# Patient Record
Sex: Female | Born: 1940 | Race: Black or African American | Hispanic: No
Health system: Southern US, Community
[De-identification: ages and names within clinical notes are randomized; demographics above are authoritative.]

## PROBLEM LIST (undated history)

## (undated) DIAGNOSIS — I10 Essential (primary) hypertension: Secondary | ICD-10-CM

## (undated) DIAGNOSIS — R262 Difficulty in walking, not elsewhere classified: Secondary | ICD-10-CM

## (undated) DIAGNOSIS — M1990 Unspecified osteoarthritis, unspecified site: Secondary | ICD-10-CM

## (undated) DIAGNOSIS — M4800 Spinal stenosis, site unspecified: Secondary | ICD-10-CM

---

## 2003-06-23 ENCOUNTER — Ambulatory Visit (HOSPITAL_COMMUNITY): Admission: RE | Admit: 2003-06-23 | Discharge: 2003-06-23 | Payer: Self-pay | Admitting: Geriatric Medicine

## 2005-01-02 ENCOUNTER — Ambulatory Visit (HOSPITAL_COMMUNITY): Admission: RE | Admit: 2005-01-02 | Discharge: 2005-01-02 | Payer: Self-pay | Admitting: Neurosurgery

## 2005-06-02 ENCOUNTER — Emergency Department (HOSPITAL_COMMUNITY): Admission: EM | Admit: 2005-06-02 | Discharge: 2005-06-02 | Payer: Self-pay | Admitting: Family Medicine

## 2005-06-04 ENCOUNTER — Emergency Department (HOSPITAL_COMMUNITY): Admission: EM | Admit: 2005-06-04 | Discharge: 2005-06-04 | Payer: Self-pay | Admitting: Emergency Medicine

## 2010-06-24 ENCOUNTER — Encounter: Admission: RE | Admit: 2010-06-24 | Discharge: 2010-06-24 | Payer: Self-pay | Admitting: Geriatric Medicine

## 2014-04-27 ENCOUNTER — Other Ambulatory Visit: Payer: Self-pay

## 2014-04-27 DIAGNOSIS — Z1231 Encounter for screening mammogram for malignant neoplasm of breast: Secondary | ICD-10-CM

## 2014-05-09 ENCOUNTER — Ambulatory Visit
Admission: RE | Admit: 2014-05-09 | Discharge: 2014-05-09 | Disposition: A | Discharge status: Home / Self Care | Payer: Medicare PPO | Source: Ambulatory Visit

## 2014-05-09 DIAGNOSIS — Z1231 Encounter for screening mammogram for malignant neoplasm of breast: Secondary | ICD-10-CM

## 2014-05-11 ENCOUNTER — Other Ambulatory Visit: Payer: Self-pay | Admitting: Geriatric Medicine

## 2014-05-11 DIAGNOSIS — R928 Other abnormal and inconclusive findings on diagnostic imaging of breast: Secondary | ICD-10-CM

## 2014-05-18 ENCOUNTER — Ambulatory Visit
Admission: RE | Admit: 2014-05-18 | Discharge: 2014-05-18 | Disposition: A | Discharge status: Home / Self Care | Payer: Medicare PPO | Source: Ambulatory Visit | Attending: Geriatric Medicine | Admitting: Geriatric Medicine

## 2014-05-18 DIAGNOSIS — R928 Other abnormal and inconclusive findings on diagnostic imaging of breast: Secondary | ICD-10-CM

## 2014-10-12 ENCOUNTER — Other Ambulatory Visit: Payer: Self-pay | Admitting: Geriatric Medicine

## 2014-10-12 DIAGNOSIS — R921 Mammographic calcification found on diagnostic imaging of breast: Secondary | ICD-10-CM

## 2014-12-29 ENCOUNTER — Ambulatory Visit
Admission: RE | Admit: 2014-12-29 | Discharge: 2014-12-29 | Disposition: A | Discharge status: Home / Self Care | Payer: Medicare PPO | Source: Ambulatory Visit | Attending: Geriatric Medicine | Admitting: Geriatric Medicine

## 2014-12-29 DIAGNOSIS — R921 Mammographic calcification found on diagnostic imaging of breast: Secondary | ICD-10-CM

## 2016-01-15 ENCOUNTER — Other Ambulatory Visit: Payer: Self-pay

## 2016-01-15 DIAGNOSIS — Z1231 Encounter for screening mammogram for malignant neoplasm of breast: Secondary | ICD-10-CM

## 2016-02-18 ENCOUNTER — Ambulatory Visit
Admission: RE | Admit: 2016-02-18 | Discharge: 2016-02-18 | Disposition: A | Discharge status: Home / Self Care | Payer: Medicare PPO | Source: Ambulatory Visit

## 2016-02-18 DIAGNOSIS — Z1231 Encounter for screening mammogram for malignant neoplasm of breast: Secondary | ICD-10-CM

## 2017-02-20 ENCOUNTER — Other Ambulatory Visit: Payer: Self-pay | Admitting: Geriatric Medicine

## 2017-02-20 DIAGNOSIS — Z1231 Encounter for screening mammogram for malignant neoplasm of breast: Secondary | ICD-10-CM

## 2017-03-20 ENCOUNTER — Ambulatory Visit: Payer: Medicare Other

## 2017-04-03 ENCOUNTER — Ambulatory Visit
Admission: RE | Admit: 2017-04-03 | Discharge: 2017-04-03 | Disposition: A | Discharge status: Home / Self Care | Payer: Medicare Other | Source: Ambulatory Visit | Attending: Geriatric Medicine | Admitting: Geriatric Medicine

## 2017-04-03 DIAGNOSIS — Z1231 Encounter for screening mammogram for malignant neoplasm of breast: Secondary | ICD-10-CM

## 2017-09-23 DIAGNOSIS — M792 Neuralgia and neuritis, unspecified: Secondary | ICD-10-CM | POA: Diagnosis present

## 2017-09-23 DIAGNOSIS — Z981 Arthrodesis status: Secondary | ICD-10-CM

## 2018-02-23 ENCOUNTER — Ambulatory Visit: Payer: Medicare Other | Attending: Internal Medicine

## 2018-02-23 ENCOUNTER — Other Ambulatory Visit: Payer: Self-pay

## 2018-02-23 DIAGNOSIS — G8929 Other chronic pain: Secondary | ICD-10-CM | POA: Diagnosis present

## 2018-02-23 DIAGNOSIS — M25662 Stiffness of left knee, not elsewhere classified: Secondary | ICD-10-CM

## 2018-02-23 DIAGNOSIS — M25511 Pain in right shoulder: Secondary | ICD-10-CM | POA: Insufficient documentation

## 2018-02-23 DIAGNOSIS — M545 Low back pain: Secondary | ICD-10-CM | POA: Insufficient documentation

## 2018-02-23 DIAGNOSIS — M6281 Muscle weakness (generalized): Secondary | ICD-10-CM

## 2018-02-23 DIAGNOSIS — M25661 Stiffness of right knee, not elsewhere classified: Secondary | ICD-10-CM

## 2018-02-23 DIAGNOSIS — E669 Obesity, unspecified: Secondary | ICD-10-CM

## 2018-02-23 DIAGNOSIS — M25512 Pain in left shoulder: Secondary | ICD-10-CM | POA: Diagnosis present

## 2018-02-23 NOTE — Therapy (Signed)
[facility] [address] [place], [place], [address] Phone: [phone]   Fax:  [phone]  Physical Therapy Evaluation  Patient Details  Name: Kathleen [name] MRN: [id number] Date of Birth: [birth year] Referring Provider: Eloisa Northern, MD   Encounter Date: 02/23/2018  PT End of Session - 02/23/18 1743    Visit Number  1    Number of Visits  25    Date for PT Re-Evaluation  05/07/18    Authorization Type  [employer] MCR State PLan    PT Start Time  0220    PT Stop Time  0305    PT Time Calculation (min)  45 min    Activity Tolerance  Treatment limited secondary to medical complications (Comment);No increased pain    Behavior During Therapy  [facility] for tasks assessed/performed       No past medical history on file.    There were no vitals filed for this visit.   Subjective Assessment - 02/23/18 1426    Subjective  Reports decompression and spinal fusion. (11/10/2017).   [facility] for surgery.  SNF for REHAB.   Exhausted insurance for skilled SNF surgery., She has not had therapy for a month. Approved for OPPT.    She was not able to walk at facility.  she was able to stand but not walk . Now can get up in bars Indep.  Doing sliding board stransfer now.       Prior to surgery she was not able to walk after 07/2017.   She was using walker priot to that for 10-15 years.     Patient is accompained by:  Family member Daughter    Pertinent History  2014 spinal surgery due to spinal stenosis.  She was living by self withw alker prior to Nov 2018. Shoulder OA , knee OA, Hand tingles    Limitations  Standing;Walking;House hold activities transfers    How long can you sit comfortably?  all day    How long can you stand comfortably?  1 min with SBA    How long can you walk comfortably?  Not able     Patient Stated Goals  to be able to walk again.     Currently in Pain?  Yes no back pain . Pain in legs and feet.     Pain Location  -- LE     Pain  Orientation  Right;Left    Pain Descriptors / Indicators  Tingling    Pain Type  Chronic pain    Pain Radiating Towards  both lateral LE    Pain Onset  More than a month ago    Pain Frequency  Constant    Aggravating Factors   comes and goes.          [facility] PT Assessment - 02/23/18 0001      Assessment   Medical Diagnosis  Post spinal decompression    Referring Provider  Eloisa Northern, MD    Onset Date/Surgical Date  11/12/17    Next MD Visit  05/2018    Prior Therapy  SNF      Precautions   Precautions  Fall      Restrictions   Weight Bearing Restrictions  No      Balance Screen   Has the patient fallen in the past 6 months  No    Has the patient had a decrease in activity level because of a fear of falling?   Yes  Home Environment   Living Environment  Skilled nursing facility      Prior Function   Level of Independence  Needs assistance with ADLs;Requires assistive device for independence;Needs assistance with homemaking;Needs assistance with gait;Needs assistance with transfers      Cognition   Overall Cognitive Status  Within Functional Limits for tasks assessed      ROM / Strength   AROM / PROM / Strength  AROM;Strength;PROM      AROM   Overall AROM Comments   LE movements are less than available ROM with hip motions , knee and ankle able in available ROm ,   all hip and knee ( knee flexion < 90 degrees bilaterally) motions limited by stiffess.  Poor core strength as she is not able to hold herself in erect sitting partly due to abdominal girth.       Strength   Overall Strength Comments  weakness in both LE ( hip < 3+/5 and knees and ankle 4/5)  and core . Shoulders WNL but limtied by stiffness to to OA      Transfers   Comments  Max assist of one for sliding board treansfer to and from [facility] and to scoot back into chair      Ambulation/Gait   Ambulation/Gait  No    Gait Comments  UNABLE      Wheelchair Mobility   Wheelchair Mobility  No      Balance    Balance Assessed  -- has trouble sitting erect without back support                Objective measurements completed on examination: See above findings.              PT Education - 02/23/18 1743    Education provided  Yes    Education Details  POC    Person(s) Educated  Patient;Child(ren)    Methods  Explanation    Comprehension  Verbalized understanding       PT Short Term Goals - 02/23/18 1753      PT SHORT TERM GOAL #1   Title  She will be independent in initial HEP    Time  4    Period  Weeks    Status  New      PT SHORT TERM GOAL #2   Title  She will be able to transfer with board with min to no assistance    Time  4    Period  Weeks    Status  New      PT SHORT TERM GOAL #3   Title  She will be able to pull herself to stand in parallel bars    Time  4    Period  Weeks    Status  New      PT SHORT TERM GOAL #4   Title  She will be able to take 10 step in bars with min to mod assist.     Time  4    Period  Weeks    Status  New      PT SHORT TERM GOAL #5   Title  She will be able to stand for 5 min  in bars    Time  4    Period  Weeks    Status  New      Additional Short Term Goals   Additional Short Term Goals  Yes      PT SHORT TERM GOAL #6   Title  She will have no increase in leg pain    Time  4    Period  Weeks    Status  New        PT Long Term Goals - 02/23/18 1755      PT LONG TERM GOAL #1   Title  She will be able to stand to walker with minor assist of one    Time  10    Period  Weeks    Status  New      PT LONG TERM GOAL #2   Title  She will be able to walk 50 feet with RW for home ambulation with +1 contact guard assist.     Time  10    Period  Weeks    Status  New      PT LONG TERM GOAL #3   Title  She will be able to stand to walker with +1 min assist from sheel chair.     Time  10    Period  Weeks    Status  New      PT LONG TERM GOAL #4   Title  She will be able to get herself in and out of bed with  +1 min assist     Time  10    Period  Weeks    Status  New      PT LONG TERM GOAL #5   Title  she will report no increase in leg pain    Time  10    Period  Weeks    Status  New             Plan - 02/23/18 1744    Clinical Impression Statement  Ms Washing presents post Lumbar decompression and fusion due to spinal stenosis. She has progressed to sliding board stransfer and participates in mobility but still needs assist for all aspects of daily living. She is extremely obese and lives in  skilled facility. She does get assistance from her children.    Due to multipl comorbidities the prognosis fro walking functionally is fair to poor but she has made gains since surgery she should still make some progress in mobility     History and Personal Factors relevant to plan of care:  Spinal fusion 10 years ago, was walking with walker for past 10 years or more. significant OA in both knees and shoulders, chronic bilateral leg pain to feet.  She recieved assistance for home tasks prior to surgery and was not able to walk after 07/2017.     Clinical Presentation  Evolving    Clinical Presentation due to:  imobility following spinal surgery    Clinical Decision Making  Moderate    Rehab Potential  Fair    Clinical Impairments Affecting Rehab Potential  obesity , previous back surgery, significant LE weakness and stiffness of joints.  significnat core weakness    PT Frequency  3x / week    PT Duration  4 weeks then 2x/week for 6 weeks and assess progress for exension    PT Treatment/Interventions  Moist Heat;Therapeutic activities;Patient/family education;Functional mobility training;Therapeutic exercise;Gait training;Manual techniques;Wheelchair mobility training;Passive range of motion;DME Instruction;ADLs/Self Care Home Management    PT Next Visit Plan  Attempt stand at parallel bars, Assess mat mobility. REview prevoious HEP if they have this. Start HEP    Consulted and Agree with Plan of Care   Patient;Family member/caregiver    Family Member Consulted  daughter  Patient will benefit from skilled therapeutic intervention in order to improve the following deficits and impairments:  Pain, Postural dysfunction, Decreased activity tolerance, Decreased endurance, Decreased range of motion, Decreased strength, Decreased balance  Visit Diagnosis: Muscle weakness (generalized)  Obesity, unspecified classification, unspecified obesity type, unspecified whether serious comorbidity present  Stiffness of left knee, not elsewhere classified  Stiffness of right knee, not elsewhere classified  Chronic bilateral low back pain, with sciatica presence unspecified  Chronic right shoulder pain  Left shoulder pain, unspecified chronicity     Problem List There are no active problems to display for this patient.   Caprice Red  PT 02/23/2018, 6:15 PM  [facility] [address] [place], [place], [address] Phone: [phone]   Fax:  [phone]  Name: Kathleen [name] MRN: [id number] Date of Birth: 24-Jul-1941

## 2018-03-01 ENCOUNTER — Ambulatory Visit: Payer: Medicare Other

## 2018-03-02 ENCOUNTER — Ambulatory Visit: Payer: Medicare Other | Attending: Internal Medicine

## 2018-03-02 DIAGNOSIS — M545 Low back pain: Secondary | ICD-10-CM | POA: Insufficient documentation

## 2018-03-02 DIAGNOSIS — M25511 Pain in right shoulder: Secondary | ICD-10-CM | POA: Diagnosis present

## 2018-03-02 DIAGNOSIS — G8929 Other chronic pain: Secondary | ICD-10-CM | POA: Diagnosis present

## 2018-03-02 DIAGNOSIS — M25661 Stiffness of right knee, not elsewhere classified: Secondary | ICD-10-CM | POA: Insufficient documentation

## 2018-03-02 DIAGNOSIS — M6281 Muscle weakness (generalized): Secondary | ICD-10-CM | POA: Diagnosis present

## 2018-03-02 DIAGNOSIS — M25512 Pain in left shoulder: Secondary | ICD-10-CM | POA: Diagnosis present

## 2018-03-02 DIAGNOSIS — E669 Obesity, unspecified: Secondary | ICD-10-CM | POA: Diagnosis present

## 2018-03-02 DIAGNOSIS — M25662 Stiffness of left knee, not elsewhere classified: Secondary | ICD-10-CM | POA: Diagnosis present

## 2018-03-02 NOTE — Therapy (Signed)
[facility] [address] [place], [place], [address] Phone: [phone]   Fax:  [phone]  Physical Therapy Treatment  Patient Details  Name: Kathleen CottonLana [name] MRN: [id number] Date of Birth: 05/28/1941 Referring Provider: Eloisa NorthernSaad Amin, MD   Encounter Date: 03/02/2018  PT End of Session - 03/02/18 1102    Visit Number  2    Number of Visits  25    Date for PT Re-Evaluation  05/07/18    Authorization Type  UHC MCR State PLan    PT Start Time  1100    PT Stop Time  1140    PT Time Calculation (min)  40 min    Activity Tolerance  Treatment limited secondary to medical complications (Comment);No increased pain    Behavior During Therapy  [facility] assessed/performed       History reviewed. No pertinent past medical history.  History reviewed. No pertinent surgical history.  There were no vitals filed for this visit.  Subjective Assessment - 03/02/18 1109    Subjective  No changes since eval.  Feet and legs bilaterally    Patient is accompained by:  Family member son    Currently in Pain?  Yes    Pain Score  4     Pain Location  -- legs and feet    Pain Orientation  Right;Left    Pain Descriptors / Indicators  Tingling    Pain Type  Chronic pain    Pain Onset  More than a month ago    Pain Frequency  Constant                       [facility] Adult PT Treatment/Exercise - 03/02/18 0001      Ambulation/Gait   Pre-Gait Activities  Worked in parallel bars stood with +2 min assist with pulling on bars and stood 50 sec.  Then rested 2 min and stood a second time with lifting /moving hands forward and back x 10 each  in bars,,   Then stood and walked with +1 mod assist /guard 3 feet with cues to keep back erect. She was not able to do this ie both activity together.   then stood with erect posture with lifting feet alt off floor.   (Pended)       We got out of bars and with +2 mod to max asssit she stood and walked  2-3 short steps with walker and +2 assist as she was not able to stand erect and advance feet. She needed +2 assist to descend to seat.          PT Short Term Goals - 02/23/18 1753      PT SHORT TERM GOAL #1   Title  She will be independent in initial HEP    Time  4    Period  Weeks    Status  New      PT SHORT TERM GOAL #2   Title  She will be able to transfer with board with min to no assistance    Time  4    Period  Weeks    Status  New      PT SHORT TERM GOAL #3   Title  She will be able to pull herself to stand in parallel bars    Time  4    Period  Weeks    Status  New      PT SHORT TERM GOAL #4  Title  She will be able to take 10 step in bars with min to mod assist.     Time  4    Period  Weeks    Status  New      PT SHORT TERM GOAL #5   Title  She will be able to stand for 5 min  in bars    Time  4    Period  Weeks    Status  New      Additional Short Term Goals   Additional Short Term Goals  Yes      PT SHORT TERM GOAL #6   Title  She will have no increase in leg pain    Time  4    Period  Weeks    Status  New        PT Long Term Goals - 02/23/18 1755      PT LONG TERM GOAL #1   Title  She will be able to stand to walker with minor assist of one    Time  10    Period  Weeks    Status  New      PT LONG TERM GOAL #2   Title  She will be able to walk 50 feet with RW for home ambulation with +1 contact guard assist.     Time  10    Period  Weeks    Status  New      PT LONG TERM GOAL #3   Title  She will be able to stand to walker with +1 min assist from sheel chair.     Time  10    Period  Weeks    Status  New      PT LONG TERM GOAL #4   Title  She will be able to get herself in and out of bed with +1 min assist     Time  10    Period  Weeks    Status  New      PT LONG TERM GOAL #5   Title  she will report no increase in leg pain    Time  10    Period  Weeks    Status  New            Plan - 03/02/18 1103    Clinical  Impression Statement  Though she is very far from independent ambulation , she did better than expected with walking able to max 5 feet in bars. Her posture and back weakness limit walking and her inability to pull feet under thighs will significantly decrease chance of standing from chair without max assist .     PT Treatment/Interventions  Moist Heat;Therapeutic activities;Patient/family education;Functional mobility training;Therapeutic exercise;Gait training;Manual techniques;Wheelchair mobility training;Passive range of motion;DME Instruction;ADLs/Self Care Home Management    PT Next Visit Plan  parallel bars walkoing and standing and exercise or mat exercies     Consulted and Agree with Plan of Care  Patient;Family member/caregiver    Family Member Consulted  son       Patient will benefit from skilled therapeutic intervention in order to improve the following deficits and impairments:  Pain, Postural dysfunction, Decreased activity tolerance, Decreased endurance, Decreased range of motion, Decreased strength, Decreased balance  Visit Diagnosis: Muscle weakness (generalized)  Obesity, unspecified classification, unspecified obesity type, unspecified whether serious comorbidity present  Stiffness of left knee, not elsewhere classified  Stiffness of right knee, not elsewhere classified  Chronic bilateral low back  pain, with sciatica presence unspecified  Chronic right shoulder pain  Left shoulder pain, unspecified chronicity     Problem List There are no active problems to display for this patient.   Caprice Red  PT 03/02/2018, 1:01 PM  [facility] [address] [place], [place], [address] Phone: [phone]   Fax:  [phone]  Name: Kathleen [name] MRN: [id number] Date of Birth: 1941/04/09

## 2018-03-03 ENCOUNTER — Encounter: Payer: Self-pay | Admitting: Physical Therapy

## 2018-03-03 ENCOUNTER — Ambulatory Visit: Payer: Medicare Other | Admitting: Physical Therapy

## 2018-03-03 DIAGNOSIS — M6281 Muscle weakness (generalized): Secondary | ICD-10-CM | POA: Diagnosis not present

## 2018-03-03 DIAGNOSIS — G8929 Other chronic pain: Secondary | ICD-10-CM

## 2018-03-03 DIAGNOSIS — M25511 Pain in right shoulder: Secondary | ICD-10-CM

## 2018-03-03 DIAGNOSIS — E669 Obesity, unspecified: Secondary | ICD-10-CM

## 2018-03-03 DIAGNOSIS — M25661 Stiffness of right knee, not elsewhere classified: Secondary | ICD-10-CM

## 2018-03-03 DIAGNOSIS — M545 Low back pain: Secondary | ICD-10-CM

## 2018-03-03 DIAGNOSIS — M25662 Stiffness of left knee, not elsewhere classified: Secondary | ICD-10-CM

## 2018-03-03 NOTE — Therapy (Addendum)
[facility] [address] [place], [place], [address] Phone: [phone]   Fax:  [phone]  Physical Therapy Treatment  Patient Details  Name: Kathleen [name] MRN: [id number] Date of Birth: 09/20/1941 Referring Provider: Eloisa NorthernSaad Amin, MD   Encounter Date: 03/03/2018  PT End of Session - 03/03/18 1458    Visit Number  3    Number of Visits  25    Date for PT Re-Evaluation  05/07/18    Authorization Type  UHC MCR State PLan    PT Start Time  1417    PT Stop Time  1457    PT Time Calculation (min)  40 min    Equipment Utilized During Treatment  Gait belt    Activity Tolerance  Patient tolerated treatment well    Behavior During Therapy  [facility] for tasks assessed/performed       History reviewed. No pertinent past medical history.  History reviewed. No pertinent surgical history.  There were no vitals filed for this visit.  Subjective Assessment - 03/03/18 1423    Subjective  "still having about the same as the other day"     Currently in Pain?  Yes    Pain Score  4                        [facility] Adult PT Treatment/Exercise - 03/03/18 1425      Lumbar Exercises: Standing   Other Standing Lumbar Exercises  standing in parallel bars 3 x 2 min  with 4 inch step in chair     Other Standing Lumbar Exercises  standing glute set 1 x 10 in //      Knee/Hip Exercises: Seated   Long Arc Quad  2 sets;10 reps    Long Arc Quad Weight  2 lbs.    Other Seated Knee/Hip Exercises  3 x 10 with red theraband pulling ankles apart    Marching  2 sets;10 reps 2 #    Sit to [employer]  2 sets;5 reps             PT Education - 03/03/18 1458    Education provided  Yes    Education Details  reviewed proper form with getting up and out of a chair using rocking    Person(s) Educated  Patient    Methods  Explanation;Verbal cues;Handout    Comprehension  Verbalized understanding;Verbal cues required       PT Short Term Goals -  02/23/18 1753      PT SHORT TERM GOAL #1   Title  She will be independent in initial HEP    Time  4    Period  Weeks    Status  New      PT SHORT TERM GOAL #2   Title  She will be able to transfer with board with min to no assistance    Time  4    Period  Weeks    Status  New      PT SHORT TERM GOAL #3   Title  She will be able to pull herself to stand in parallel bars    Time  4    Period  Weeks    Status  New      PT SHORT TERM GOAL #4   Title  She will be able to take 10 step in bars with min to mod assist.     Time  4    Period  Weeks    Status  New      PT SHORT TERM GOAL #5   Title  She will be able to stand for 5 min  in bars    Time  4    Period  Weeks    Status  New      Additional Short Term Goals   Additional Short Term Goals  Yes      PT SHORT TERM GOAL #6   Title  She will have no increase in leg pain    Time  4    Period  Weeks    Status  New        PT Long Term Goals - 02/23/18 1755      PT LONG TERM GOAL #1   Title  She will be able to stand to walker with minor assist of one    Time  10    Period  Weeks    Status  New      PT LONG TERM GOAL #2   Title  She will be able to walk 50 feet with RW for home ambulation with +1 contact guard assist.     Time  10    Period  Weeks    Status  New      PT LONG TERM GOAL #3   Title  She will be able to stand to walker with +1 min assist from sheel chair.     Time  10    Period  Weeks    Status  New      PT LONG TERM GOAL #4   Title  She will be able to get herself in and out of bed with +1 min assist     Time  10    Period  Weeks    Status  New      PT LONG TERM GOAL #5   Title  she will report no increase in leg pain    Time  10    Period  Weeks    Status  New            Plan - 03/03/18 1459    Clinical Impression Statement  pt reported no changes from yesterday. worked on seated strengthening which she required tactile cues for proper exercise form. practiced sit to stand in //  which she perfoemd +2 max assist. pt was only able to stand in // bars and fatigues quickly. uitlized 4 inch step in w/c to promote ease of sit to stand transition. no changes in pain noted at end of session.     PT Next Visit Plan  parallel bars walkoing and standing and exercise or mat exercies     Consulted and Agree with Plan of Care  Patient;Family member/caregiver    Family Member Consulted  son       Patient will benefit from skilled therapeutic intervention in order to improve the following deficits and impairments:  Pain, Postural dysfunction, Decreased activity tolerance, Decreased endurance, Decreased range of motion, Decreased strength, Decreased balance  Visit Diagnosis: Muscle weakness (generalized)  Obesity, unspecified classification, unspecified obesity type, unspecified whether serious comorbidity present  Stiffness of left knee, not elsewhere classified  Stiffness of right knee, not elsewhere classified  Chronic bilateral low back pain, with sciatica presence unspecified  Chronic right shoulder pain     Problem List There are no active problems to display for this patient.  Janann Boeve PT, DPT, LAT, ATC  03/03/18  4:08  PM      [facility] [address] [place], [place], [address] Phone: [phone]   Fax:  [phone]  Name: Kathleen [name] MRN: [id number] Date of Birth: 07-Jan-1941

## 2018-03-03 NOTE — Patient Instructions (Signed)
Perform seated rocking forward and back 2 x 10  Perform at least 1 x a day.

## 2018-03-08 ENCOUNTER — Ambulatory Visit: Payer: Medicare Other

## 2018-03-08 DIAGNOSIS — M25661 Stiffness of right knee, not elsewhere classified: Secondary | ICD-10-CM

## 2018-03-08 DIAGNOSIS — M25512 Pain in left shoulder: Secondary | ICD-10-CM

## 2018-03-08 DIAGNOSIS — M25511 Pain in right shoulder: Secondary | ICD-10-CM

## 2018-03-08 DIAGNOSIS — M6281 Muscle weakness (generalized): Secondary | ICD-10-CM

## 2018-03-08 DIAGNOSIS — E669 Obesity, unspecified: Secondary | ICD-10-CM

## 2018-03-08 DIAGNOSIS — M25662 Stiffness of left knee, not elsewhere classified: Secondary | ICD-10-CM

## 2018-03-08 DIAGNOSIS — M545 Low back pain: Secondary | ICD-10-CM

## 2018-03-08 DIAGNOSIS — G8929 Other chronic pain: Secondary | ICD-10-CM

## 2018-03-08 NOTE — Therapy (Signed)
[facility] [address] [place], [place], [address] Phone: [phone]   Fax:  [phone]  Physical Therapy Treatment  Patient Details  Name: Kathleen CottonLana [name] MRN: [id number] Date of Birth: 02/07/1941 Referring Provider: Eloisa NorthernSaad Amin, MD   Encounter Date: 03/08/2018  PT End of Session - 03/08/18 1417    Visit Number  4    Number of Visits  25    Date for PT Re-Evaluation  05/07/18    Authorization Type  [employer] MCR State PLan    PT Start Time  1230    PT Stop Time  1315    PT Time Calculation (min)  45 min    Equipment Utilized During Treatment  Gait belt    Activity Tolerance  Patient tolerated treatment well    Behavior During Therapy  [facility]/per[facility]       History reviewed. No pertinent past medical history.  History reviewed. No pertinent surgical history.  There were no vitals filed for this visit.  Subjective Assessment - 03/08/18 1340    Subjective  No changes    Pain Score  4     Pain Location  -- legs and feet    Pain Orientation  Right;Left    Pain Descriptors / Indicators  Tingling    Pain Type  Chronic pain    Pain Onset  More than a month ago    Pain Frequency  Constant                       [facility] Adult PT Treatment/Exercise - 03/08/18 0001      Lumbar Exercises: Standing   Other Standing Lumbar Exercises  standing in parallel bars 3 x 1 min  3 inch foam in seat   needed onlt +! assist  then walked 10 feet x 2 in bars with +! min assist and cued for posture and effort. She then stood and worked on weight shift RT and LT  with good posture/      Other Standing Lumbar Exercises  glut squeeze and back extension               PT Short Term Goals - 03/08/18 1419      PT SHORT TERM GOAL #1   Title  She will be independent in initial HEP    Status  On-going      PT SHORT TERM GOAL #2   Title  She will be able to transfer with board with min to no assistance    Status   On-going      PT SHORT TERM GOAL #3   Title  She will be able to pull herself to stand in parallel bars    Status  Achieved      PT SHORT TERM GOAL #4   Title  She will be able to take 10 step in bars with min to mod assist.     Status  Achieved      PT SHORT TERM GOAL #5   Title  She will be able to stand for 5 min  in bars    Status  On-going      PT SHORT TERM GOAL #6   Title  She will have no increase in leg pain    Status  On-going        PT Long Term Goals - 02/23/18 1755      PT LONG TERM GOAL #1   Title  She  will be able to stand to walker with minor assist of one    Time  10    Period  Weeks    Status  New      PT LONG TERM GOAL #2   Title  She will be able to walk 50 feet with RW for home ambulation with +1 contact guard assist.     Time  10    Period  Weeks    Status  New      PT LONG TERM GOAL #3   Title  She will be able to stand to walker with +1 min assist from sheel chair.     Time  10    Period  Weeks    Status  New      PT LONG TERM GOAL #4   Title  She will be able to get herself in and out of bed with +1 min assist     Time  10    Period  Weeks    Status  New      PT LONG TERM GOAL #5   Title  she will report no increase in leg pain    Time  10    Period  Weeks    Status  New            Plan - 03/08/18 1343    Clinical Impression Statement  Elevation of seat aides in sit to stand . She appeared to stand more erect for longer period today. Inmproved walking. Weakness on Rt side makes moving LT leg forward difficult    Clinical Impairments Affecting Rehab Potential  obesity , previous back surgery, significant LE weakness and stiffness of joints.  significnat core weakness    PT Treatment/Interventions  Moist Heat;Therapeutic activities;Patient/family education;Functional mobility training;Therapeutic exercise;Gait training;Manual techniques;Wheelchair mobility training;Passive range of motion;DME Instruction;ADLs/Self Care Home  Management    PT Next Visit Plan  parallel bars walking and standing and exercise or mat exercies     Consulted and Agree with Plan of Care  Patient;Family member/caregiver    Family Member Consulted  son       Patient will benefit from skilled therapeutic intervention in order to improve the following deficits and impairments:  Pain, Postural dysfunction, Decreased activity tolerance, Decreased endurance, Decreased range of motion, Decreased strength, Decreased balance  Visit Diagnosis: Muscle weakness (generalized)  Obesity, unspecified classification, unspecified obesity type, unspecified whether serious comorbidity present  Stiffness of left knee, not elsewhere classified  Stiffness of right knee, not elsewhere classified  Chronic bilateral low back pain, with sciatica presence unspecified  Chronic right shoulder pain  Left shoulder pain, unspecified chronicity     Problem List There are no active problems to display for this patient.   Caprice Red  PT 03/08/2018, 2:20 PM  [facility] [address] [place], [place], [address] Phone: [phone]   Fax:  [phone]  Name: Kathleen [name] MRN: [id number] Date of Birth: Feb 10, 1941

## 2018-03-09 ENCOUNTER — Ambulatory Visit: Payer: Medicare Other

## 2018-03-09 DIAGNOSIS — M6281 Muscle weakness (generalized): Secondary | ICD-10-CM | POA: Diagnosis not present

## 2018-03-09 DIAGNOSIS — E669 Obesity, unspecified: Secondary | ICD-10-CM

## 2018-03-09 DIAGNOSIS — M25661 Stiffness of right knee, not elsewhere classified: Secondary | ICD-10-CM

## 2018-03-09 DIAGNOSIS — M25662 Stiffness of left knee, not elsewhere classified: Secondary | ICD-10-CM

## 2018-03-09 DIAGNOSIS — M545 Low back pain: Secondary | ICD-10-CM

## 2018-03-09 DIAGNOSIS — G8929 Other chronic pain: Secondary | ICD-10-CM

## 2018-03-09 NOTE — Therapy (Signed)
[facility] [address] [place], [place], [address] Phone: [phone]   Fax:  [phone]  Physical Therapy Treatment  Patient Details  Name: Kathleen [name] MRN: [id number] Date of Birth: 1941-06-02 Referring Provider: Eloisa Northern, MD   Encounter Date: 03/09/2018  PT End of Session - 03/09/18 1411    Visit Number  5    Number of Visits  25    Date for PT Re-Evaluation  05/07/18    Authorization Type  U[employer] MCR State PLan    PT Start Time  0212    PT Stop Time  0300    PT Time Calculation (min)  48 min    Equipment Utilized During Treatment  Gait belt    Activity Tolerance  Patient tolerated treatment well;Patient limited by pain    Behavior During Therapy  [facility] for tasks assessed/performed       History reviewed. No pertinent past medical history.  History reviewed. No pertinent surgical history.  There were no vitals filed for this visit.  Subjective Assessment - 03/09/18 1412    Subjective  She report she felt tired after last session   Sore all over later.  Feel Ok today    Currently in Pain?  Yes    Pain Score  4     Pain Location  -- legs    Pain Orientation  Right;Left    Pain Descriptors / Indicators  Tingling    Pain Type  Chronic pain    Pain Radiating Towards  both LE     Pain Onset  More than a month ago    Pain Frequency  Constant    Aggravating Factors   comes and goes    Pain Relieving Factors  None, rest                       [facility] Adult PT Treatment/Exercise - 03/09/18 0001      Ambulation/Gait   Pre-Gait Activities  Worked on standing and arm lifts , walking in bars , standing erect , getting up from chair with  arms from arm rest and she was able to do this with moving feet back and then grabbing the walker. She walked 5 feet then 13 feet wiuth RW and +1 min /CGA. Marland Kitchen                  PT Short Term Goals - 03/08/18 1419      PT SHORT TERM GOAL #1   Title  She will be  independent in initial HEP    Status  On-going      PT SHORT TERM GOAL #2   Title  She will be able to transfer with board with min to no assistance    Status  On-going      PT SHORT TERM GOAL #3   Title  She will be able to pull herself to stand in parallel bars    Status  Achieved      PT SHORT TERM GOAL #4   Title  She will be able to take 10 step in bars with min to mod assist.     Status  Achieved      PT SHORT TERM GOAL #5   Title  She will be able to stand for 5 min  in bars    Status  On-going      PT SHORT TERM GOAL #6   Title  She will have no increase  in leg pain    Status  On-going        PT Long Term Goals - 02/23/18 1755      PT LONG TERM GOAL #1   Title  She will be able to stand to walker with minor assist of one    Time  10    Period  Weeks    Status  New      PT LONG TERM GOAL #2   Title  She will be able to walk 50 feet with RW for home ambulation with +1 contact guard assist.     Time  10    Period  Weeks    Status  New      PT LONG TERM GOAL #3   Title  She will be able to stand to walker with +1 min assist from sheel chair.     Time  10    Period  Weeks    Status  New      PT LONG TERM GOAL #4   Title  She will be able to get herself in and out of bed with +1 min assist     Time  10    Period  Weeks    Status  New      PT LONG TERM GOAL #5   Title  she will report no increase in leg pain    Time  10    Period  Weeks    Status  New            Plan - 03/09/18 1412    Clinical Impression Statement  She has made some good improvements with min to no assist to rise from chair and walking >10 feet but step with LT leg lags RT prob due to RT side weakness.     Clinical Impairments Affecting Rehab Potential  obesity , previous back surgery, significant LE weakness and stiffness of joints.  significnat core weakness    PT Treatment/Interventions  Moist Heat;Therapeutic activities;Patient/family education;Functional mobility  training;Therapeutic exercise;Gait training;Manual techniques;Wheelchair mobility training;Passive range of motion;DME Instruction;ADLs/Self Care Home Management    PT Next Visit Plan  parallel bars walking and standing and exercise or mat exercies     Consulted and Agree with Plan of Care  Patient;Family member/caregiver    Family Member Consulted  Daughter       Patient will benefit from skilled therapeutic intervention in order to improve the following deficits and impairments:  Pain, Postural dysfunction, Decreased activity tolerance, Decreased endurance, Decreased range of motion, Decreased strength, Decreased balance  Visit Diagnosis: Muscle weakness (generalized)  Obesity, unspecified classification, unspecified obesity type, unspecified whether serious comorbidity present  Stiffness of left knee, not elsewhere classified  Stiffness of right knee, not elsewhere classified  Chronic bilateral low back pain, with sciatica presence unspecified     Problem List There are no active problems to display for this patient.   Caprice RedChasse, Tritia Endo M  PT 03/09/2018, 3:04 PM  [facility] [address] [place], [place], [address] Phone: [phone]   Fax:  [phone]  Name: Kathleen [name] MRN: [id number] Date of Birth: 05/06/1941

## 2018-03-11 ENCOUNTER — Ambulatory Visit: Payer: Medicare Other

## 2018-03-15 ENCOUNTER — Ambulatory Visit: Payer: Medicare Other

## 2018-03-16 ENCOUNTER — Ambulatory Visit: Payer: Medicare Other

## 2018-03-16 DIAGNOSIS — M6281 Muscle weakness (generalized): Secondary | ICD-10-CM

## 2018-03-16 DIAGNOSIS — G8929 Other chronic pain: Secondary | ICD-10-CM

## 2018-03-16 DIAGNOSIS — E669 Obesity, unspecified: Secondary | ICD-10-CM

## 2018-03-16 DIAGNOSIS — M25662 Stiffness of left knee, not elsewhere classified: Secondary | ICD-10-CM

## 2018-03-16 DIAGNOSIS — M545 Low back pain: Secondary | ICD-10-CM

## 2018-03-16 DIAGNOSIS — M25661 Stiffness of right knee, not elsewhere classified: Secondary | ICD-10-CM

## 2018-03-16 NOTE — Therapy (Signed)
[place], [place], [address] Phone: [phone]   Fax:  [phone]  Physical Therapy Treatment  Patient Details  Name: Kathleen [name] MRN: [id number] Date of Birth: September 25, 1941 Referring Provider: Garwin Brothers, MD   Encounter Date: 03/16/2018  PT End of Session - 03/16/18 0823    Visit Number  6    Number of Visits  25    Date for PT Re-Evaluation  05/07/18    Authorization Type  [employer] MCR State PLan    PT Start Time  0820    PT Stop Time  0905    PT Time Calculation (min)  45 min    Equipment Utilized During Treatment  Gait belt    Activity Tolerance  Patient tolerated treatment well;Patient limited by pain    Behavior During Therapy  [facility] for tasks assessed/performed       History reviewed. No pertinent past medical history.  History reviewed. No pertinent surgical history.  There were no vitals filed for this visit.  Subjective Assessment - 03/16/18 0922    Subjective  No worse . No changes since last session.     Patient is accompained by:  Family member    Currently in Pain?  Yes    Pain Score  4     Pain Location  -- both LE    Pain Orientation  Right;Left    Pain Descriptors / Indicators  Tingling    Pain Type  Chronic pain    Pain Onset  More than a month ago    Pain Frequency  Constant    Aggravating Factors   eases and worsens without specific cause    Pain Relieving Factors  rest                       [facility] Adult PT Treatment/Exercise - 03/16/18 0001      Ambulation/Gait   Ambulation Distance (Feet)  15 Feet    Assistive device  Rolling walker    Gait Pattern  -- decr stride and wide BOS flexed trunk    Ambulation Surface  Level;Indoor    Gait Comments  +1 CGA .  She reported knee pain so walk stoipped . She also included and LT turn in 15 feet.       Therapeutic Activites    Therapeutic Activities  Other Therapeutic Activities    Other Therapeutic Activities  We  worked scooting on edge of bed. She needed verbal and tactile cues for options to make this as effective as able . Her girth ,stiffness of knees and hips and arm length and shoulder issues make t. Table height was modified up or down to assist with getting in and out of [place] very difficult      Lumbar Exercises: Seated   Long Arc Quad on Chair  Right;Left;2 sets;10 reps;Weights    LAQ on Chair Weights (lbs)  3    Other Seated Lumbar Exercises  arching x 15 RT and LT               PT Short Term Goals - 03/16/18 0919      PT SHORT TERM GOAL #1   Title  She will be independent in initial HEP    Status  On-going      PT SHORT TERM GOAL #2   Title  She will be able to transfer with board with min to no assistance    Baseline  Did  without assistance  but excess time and effort,  no board    Status  Partially Met      PT SHORT TERM GOAL #3   Title  She will be able to pull herself to stand in parallel bars    Status  Achieved      PT SHORT TERM GOAL #4   Title  She will be able to take 10 step in bars with min to mod assist.       PT SHORT TERM GOAL #5   Title  She will be able to stand for 5 min  in bars    Status  On-going      PT SHORT TERM GOAL #6   Title  She will have no increase in leg pain    Baseline  only reported knee pain no incr in leg pain    Status  Achieved        PT Long Term Goals - 02/23/18 1755      PT LONG TERM GOAL #1   Title  She will be able to stand to walker with minor assist of one    Time  10    Period  Weeks    Status  New      PT LONG TERM GOAL #2   Title  She will be able to walk 50 feet with RW for home ambulation with +1 contact guard assist.     Time  10    Period  Weeks    Status  New      PT LONG TERM GOAL #3   Title  She will be able to stand to walker with +1 min assist from sheel chair.     Time  10    Period  Weeks    Status  New      PT LONG TERM GOAL #4   Title  She will be able to get herself in and out of  bed with +1 min assist     Time  10    Period  Weeks    Status  New      PT LONG TERM GOAL #5   Title  she will report no increase in leg pain    Time  10    Period  Weeks    Status  New            Plan - 03/16/18 0824    Clinical Impression Statement  She was able to improve scooting and transfers but she has multiple factors that make this very difficult of impossible for her to do effeciently without assistance    PT Treatment/Interventions  Moist Heat;Therapeutic activities;Patient/family education;Functional mobility training;Therapeutic exercise;Gait training;Manual techniques;Wheelchair mobility training;Passive range of motion;DME Instruction;ADLs/Self Care Home Management    PT Next Visit Plan  parallel bars walking and standing and exercise, transfers or mat exercies     Consulted and Agree with Plan of Care  Patient    Family Member Consulted  son       Patient will benefit from skilled therapeutic intervention in order to improve the following deficits and impairments:  Pain, Postural dysfunction, Decreased activity tolerance, Decreased endurance, Decreased range of motion, Decreased strength, Decreased balance  Visit Diagnosis: Muscle weakness (generalized)  Obesity, unspecified classification, unspecified obesity type, unspecified whether serious comorbidity present  Stiffness of left knee, not elsewhere classified  Stiffness of right knee, not elsewhere classified  Chronic bilateral low back pain, with sciatica presence unspecified  Problem List There are no active problems to display for this patient.   [name]  PT 03/16/2018, 9:23 AM  [facility] [address] [place], [place], [address] Phone: [phone]   Fax:  [phone]  Name: Kathleen [name] MRN: [id number] Date of Birth: March 14, 1941

## 2018-03-18 ENCOUNTER — Ambulatory Visit: Payer: Medicare Other

## 2018-03-18 DIAGNOSIS — E669 Obesity, unspecified: Secondary | ICD-10-CM

## 2018-03-18 DIAGNOSIS — M6281 Muscle weakness (generalized): Secondary | ICD-10-CM | POA: Diagnosis not present

## 2018-03-18 DIAGNOSIS — M25662 Stiffness of left knee, not elsewhere classified: Secondary | ICD-10-CM

## 2018-03-18 DIAGNOSIS — G8929 Other chronic pain: Secondary | ICD-10-CM

## 2018-03-18 DIAGNOSIS — M545 Low back pain: Secondary | ICD-10-CM

## 2018-03-18 DIAGNOSIS — M25661 Stiffness of right knee, not elsewhere classified: Secondary | ICD-10-CM

## 2018-03-18 NOTE — Therapy (Signed)
[place], [place], [address] Phone: [phone]   Fax:  [phone]  Physical Therapy Treatment  Patient Details  Name: Kathleen [name] MRN: [id number] Date of Birth: 07-19-1941 Referring Provider: Garwin Brothers, MD   Encounter Date: 03/18/2018  PT End of Session - 03/18/18 1124    Visit Number  7    Number of Visits  25    Date for PT Re-Evaluation  05/07/18    Authorization Type  [employer]HC MCR State PLan    PT Start Time  1130    PT Stop Time  1215    PT Time Calculation (min)  45 min    Activity Tolerance  Patient tolerated treatment well;Patient limited by pain    Behavior During Therapy  [facility] for tasks assessed/performed       History reviewed. No pertinent past medical history.  History reviewed. No pertinent surgical history.  There were no vitals filed for this visit.  Subjective Assessment - 03/18/18 1224    Subjective  Doing well. Reports more effort with getting up to stand today    Patient is accompained by:  Family member    Currently in Pain?  Yes    Pain Score  4     Pain Location  -- LE's    Pain Orientation  Right;Left    Pain Descriptors / Indicators  Aching;Tightness    Pain Type  Chronic pain    Pain Radiating Towards  both LE    Pain Onset  More than a month ago    Pain Frequency  Constant                       [facility] Adult PT Treatment/Exercise - 03/18/18 0001      Ambulation/Gait   Assistive device  Rolling walker    Ambulation Surface  Level;Indoor    Pre-Gait Activities  Worked on stepping forward and back RT and LT leg and marching in place 10-12 reps  with cues to weight shift LT and RT farther to allow foot clearance     Gait Comments  Bars x 1 trip then with rolling walker. 3 walks with walker 10-12 -18 feet .  step length was improved in early steps of last walk.  Cued for shifting weight to Rt to clear LT foot. Then last walk she walked   feet and did RT  turn toward exit.              PT Education - 03/18/18 1226    Education provided  Yes    Education Details  shifting weight to side and pushing with arms on walker to dlear leg    Person(s) Educated  Patient;Child(ren)    Methods  Explanation    Comprehension  Verbalized understanding;Returned demonstration       PT Short Term Goals - 03/16/18 0919      PT SHORT TERM GOAL #1   Title  She will be independent in initial HEP    Status  On-going      PT SHORT TERM GOAL #2   Title  She will be able to transfer with board with min to no assistance    Baseline  Did without assistance  but excess time and effort,  no board    Status  Partially Met      PT SHORT TERM GOAL #3   Title  She will be able to pull herself to stand in  parallel bars    Status  Achieved      PT SHORT TERM GOAL #4   Title  She will be able to take 10 step in bars with min to mod assist.       PT SHORT TERM GOAL #5   Title  She will be able to stand for 5 min  in bars    Status  On-going      PT SHORT TERM GOAL #6   Title  She will have no increase in leg pain    Baseline  only reported knee pain no incr in leg pain    Status  Achieved        PT Long Term Goals - 02/23/18 1755      PT LONG TERM GOAL #1   Title  She will be able to stand to walker with minor assist of one    Time  10    Period  Weeks    Status  New      PT LONG TERM GOAL #2   Title  She will be able to walk 50 feet with RW for home ambulation with +1 contact guard assist.     Time  10    Period  Weeks    Status  New      PT LONG TERM GOAL #3   Title  She will be able to stand to walker with +1 min assist from sheel chair.     Time  10    Period  Weeks    Status  New      PT LONG TERM GOAL #4   Title  She will be able to get herself in and out of bed with +1 min assist     Time  10    Period  Weeks    Status  New      PT LONG TERM GOAL #5   Title  she will report no increase in leg pain    Time  10    Period   Weeks    Status  New            Plan - 03/18/18 1124    Clinical Impression Statement  Had some more difficulty with going sit to stand from chair but still able to do with CGA.  Walked 18 feet max today  with better step length LT foot with walker.       Clinical Impairments Affecting Rehab Potential  obesity , previous back surgery, significant LE weakness and stiffness of joints.  significnat core weakness    PT Treatment/Interventions  Moist Heat;Therapeutic activities;Patient/family education;Functional mobility training;Therapeutic exercise;Gait training;Manual techniques;Wheelchair mobility training;Passive range of motion;DME Instruction;ADLs/Self Care Home Management    PT Next Visit Plan  parallel bars walking and standing and exercise, transfers or mat exercies  work on weight shift and  push with arms to clear L:T leg to step    Consulted and Agree with Plan of Care  Patient    Family Member Consulted  son       Patient will benefit from skilled therapeutic intervention in order to improve the following deficits and impairments:  Pain, Postural dysfunction, Decreased activity tolerance, Decreased endurance, Decreased range of motion, Decreased strength, Decreased balance  Visit Diagnosis: Muscle weakness (generalized)  Obesity, unspecified classification, unspecified obesity type, unspecified whether serious comorbidity present  Stiffness of left knee, not elsewhere classified  Stiffness of right knee, not elsewhere classified  Chronic bilateral low back  pain, with sciatica presence unspecified     Problem List There are no active problems to display for this patient.   [name] PT 03/18/2018, 12:26 PM  [facility] [address] [place], [place], [address] Phone: [phone]   Fax:  [phone]  Name: Azadeh Hyder MRN: [id number] Date of Birth: Aug 24, 1941

## 2018-03-22 ENCOUNTER — Ambulatory Visit: Payer: Medicare Other

## 2018-03-22 DIAGNOSIS — G8929 Other chronic pain: Secondary | ICD-10-CM

## 2018-03-22 DIAGNOSIS — M6281 Muscle weakness (generalized): Secondary | ICD-10-CM

## 2018-03-22 DIAGNOSIS — M545 Low back pain: Secondary | ICD-10-CM

## 2018-03-22 DIAGNOSIS — M25661 Stiffness of right knee, not elsewhere classified: Secondary | ICD-10-CM

## 2018-03-22 DIAGNOSIS — M25662 Stiffness of left knee, not elsewhere classified: Secondary | ICD-10-CM

## 2018-03-22 DIAGNOSIS — E669 Obesity, unspecified: Secondary | ICD-10-CM

## 2018-03-22 NOTE — Therapy (Signed)
[place], [place], [address] Phone: [phone]   Fax:  [phone]  Physical Therapy Treatment  Patient Details  Name: Kathleen [name] MRN: [id number] Date of Birth: May 20, 1941 Referring Provider: Garwin Brothers, MD   Encounter Date: 03/22/2018  PT End of Session - 03/22/18 1348    Visit Number  8    Number of Visits  25    Date for PT Re-Evaluation  05/07/18    Authorization Type  [employer] [place] at visit 15  progress visit 10    PT Start Time  0155    PT Stop Time  0235    PT Time Calculation (min)  40 min    Equipment Utilized During Treatment  Gait belt    Activity Tolerance  Patient tolerated treatment well;Patient limited by pain       History reviewed. No pertinent past medical history.  History reviewed. No pertinent surgical history.  There were no vitals filed for this visit.  Subjective Assessment - 03/22/18 1444    Subjective  Shoulder pain bad today.  Makes movement  harder.     Currently in Pain?  Yes    Pain Score  5     Pain Location  Shoulder    Pain Orientation  Left    Pain Type  Chronic pain    Pain Onset  More than a month ago    Pain Frequency  Constant    Multiple Pain Sites  -- LLBP 4/10 at normal level.   RT shoulder pain also 4/10                       [facility] Adult PT Treatment/Exercise - 03/22/18 0001      Therapeutic Activites    Other Therapeutic Activities  We worked scooting on edge of bed to [employer] . She needed verbal and tactile cues for options to make this as effective as able . Her girth ,stiffness of knees and hips and arm length and shoulder issues make t. Table height was modified up or down to assist with getting in and out of [place] very difficult. We also worked on rolling in bed RT and LT and supine to sit with rolling needed +1 moderate to max assist.       Neuro Re-ed    Neuro Re-ed Details   --      Lumbar Exercises:  Supine   Other Supine Lumbar Exercises  Assisted SLR and active hip abduction x 10 each. RT/LT   , then IR/ER x 15 each   then attempt at bridge very short ROM x 15, attempted LE flexion LT > Rt   x 12 rep, , then x15 LT/RT roll ball with legs followed by arms and shoulders Rotate RT/LT x 12.                PT Short Term Goals - 03/22/18 1443      PT SHORT TERM GOAL #1   Title  She will be independent in initial HEP    Status  On-going      PT SHORT TERM GOAL #2   Title  She will be able to transfer with board with min to no assistance    Baseline  Did without assistance  but excess time and effort,  no board    Status  Partially Met  PT SHORT TERM GOAL #3   Title  She will be able to pull herself to stand in parallel bars    Status  Achieved      PT SHORT TERM GOAL #4   Title  She will be able to take 10 step in bars with min to mod assist.     Status  Achieved      PT SHORT TERM GOAL #5   Title  She will be able to stand for 5 min  in bars    Status  On-going      PT SHORT TERM GOAL #6   Title  She will have no increase in leg pain    Status  Achieved        PT Long Term Goals - 02/23/18 1755      PT LONG TERM GOAL #1   Title  She will be able to stand to walker with minor assist of one    Time  10    Period  Weeks    Status  New      PT LONG TERM GOAL #2   Title  She will be able to walk 50 feet with RW for home ambulation with +1 contact guard assist.     Time  10    Period  Weeks    Status  New      PT LONG TERM GOAL #3   Title  She will be able to stand to walker with +1 min assist from sheel chair.     Time  10    Period  Weeks    Status  New      PT LONG TERM GOAL #4   Title  She will be able to get herself in and out of bed with +1 min assist     Time  10    Period  Weeks    Status  New      PT LONG TERM GOAL #5   Title  she will report no increase in leg pain    Time  10    Period  Weeks    Status  New            Plan -  03/22/18 1349    Clinical Impression Statement  She wa able to cooperate with all exercises but with modifications for stiffness of knees and weakness /girth.  Need to issue some HEP for bed but ? how often this will be done in faciulity    Clinical Impairments Affecting Rehab Potential  obesity , previous back surgery, significant LE weakness and stiffness of joints.  significnat core weakness    PT Treatment/Interventions  Moist Heat;Therapeutic activities;Patient/family education;Functional mobility training;Therapeutic exercise;Gait training;Manual techniques;Wheelchair mobility training;Passive range of motion;DME Instruction;ADLs/Self Care Home Management    PT Next Visit Plan  parallel bars walking and standing and exercise, transfers or mat exercies  work on weight shift and  push with arms to clear L:T leg to step                  Mat exercises for HEP     Consulted and Agree with Plan of Care  Patient       Patient will benefit from skilled therapeutic intervention in order to improve the following deficits and impairments:  Pain, Postural dysfunction, Decreased activity tolerance, Decreased endurance, Decreased range of motion, Decreased strength, Decreased balance  Visit Diagnosis: Muscle weakness (generalized)  Obesity, unspecified classification, unspecified obesity type, unspecified  whether serious comorbidity present  Stiffness of left knee, not elsewhere classified  Stiffness of right knee, not elsewhere classified  Chronic bilateral low back pain, with sciatica presence unspecified     Problem List There are no active problems to display for this patient.   [name]  PT 03/22/2018, 2:47 PM  [facility] [address] [place], [place], [address] Phone: [phone]   Fax:  [phone]  Name: Jenafer Winterton MRN: [id number] Date of Birth: [birth year]

## 2018-03-25 ENCOUNTER — Ambulatory Visit: Payer: Medicare Other

## 2018-03-25 DIAGNOSIS — M25661 Stiffness of right knee, not elsewhere classified: Secondary | ICD-10-CM

## 2018-03-25 DIAGNOSIS — M6281 Muscle weakness (generalized): Secondary | ICD-10-CM

## 2018-03-25 DIAGNOSIS — M25511 Pain in right shoulder: Secondary | ICD-10-CM

## 2018-03-25 DIAGNOSIS — M545 Low back pain: Secondary | ICD-10-CM

## 2018-03-25 DIAGNOSIS — M25662 Stiffness of left knee, not elsewhere classified: Secondary | ICD-10-CM

## 2018-03-25 DIAGNOSIS — E669 Obesity, unspecified: Secondary | ICD-10-CM

## 2018-03-25 DIAGNOSIS — G8929 Other chronic pain: Secondary | ICD-10-CM

## 2018-03-25 DIAGNOSIS — M25512 Pain in left shoulder: Secondary | ICD-10-CM

## 2018-03-25 NOTE — Therapy (Signed)
[place], [place], [address] Phone: [phone]   Fax:  [phone]  Physical Therapy Treatment  Patient Details  Name: Kathleen [name] MRN: [id number] Date of Birth: 12-Nov-1940 Referring Provider: Garwin Brothers, MD   Encounter Date: 03/25/2018  PT End of Session - 03/25/18 1037    Visit Number  9    Number of Visits  25    Date for PT Re-Evaluation  05/07/18    Authorization Type  [employer] [place] at visit 15  progress visit 10    PT Start Time  1038    PT Stop Time  1130    PT Time Calculation (min)  52 min    Equipment Utilized During Treatment  Gait belt    Activity Tolerance  Patient tolerated treatment well;Patient limited by pain    Behavior During Therapy  [facility] for tasks assessed/performed       History reviewed. No pertinent past medical history.  History reviewed. No pertinent surgical history.  There were no vitals filed for this visit.  Subjective Assessment - 03/25/18 1049    Subjective  No complaints.     Currently in Pain?  Yes    Pain Score  4     Pain Location  Shoulder    Pain Orientation  Left    Pain Descriptors / Indicators  Aching;Tightness    Pain Type  Chronic pain    Pain Radiating Towards  both LE also     Pain Onset  More than a month ago    Pain Frequency  Constant    Aggravating Factors   better or worse variewsa per day and activity    Pain Relieving Factors  rest                       [facility] Adult PT Treatment/Exercise - 03/25/18 0001      Ambulation/Gait   Ambulation Distance (Feet)  15 Feet    Assistive device  Rolling walker    Ambulation Surface  Level;Indoor    Gait Comments  Walked half way down bar then turned LT and walked back to [facility] with +2 CGA , then walked 1/2 way down and side stepped to Lt to chair and sat,, then walked RT sideway to end of bars and chair brought for her to sit.   HR was 145 max post walks and BP was  175/105 max. after 1-2 min rest HR was 98 and BP 145/84  Walked 15 feet with walker with verbal cuing to stand tall and to push on her arms to maximize lift to clear feet     Therapeutic Activites    Other Therapeutic Activities  Stood for 90 sec then had to sit . Better at getting straight  but does not last more than 5-8 sec.                PT Short Term Goals - 03/22/18 1443      PT SHORT TERM GOAL #1   Title  She will be independent in initial HEP    Status  On-going      PT SHORT TERM GOAL #2   Title  She will be able to transfer with board with min to no assistance    Baseline  Did without assistance  but excess time and effort,  no board    Status  Partially Met      PT SHORT TERM GOAL #3   Title  She will be able to pull herself to stand in parallel bars    Status  Achieved      PT SHORT TERM GOAL #4   Title  She will be able to take 10 step in bars with min to mod assist.     Status  Achieved      PT SHORT TERM GOAL #5   Title  She will be able to stand for 5 min  in bars    Status  On-going      PT SHORT TERM GOAL #6   Title  She will have no increase in leg pain    Status  Achieved        PT Long Term Goals - 02/23/18 1755      PT LONG TERM GOAL #1   Title  She will be able to stand to walker with minor assist of one    Time  10    Period  Weeks    Status  New      PT LONG TERM GOAL #2   Title  She will be able to walk 50 feet with RW for home ambulation with +1 contact guard assist.     Time  10    Period  Weeks    Status  New      PT LONG TERM GOAL #3   Title  She will be able to stand to walker with +1 min assist from sheel chair.     Time  10    Period  Weeks    Status  New      PT LONG TERM GOAL #4   Title  She will be able to get herself in and out of bed with +1 min assist     Time  10    Period  Weeks    Status  New      PT LONG TERM GOAL #5   Title  she will report no increase in leg pain    Time  10    Period  Weeks     Status  New            Plan - 03/25/18 1037    Clinical Impression Statement  She is really doing better to stand erect with walking but still struggles. She does not shift weight to RT enough so stiffness of LT knee and weakness makes advancing LT leg diffiucult. . Not sent vis Ms Zent daughter for facility to see if they cna get MD to order 4 inch foam (firm) seat cushion for WC to help sit to stand and id the cna medicate her for pain prior to coming toPT to see if she can do better with less pain during session    Clinical Impairments Affecting Rehab Potential  obesity , previous back surgery, significant LE weakness and stiffness of joints.  significnat core weakness    PT Treatment/Interventions  Moist Heat;Therapeutic activities;Patient/family education;Functional mobility training;Therapeutic exercise;Gait training;Manual techniques;Wheelchair mobility training;Passive range of motion;DME Instruction;ADLs/Self Care Home Management    PT Next Visit Plan  parallel bars walking and standing and exercise, transfers or mat exercies  work on weight shift and  push with arms to clear L:T leg to step                  Mat exercises for HEP next if family here  Consulted and Agree with Plan of Care  Patient       Patient will benefit from skilled therapeutic intervention in order to improve the following deficits and impairments:  Pain, Postural dysfunction, Decreased activity tolerance, Decreased endurance, Decreased range of motion, Decreased strength, Decreased balance  Visit Diagnosis: Muscle weakness (generalized)  Obesity, unspecified classification, unspecified obesity type, unspecified whether serious comorbidity present  Stiffness of left knee, not elsewhere classified  Stiffness of right knee, not elsewhere classified  Chronic bilateral low back pain, with sciatica presence unspecified  Chronic right shoulder pain  Left shoulder pain, unspecified  chronicity     Problem List There are no active problems to display for this patient.   [name]  PT 03/25/2018, 12:56 PM  [facility] [address] [place], [place], [address] Phone: [phone]   Fax:  [phone]  Name: Kathleen [name] MRN: [id number] Date of Birth: Apr 29, 1941

## 2018-03-29 ENCOUNTER — Ambulatory Visit: Payer: Medicare Other | Attending: Internal Medicine

## 2018-03-29 DIAGNOSIS — G8929 Other chronic pain: Secondary | ICD-10-CM | POA: Insufficient documentation

## 2018-03-29 DIAGNOSIS — M6281 Muscle weakness (generalized): Secondary | ICD-10-CM | POA: Diagnosis not present

## 2018-03-29 DIAGNOSIS — M25512 Pain in left shoulder: Secondary | ICD-10-CM | POA: Insufficient documentation

## 2018-03-29 DIAGNOSIS — E669 Obesity, unspecified: Secondary | ICD-10-CM | POA: Insufficient documentation

## 2018-03-29 DIAGNOSIS — M545 Low back pain: Secondary | ICD-10-CM | POA: Insufficient documentation

## 2018-03-29 DIAGNOSIS — M25661 Stiffness of right knee, not elsewhere classified: Secondary | ICD-10-CM | POA: Insufficient documentation

## 2018-03-29 DIAGNOSIS — M25662 Stiffness of left knee, not elsewhere classified: Secondary | ICD-10-CM | POA: Insufficient documentation

## 2018-03-29 DIAGNOSIS — M25511 Pain in right shoulder: Secondary | ICD-10-CM | POA: Diagnosis present

## 2018-03-29 NOTE — Therapy (Signed)
[place], [place], [address] Phone: [phone]   Fax:  [phone]  Physical Therapy Treatment  Patient Details  Name: Kathleen [name] MRN: [id number] Date of Birth: July 28, 1941 Referring Provider: Garwin Brothers, MD   Encounter Date: 03/29/2018  PT End of Session - 03/29/18 1521    Visit Number  10    Number of Visits  25    Date for PT Re-Evaluation  05/07/18    Authorization Type  [employer] [place] at visit 15  progress visit 10    PT Start Time  0215    PT Stop Time  0300    PT Time Calculation (min)  45 min    Equipment Utilized During Treatment  Gait belt    Activity Tolerance  Patient tolerated treatment well;Patient limited by pain    Behavior During Therapy  [facility] for tasks assessed/performed       No past medical history on file.  No past surgical history on file.  There were no vitals filed for this visit.  Subjective Assessment - 03/29/18 1508    Patient is accompained by:  Family member    Currently in Pain?  Yes    Pain Score  4     Pain Location  -- LE     Pain Orientation  Left;Right    Pain Descriptors / Indicators  Aching;Tightness    Pain Type  Chronic pain    Pain Onset  More than a month ago    Pain Frequency  Constant    Aggravating Factors   varies per day and activity    Pain Relieving Factors  rest                       [facility] Adult PT Treatment/Exercise - 03/29/18 0001      Transfers   Comments  From chair to mat and mat to [facility] with board       Lumbar Exercises: Stretches   Passive Hamstring Stretch  Right;Left;1 rep;60 seconds      Lumbar Exercises: Supine   Bridge  15 reps;3 seconds legs on 2 large bolsters    Straight Leg Raise  10 reps;Limitations    Straight Leg Raises Limitations  assisted     Other Supine Lumbar Exercises  adduction squeezes into ball x 15 2-3 sec each     Practicing rolling RT and LT in bed cued for involving  arms and legs        PT Education - 03/29/18 1521    Education provided  Yes    Education Details  HEP    Person(s) Educated  Patient;Child(ren)    Methods  Explanation;Verbal cues;Handout    Comprehension  Returned demonstration;Verbalized understanding;Need further instruction       PT Short Term Goals - 03/29/18 1546      PT SHORT TERM GOAL #1   Title  She will be independent in initial HEP    Status  On-going      PT SHORT TERM GOAL #2   Title  She will be able to transfer with board with min to no assistance    Baseline  Did without assistance  less excess time and effort,  with board    Status  Partially Met      PT SHORT TERM GOAL #3   Title  She will be able to  pull herself to stand in parallel bars    Status  Achieved      PT SHORT TERM GOAL #4   Title  She will be able to take 10 step in bars with min to mod assist.     Status  Achieved      PT SHORT TERM GOAL #5   Title  She will be able to stand for 5 min  in bars    Status  On-going        PT Long Term Goals - 02/23/18 1755      PT LONG TERM GOAL #1   Title  She will be able to stand to walker with minor assist of one    Time  10    Period  Weeks    Status  New      PT LONG TERM GOAL #2   Title  She will be able to walk 50 feet with RW for home ambulation with +1 contact guard assist.     Time  10    Period  Weeks    Status  New      PT LONG TERM GOAL #3   Title  She will be able to stand to walker with +1 min assist from sheel chair.     Time  10    Period  Weeks    Status  New      PT LONG TERM GOAL #4   Title  She will be able to get herself in and out of bed with +1 min assist     Time  10    Period  Weeks    Status  New      PT LONG TERM GOAL #5   Title  she will report no increase in leg pain    Time  10    Period  Weeks    Status  New            Plan - 03/29/18 1544    Clinical Impression Statement  She was able  with significant effort to transfer self to and from  mat with board.   She was able to do much of LE exercies and rolling. She agreed with daughter to work at home on leg movements and transfer.s    PT Treatment/Interventions  Moist Heat;Therapeutic activities;Patient/family education;Functional mobility training;Therapeutic exercise;Gait training;Manual techniques;Wheelchair mobility training;Passive range of motion;DME Instruction;ADLs/Self Care Home Management    PT Next Visit Plan  parallel bars walking and standing and exercise, transfers or mat exercies  work on weight shift and  push with arms to clear L:T leg to step                  Mat exercises for HEP next if family here    PT Home Exercise Plan  bridge , rolling , hi abduction and SLR with assist    Consulted and Agree with Plan of Care  Patient    Family Member Consulted  daughter       Patient will benefit from skilled therapeutic intervention in order to improve the following deficits and impairments:  Pain, Postural dysfunction, Decreased activity tolerance, Decreased endurance, Decreased range of motion, Decreased strength, Decreased balance  Visit Diagnosis: Muscle weakness (generalized)  Obesity, unspecified classification, unspecified obesity type, unspecified whether serious comorbidity present  Stiffness of left knee, not elsewhere classified  Stiffness of right knee, not elsewhere classified  Chronic bilateral low back pain, with sciatica presence unspecified  Chronic  right shoulder pain  Left shoulder pain, unspecified chronicity     Problem List There are no active problems to display for this patient.   [name]  PT 03/29/2018, 3:48 PM  [facility] [address] [place], [place], [address] Phone: [phone]   Fax:  [phone]  Name: Gerldine Suleiman MRN: [id number] Date of Birth: 11-Sep-1941

## 2018-03-29 NOTE — Patient Instructions (Signed)
Supine leg lift Flexion - Supine    Tighten buttocks and raise straight  Leg as high as able 10-15 ___ times. R epeat with other leg. Do __2_ times per day.  Copyright  VHI. All rights reserved.  Hip Abduction / Adduction: with Extended Knee (Supine)    Bring left leg out to side and return. Keep knee straight. Repeat       10-15____ times per set. Do __1__ sets per session. Do _2___ sessions per day.  http://orth.exer.us/681   Copyright  VHI. All rights reserved.

## 2018-03-30 ENCOUNTER — Ambulatory Visit: Payer: Medicare Other

## 2018-03-30 DIAGNOSIS — M25662 Stiffness of left knee, not elsewhere classified: Secondary | ICD-10-CM

## 2018-03-30 DIAGNOSIS — G8929 Other chronic pain: Secondary | ICD-10-CM

## 2018-03-30 DIAGNOSIS — M25511 Pain in right shoulder: Secondary | ICD-10-CM

## 2018-03-30 DIAGNOSIS — M25661 Stiffness of right knee, not elsewhere classified: Secondary | ICD-10-CM

## 2018-03-30 DIAGNOSIS — M6281 Muscle weakness (generalized): Secondary | ICD-10-CM

## 2018-03-30 DIAGNOSIS — M545 Low back pain: Secondary | ICD-10-CM

## 2018-03-30 DIAGNOSIS — E669 Obesity, unspecified: Secondary | ICD-10-CM

## 2018-03-30 DIAGNOSIS — M25512 Pain in left shoulder: Secondary | ICD-10-CM

## 2018-03-30 NOTE — Therapy (Signed)
[place], [place], [address] Phone: [phone]   Fax:  [phone]  Physical Therapy Treatment  Patient Details  Name: Kathleen [name] MRN: [id number] Date of Birth: 05/02/1941 Referring Provider: Garwin Brothers, MD   Encounter Date: 03/30/2018  PT End of Session - 03/30/18 1405    Visit Number  11    Number of Visits  25    Date for PT Re-Evaluation  05/07/18    Authorization Type  [employer] [place] at visit 15  progress visit 10    PT Start Time  0200    PT Stop Time  0256    PT Time Calculation (min)  56 min    Equipment Utilized During Treatment  Gait belt    Activity Tolerance  Patient tolerated treatment well;Patient limited by pain    Behavior During Therapy  [facility] for tasks assessed/performed       History reviewed. No pertinent past medical history.  History reviewed. No pertinent surgical history.  There were no vitals filed for this visit.  Subjective Assessment - 03/30/18 1413    Subjective  Doing alright today.    Currently in Pain?  Yes    Pain Score  4     Pain Location  -- legs    Pain Orientation  Right;Left;Lower    Pain Descriptors / Indicators  Aching;Tightness    Pain Type  Chronic pain    Pain Onset  More than a month ago    Pain Frequency  Constant    Aggravating Factors   varies    Pain Relieving Factors  rest                       [facility] Adult PT Treatment/Exercise - 03/30/18 0001      Ambulation/Gait   Pre-Gait Activities  stood after standing with bars no assist. REached back each hand to sit     Gait Comments  walked 20 feet in bars with turn to return to chair and turned to sit cued for reach with each hand to chair.  Then  walked with walker 15 feet +1 CGa  no turns , then stood to walker and lifted both hand  off walker x 5 for 1-2 sec.   then walked 12 feet with walker to end sesssion.              PT Education - 03/29/18 1521     Education provided  Yes    Education Details  HEP    Person(s) Educated  Patient;Child(ren)    Methods  Explanation;Verbal cues;Handout    Comprehension  Returned demonstration;Verbalized understanding;Need further instruction       PT Short Term Goals - 03/29/18 1546      PT SHORT TERM GOAL #1   Title  She will be independent in initial HEP    Status  On-going      PT SHORT TERM GOAL #2   Title  She will be able to transfer with board with min to no assistance    Baseline  Did without assistance  less excess time and effort,  with board    Status  Partially Met      PT SHORT TERM GOAL #3   Title  She will be able to pull herself to stand in parallel bars    Status  Achieved  PT SHORT TERM GOAL #4   Title  She will be able to take 10 step in bars with min to mod assist.     Status  Achieved      PT SHORT TERM GOAL #5   Title  She will be able to stand for 5 min  in bars    Status  On-going        PT Long Term Goals - 03/30/18 1429      PT LONG TERM GOAL #1   Title  She will be able to stand to walker with minor assist of one    Baseline  with 4 inches of cushion    Status  Achieved      PT LONG TERM GOAL #2   Title  She will be able to walk 50 feet with RW for home ambulation with +1 contact guard assist.     Baseline  15 feet    Status  On-going      PT LONG TERM GOAL #3   Title  She will be able to stand to walker with +1 min assist from wheel chair.     Status  Achieved      PT LONG TERM GOAL #4   Title  She will be able to get herself in and out of bed with +1 min assist with assist of bed     Status  On-going      PT LONG TERM GOAL #5   Title  she will report no increase in leg pain    Baseline  does not really change    Status  Achieved            Plan - 03/30/18 1405    Clinical Impression Statement  She was able to walk with longer LT step length  for first 15 feet.   She was able to hold both hands off walker  for very short periods.   She was reaching with each hand to sit today.  HR max was 145 BPM    Clinical Impairments Affecting Rehab Potential  obesity , previous back surgery, significant LE weakness and stiffness of joints.  significnat core weakness    PT Treatment/Interventions  Moist Heat;Therapeutic activities;Patient/family education;Functional mobility training;Therapeutic exercise;Gait training;Manual techniques;Wheelchair mobility training;Passive range of motion;DME Instruction;ADLs/Self Care Home Management    PT Next Visit Plan  parallel bars walking and standing and exercise, transfers or mat exercies  work on weight shift and  push with arms to clear LT leg to step                   PT Home Exercise Plan  bridge , rolling , hip  abduction and SLR with assist    Consulted and Agree with Plan of Care  Patient       Patient will benefit from skilled therapeutic intervention in order to improve the following deficits and impairments:  Pain, Postural dysfunction, Decreased activity tolerance, Decreased endurance, Decreased range of motion, Decreased strength, Decreased balance  Visit Diagnosis: Muscle weakness (generalized)  Stiffness of left knee, not elsewhere classified  Stiffness of right knee, not elsewhere classified  Chronic bilateral low back pain, with sciatica presence unspecified  Chronic right shoulder pain  Left shoulder pain, unspecified chronicity  Obesity, unspecified classification, unspecified obesity type, unspecified whether serious comorbidity present     Problem List There are no active problems to display for this patient.   [name]  PT 03/30/2018, 3:02 PM  [place], [place], [address] Phone: [phone]   Fax:  [phone]  Name: Daleyza Gadomski MRN: [id number] Date of Birth: 1940/12/09

## 2018-04-06 ENCOUNTER — Ambulatory Visit: Payer: Medicare Other

## 2018-04-06 DIAGNOSIS — M545 Low back pain: Secondary | ICD-10-CM

## 2018-04-06 DIAGNOSIS — M25662 Stiffness of left knee, not elsewhere classified: Secondary | ICD-10-CM

## 2018-04-06 DIAGNOSIS — G8929 Other chronic pain: Secondary | ICD-10-CM

## 2018-04-06 DIAGNOSIS — M6281 Muscle weakness (generalized): Secondary | ICD-10-CM

## 2018-04-06 DIAGNOSIS — M25661 Stiffness of right knee, not elsewhere classified: Secondary | ICD-10-CM

## 2018-04-06 NOTE — Therapy (Signed)
[place], [place], [address] Phone: [phone]   Fax:  [phone]  Physical Therapy Treatment  Patient Details  Name: Kathleen [name] MRN: [id number] Date of Birth: 1941/03/07 Referring Provider: Garwin Brothers, MD   Encounter Date: 04/06/2018    History reviewed. No pertinent past medical history.  History reviewed. No pertinent surgical history.  There were no vitals filed for this visit.  Subjective Assessment - 04/06/18 1016    Subjective  Not doing as well today,  Have some buttock pain    Limitations  Sitting    Pain Score  4     Pain Location  Buttocks and legswith standing    Pain Orientation  Right;Left;Lower    Pain Descriptors / Indicators  Aching;Sore    Pain Type  Chronic pain    Pain Onset  More than a month ago    Pain Frequency  Constant    Aggravating Factors   legs vary , buttock with standing    Pain Relieving Factors  rest/sit                       [facility] Adult PT Treatment/Exercise - 04/06/18 0001      Ambulation/Gait   Pre-Gait Activities  Stood in bars practicing lifting one and both hands to improve strength and standing tolerance    Gait Comments  walked 20 feet in bars with turn to return to chair and turned to sit cued for reach with each hand to chair.  Then  walked in bars 30 feet +1 CGa  with turn at 5 feet for final 10 feet  then stood to walker and lifted both hand  off bars x 5 for 1-2 sec.   then walked 12 feet with walker to end sesssion.       Lumbar Exercises: Standing   Other Standing Lumbar Exercises  Hip abduction stepping side to side x 8 RT/LT       Lumbar Exercises: Sidelying   Clam  --    Hip Abduction  --               PT Short Term Goals - 03/29/18 1546      PT SHORT TERM GOAL #1   Title  She will be independent in initial HEP    Status  On-going      PT SHORT TERM GOAL #2   Title  She will be able to transfer with board  with min to no assistance    Baseline  Did without assistance  less excess time and effort,  with board    Status  Partially Met      PT SHORT TERM GOAL #3   Title  She will be able to pull herself to stand in parallel bars    Status  Achieved      PT SHORT TERM GOAL #4   Title  She will be able to take 10 step in bars with min to mod assist.     Status  Achieved      PT SHORT TERM GOAL #5   Title  She will be able to stand for 5 min  in bars    Status  On-going        PT Long Term Goals - 03/30/18 1429      PT LONG TERM GOAL #1   Title  She will be able to stand to walker with minor assist of one  Baseline  with 4 inches of cushion    Status  Achieved      PT LONG TERM GOAL #2   Title  She will be able to walk 50 feet with RW for home ambulation with +1 contact guard assist.     Baseline  15 feet    Status  On-going      PT LONG TERM GOAL #3   Title  She will be able to stand to walker with +1 min assist from wheel chair.     Status  Achieved      PT LONG TERM GOAL #4   Title  She will be able to get herself in and out of bed with +1 min assist with assist of bed     Status  On-going      PT LONG TERM GOAL #5   Title  she will report no increase in leg pain    Baseline  does not really change    Status  Achieved            Plan - 04/06/18 0943    Clinical Impression Statement  Steps in bars are definitely longer with better LT foot clearance. Her standing without holding on was better than last session.   She walked farther than before at 30 feet. LT buttock pain limited time on feet      Clinical Impairments Affecting Rehab Potential  obesity , previous back surgery, significant LE weakness and stiffness of joints.  significnat core weakness    PT Treatment/Interventions  Moist Heat;Therapeutic activities;Patient/family education;Functional mobility training;Therapeutic exercise;Gait training;Manual techniques;Wheelchair mobility training;Passive range of  motion;DME Instruction;ADLs/Self Care Home Management    PT Next Visit Plan  MAy go back to mat again with exercies after walking with RW as far as able    PT Home Exercise Plan  bridge , rolling , hip  abduction and SLR with assist    Consulted and Agree with Plan of Care  Patient       Patient will benefit from skilled therapeutic intervention in order to improve the following deficits and impairments:     Visit Diagnosis: Muscle weakness (generalized)  Stiffness of left knee, not elsewhere classified  Stiffness of right knee, not elsewhere classified  Chronic bilateral low back pain, with sciatica presence unspecified     Problem List There are no active problems to display for this patient.   [name]  PT 04/06/2018, 10:18 AM  [facility] [address] [place], [place], [address] Phone: [phone]   Fax:  [phone]  Name: Kathleen [name] MRN: [id number] Date of Birth: 05/29/1941

## 2018-04-08 ENCOUNTER — Other Ambulatory Visit: Payer: Self-pay | Admitting: Geriatric Medicine

## 2018-04-08 DIAGNOSIS — Z1231 Encounter for screening mammogram for malignant neoplasm of breast: Secondary | ICD-10-CM

## 2018-04-09 ENCOUNTER — Ambulatory Visit: Payer: Medicare Other

## 2018-04-09 VITALS — BP 118/72 | HR 82

## 2018-04-09 DIAGNOSIS — M25662 Stiffness of left knee, not elsewhere classified: Secondary | ICD-10-CM

## 2018-04-09 DIAGNOSIS — M25661 Stiffness of right knee, not elsewhere classified: Secondary | ICD-10-CM

## 2018-04-09 DIAGNOSIS — M6281 Muscle weakness (generalized): Secondary | ICD-10-CM | POA: Diagnosis not present

## 2018-04-09 NOTE — Therapy (Signed)
[place], [place], [address] Phone: [phone]   Fax:  [phone]  Physical Therapy Treatment  Patient Details  Name: Kathleen [name] MRN: [id number] Date of Birth: August 14, 1941 Referring Provider: Garwin Brothers, MD   Encounter Date: 04/09/2018  PT End of Session - 04/09/18 1038    Visit Number  12    Number of Visits  25    Date for PT Re-Evaluation  05/07/18    Authorization Type  [employer] [place] at visit 15  progress visit 10    PT Start Time  0925    PT Stop Time  1025    PT Time Calculation (min)  60 min    Equipment Utilized During Treatment  Gait belt    Activity Tolerance  Patient tolerated treatment well;Patient limited by pain    Behavior During Therapy  [facility] for tasks assessed/performed       History reviewed. No pertinent past medical history.  History reviewed. No pertinent surgical history.  Vitals:   04/09/18 0947  BP: 118/72  Pulse: 82    Subjective Assessment - 04/09/18 0943    Subjective  Feels dizzy today.    Patient is accompained by:  Family member    Pain Score  4     Pain Location  Leg    Pain Orientation  Right;Left;Lower    Pain Descriptors / Indicators  Aching    Pain Onset  More than a month ago    Pain Frequency  Constant                       [facility] Adult PT Treatment/Exercise - 04/09/18 0001      Transfers   Comments  From  mat to WC to RT with +1 min to mod assist after she sat to mat fro walker with CGA    Then we worked on transfer from [facility] to toilet needing mod to max assist  and max assist to return to chair. With walker neede only min assist but mod to max assist to stand to walker. The toilet is of standard height and she may have done better with raised toilet seat      Ambulation/Gait   Gait Comments  walked 20 feet in bars with turn to return to chair and turned to sit cued for reach with each hand to chair HR 109.  Then   walked with RW 43 feet +1 CGa   to mat and sat on mat.to end sesssion.                PT Short Term Goals - 03/29/18 1546      PT SHORT TERM GOAL #1   Title  She will be independent in initial HEP    Status  On-going      PT SHORT TERM GOAL #2   Title  She will be able to transfer with board with min to no assistance    Baseline  Did without assistance  less excess time and effort,  with board    Status  Partially Met      PT SHORT TERM GOAL #3   Title  She will be able to pull herself to stand in parallel bars    Status  Achieved      PT SHORT TERM GOAL #4   Title  She will be able to  take 10 step in bars with min to mod assist.     Status  Achieved      PT SHORT TERM GOAL #5   Title  She will be able to stand for 5 min  in bars    Status  On-going        PT Long Term Goals - 03/30/18 1429      PT LONG TERM GOAL #1   Title  She will be able to stand to walker with minor assist of one    Baseline  with 4 inches of cushion    Status  Achieved      PT LONG TERM GOAL #2   Title  She will be able to walk 50 feet with RW for home ambulation with +1 contact guard assist.     Baseline  15 feet    Status  On-going      PT LONG TERM GOAL #3   Title  She will be able to stand to walker with +1 min assist from wheel chair.     Status  Achieved      PT LONG TERM GOAL #4   Title  She will be able to get herself in and out of bed with +1 min assist with assist of bed     Status  On-going      PT LONG TERM GOAL #5   Title  she will report no increase in leg pain    Baseline  does not really change    Status  Achieved            Plan - 04/09/18 1039    Clinical Impression Statement  She was able to wallk farther with good stride today with walker and was able to transfer to [facility] with min assist from mat without change of  height . OVerall she is better . Toilet transfer will need mod to max assist getting up from toilet. Unable to assess if raised toilet set would  be easier    Clinical Impairments Affecting Rehab Potential  obesity , previous back surgery, significant LE weakness and stiffness of joints.  significnat core weakness    PT Treatment/Interventions  Moist Heat;Therapeutic activities;Patient/family education;Functional mobility training;Therapeutic exercise;Gait training;Manual techniques;Wheelchair mobility training;Passive range of motion;DME Instruction;ADLs/Self Care Home Management    PT Next Visit Plan  MAy go back to mat again with exercies after walking with RW as far as able    PT Home Exercise Plan  bridge , rolling , hip  abduction and SLR with assist    Family Member Consulted  daughter       Patient will benefit from skilled therapeutic intervention in order to improve the following deficits and impairments:  Pain, Postural dysfunction, Decreased activity tolerance, Decreased endurance, Decreased range of motion, Decreased strength, Decreased balance  Visit Diagnosis: Muscle weakness (generalized)  Stiffness of left knee, not elsewhere classified  Stiffness of right knee, not elsewhere classified     Problem List There are no active problems to display for this patient.   [name]  PT 04/09/2018, 10:42 AM  [facility] [address] [place], [place], [address] Phone: [phone]   Fax:  [phone]  Name: Jonessa Triplett MRN: [id number] Date of Birth: 12/20/1940

## 2018-04-12 ENCOUNTER — Ambulatory Visit: Payer: Medicare Other | Admitting: Physical Therapy

## 2018-04-12 DIAGNOSIS — M6281 Muscle weakness (generalized): Secondary | ICD-10-CM | POA: Diagnosis not present

## 2018-04-12 DIAGNOSIS — G8929 Other chronic pain: Secondary | ICD-10-CM

## 2018-04-12 DIAGNOSIS — M25662 Stiffness of left knee, not elsewhere classified: Secondary | ICD-10-CM

## 2018-04-12 DIAGNOSIS — M545 Low back pain: Secondary | ICD-10-CM

## 2018-04-12 DIAGNOSIS — M25661 Stiffness of right knee, not elsewhere classified: Secondary | ICD-10-CM

## 2018-04-12 NOTE — Therapy (Signed)
[place], [place], [address] Phone: [phone]   Fax:  [phone]  Physical Therapy Treatment  Patient Details  Name: Kathleen [name] MRN: [id number] Date of Birth: 1940/12/10 Referring Provider: Garwin Brothers, MD   Encounter Date: 04/12/2018  PT End of Session - 04/12/18 1526    Visit Number  13    Number of Visits  25    Date for PT Re-Evaluation  05/07/18    Authorization Type  [employer] [place] at visit 15  progress visit 10    PT Start Time  1505    PT Stop Time  1546    PT Time Calculation (min)  41 min    Equipment Utilized During Treatment  Gait belt    Activity Tolerance  Patient tolerated treatment well;Patient limited by fatigue    Behavior During Therapy  [facility] for tasks assessed/performed       No past medical history on file.  No past surgical history on file.  There were no vitals filed for this visit.  Subjective Assessment - 04/12/18 1506    Subjective  "I feel fine."    Currently in Pain?  Yes    Pain Score  4     Pain Location  Leg    Pain Orientation  Right;Left    Pain Descriptors / Indicators  Aching    Pain Onset  More than a month ago                       [facility] Adult PT Treatment/Exercise - 04/12/18 0001      Transfers   Comments  sit to stand min A from [facility]; multiple attempts to readjust self to back of WC seat      Ambulation/Gait   Gait Comments  walked 20 feet in bars with turn. Walked forward 10 ft and backward 10 ft in bars; walked 36 ft with RW and CGA.       Knee/Hip Exercises: Seated   Long Arc Quad  3 sets;15 reps;1 set;Weights;Both    Long Arc Quad Weight  3 lbs.    Marching  AROM;Both;15 reps;Strengthening    Marching Weights  3 lbs.               PT Short Term Goals - 03/29/18 1546      PT SHORT TERM GOAL #1   Title  She will be independent in initial HEP    Status  On-going      PT SHORT TERM GOAL #2   Title  She will be able to transfer with board with min to no assistance    Baseline  Did without assistance  less excess time and effort,  with board    Status  Partially Met      PT SHORT TERM GOAL #3   Title  She will be able to pull herself to stand in parallel bars    Status  Achieved      PT SHORT TERM GOAL #4   Title  She will be able to take 10 step in bars with min to mod assist.     Status  Achieved      PT SHORT TERM GOAL #5   Title  She will be able to stand for 5 min  in bars    Status  On-going  PT Long Term Goals - 03/30/18 1429      PT LONG TERM GOAL #1   Title  She will be able to stand to walker with minor assist of one    Baseline  with 4 inches of cushion    Status  Achieved      PT LONG TERM GOAL #2   Title  She will be able to walk 50 feet with RW for home ambulation with +1 contact guard assist.     Baseline  15 feet    Status  On-going      PT LONG TERM GOAL #3   Title  She will be able to stand to walker with +1 min assist from wheel chair.     Status  Achieved      PT LONG TERM GOAL #4   Title  She will be able to get herself in and out of bed with +1 min assist with assist of bed     Status  On-going      PT LONG TERM GOAL #5   Title  she will report no increase in leg pain    Baseline  does not really change    Status  Achieved            Plan - 04/12/18 1526    Clinical Impression Statement  Treatment today focused on ambulation in the parallel bars and with RW and strengthening of the knees and hips. With retro walking in the parallel bars, pt assumed significant forward flexed posture, smaller steps, and required multiple VC to tuck bottom. Sit to stand from WC min A. Pt took multiple attempts to readjust self in [facility]. Pt walked 36 ft with RW and fatigued quickly.    PT Treatment/Interventions  Moist Heat;Therapeutic activities;Patient/family education;Functional mobility training;Therapeutic exercise;Gait training;Manual  techniques;Wheelchair mobility training;Passive range of motion;DME Instruction;ADLs/Self Care Home Management    PT Next Visit Plan  MAy go back to mat again with exercies after walking with RW as far as able    PT Home Exercise Plan  bridge , rolling , hip  abduction and SLR with assist    Consulted and Agree with Plan of Care  Patient    Family Member Consulted  daughter       Patient will benefit from skilled therapeutic intervention in order to improve the following deficits and impairments:  Pain, Postural dysfunction, Decreased activity tolerance, Decreased endurance, Decreased range of motion, Decreased strength, Decreased balance  Visit Diagnosis: Muscle weakness (generalized)  Stiffness of left knee, not elsewhere classified  Stiffness of right knee, not elsewhere classified  Chronic bilateral low back pain, with sciatica presence unspecified     Problem List There are no active problems to display for this patient.  Worthy Flank, SPT 04/12/18 3:59 PM   [place], [place], [address] Phone: [phone]   Fax:  [phone]  Name: Kathleen [name] MRN: [id number] Date of Birth: Mar 22, 1941

## 2018-04-15 ENCOUNTER — Ambulatory Visit: Payer: Medicare Other | Admitting: Physical Therapy

## 2018-04-15 ENCOUNTER — Encounter: Payer: Self-pay | Admitting: Physical Therapy

## 2018-04-15 DIAGNOSIS — M25661 Stiffness of right knee, not elsewhere classified: Secondary | ICD-10-CM

## 2018-04-15 DIAGNOSIS — M545 Low back pain: Secondary | ICD-10-CM

## 2018-04-15 DIAGNOSIS — G8929 Other chronic pain: Secondary | ICD-10-CM

## 2018-04-15 DIAGNOSIS — M6281 Muscle weakness (generalized): Secondary | ICD-10-CM

## 2018-04-15 DIAGNOSIS — M25662 Stiffness of left knee, not elsewhere classified: Secondary | ICD-10-CM

## 2018-04-15 NOTE — Therapy (Signed)
[place], [place], [address] Phone: [phone]   Fax:  [phone]  Physical Therapy Treatment  Patient Details  Name: Monet North MRN: [id number] Date of Birth: 02/01/1941 Referring Provider: Garwin Brothers, MD   Encounter Date: 04/15/2018  PT End of Session - 04/15/18 0932    Visit Number  14    Number of Visits  25    Date for PT Re-Evaluation  05/07/18    PT Start Time  0933    PT Stop Time  1013    PT Time Calculation (min)  40 min    Activity Tolerance  Patient tolerated treatment well    Behavior During Therapy  [facility] for tasks assessed/performed       History reviewed. No pertinent past medical history.  History reviewed. No pertinent surgical history.  There were no vitals filed for this visit.  Subjective Assessment - 04/15/18 1014    Subjective  "my right knee is alittle sore today and im not sure why"    Currently in Pain?  Yes    Pain Score  3     Pain Location  Knee    Pain Orientation  Right;Left    Pain Type  Chronic pain    Pain Onset  More than a month ago    Pain Frequency  Constant    Aggravating Factors   legs vary depending on activty    Pain Relieving Factors  resting/ sitting                       [facility] Adult PT Treatment/Exercise - 04/15/18 1016      Therapeutic Activites    Other Therapeutic Activities  standing pivot transfer to/from W/C to table with verbal cues for standing up straight and step progression.       Lumbar Exercises: Seated   Sit to Stand  10 reps from elevated table, verbal cues for hand placement using RW      Knee/Hip Exercises: Standing   Gait Training  pt ambulated 84f with RW with +2 CGA for safety utilizing frequent verbal cues to stand up straight. with W/C follow. Min +2 assist with sit to stand pt ambulated to table      Knee/Hip Exercises: Seated   Clamshell with TheraBand  Red around ankles working to fatigue                PT Short Term Goals - 03/29/18 1546      PT SHORT TERM GOAL #1   Title  She will be independent in initial HEP    Status  On-going      PT SHORT TERM GOAL #2   Title  She will be able to transfer with board with min to no assistance    Baseline  Did without assistance  less excess time and effort,  with board    Status  Partially Met      PT SHORT TERM GOAL #3   Title  She will be able to pull herself to stand in parallel bars    Status  Achieved      PT SHORT TERM GOAL #4   Title  She will be able to take 10 step in bars with min to mod assist.     Status  Achieved      PT SHORT TERM GOAL #5   Title  She will be able to stand for 5 min  in  bars    Status  On-going        PT Long Term Goals - 03/30/18 1429      PT LONG TERM GOAL #1   Title  She will be able to stand to walker with minor assist of one    Baseline  with 4 inches of cushion    Status  Achieved      PT LONG TERM GOAL #2   Title  She will be able to walk 50 feet with RW for home ambulation with +1 contact guard assist.     Baseline  15 feet    Status  On-going      PT LONG TERM GOAL #3   Title  She will be able to stand to walker with +1 min assist from wheel chair.     Status  Achieved      PT LONG TERM GOAL #4   Title  She will be able to get herself in and out of bed with +1 min assist with assist of bed     Status  On-going      PT LONG TERM GOAL #5   Title  she will report no increase in leg pain    Baseline  does not really change    Status  Achieved            Plan - 04/15/18 1020    Clinical Impression Statement  pt reports soreness in the R knee today which she is unsure why. pt ambulated 51 ft to table with +2 CGA for safety but required frequent cues to correct posture. pt reported feeling funny during todays session but reported no increase in pain during/ throughout session. she was able to perform sit to stand from elevated table required cues for proper hand  placement when using RW, and was able to perofrm standing pivot transfer to and from W/C.     PT Treatment/Interventions  Moist Heat;Therapeutic activities;Patient/family education;Functional mobility training;Therapeutic exercise;Gait training;Manual techniques;Wheelchair mobility training;Passive range of motion;DME Instruction;ADLs/Self Care Home Management    PT Next Visit Plan  Walking, hip / knee strength, standing posture, endurance training. Mat exercises    PT Home Exercise Plan  bridge , rolling , hip  abduction and SLR with assist    Consulted and Agree with Plan of Care  Patient       Patient will benefit from skilled therapeutic intervention in order to improve the following deficits and impairments:  Pain, Postural dysfunction, Decreased activity tolerance, Decreased endurance, Decreased range of motion, Decreased strength, Decreased balance  Visit Diagnosis: Muscle weakness (generalized)  Stiffness of left knee, not elsewhere classified  Stiffness of right knee, not elsewhere classified  Chronic bilateral low back pain, with sciatica presence unspecified     Problem List There are no active problems to display for this patient.  Starr Lake PT, DPT, LAT, ATC  04/15/18  10:25 AM      [facility] [address] [place], [place], [address] Phone: [phone]   Fax:  [phone]  Name: Leonette Tischer MRN: [id number] Date of Birth: June 08, 1941

## 2018-04-22 ENCOUNTER — Ambulatory Visit: Payer: Medicare Other

## 2018-04-22 DIAGNOSIS — M25662 Stiffness of left knee, not elsewhere classified: Secondary | ICD-10-CM

## 2018-04-22 DIAGNOSIS — M545 Low back pain: Secondary | ICD-10-CM

## 2018-04-22 DIAGNOSIS — M25511 Pain in right shoulder: Secondary | ICD-10-CM

## 2018-04-22 DIAGNOSIS — M6281 Muscle weakness (generalized): Secondary | ICD-10-CM

## 2018-04-22 DIAGNOSIS — M25661 Stiffness of right knee, not elsewhere classified: Secondary | ICD-10-CM

## 2018-04-22 DIAGNOSIS — G8929 Other chronic pain: Secondary | ICD-10-CM

## 2018-04-22 DIAGNOSIS — E669 Obesity, unspecified: Secondary | ICD-10-CM

## 2018-04-22 DIAGNOSIS — M25512 Pain in left shoulder: Secondary | ICD-10-CM

## 2018-04-22 NOTE — Therapy (Signed)
[place], [place], [address] Phone: [phone]   Fax:  [phone]  Physical Therapy Treatment  Patient Details  Name: Kathleen [name] MRN: [id number] Date of Birth: 1941/03/23 Referring Provider: Garwin Brothers, MD   Encounter Date: 04/22/2018  PT End of Session - 04/22/18 1353    Visit Number  15    Number of Visits  25    Date for PT Re-Evaluation  05/07/18    Authorization Type  [employer] [place] at visit 15  progress visit 10    PT Start Time  0110    PT Stop Time  0200    PT Time Calculation (min)  50 min    Equipment Utilized During Treatment  Gait belt    Activity Tolerance  Patient tolerated treatment well;No increased pain    Behavior During Therapy  [facility] for tasks assessed/performed       History reviewed. No pertinent past medical history.  History reviewed. No pertinent surgical history.  There were no vitals filed for this visit.  Subjective Assessment - 04/22/18 1317    Subjective  Feet hurt when I stand. I don't understand why I can't get up easier by now.                         [facility] Adult PT Treatment/Exercise - 04/22/18 0001      Ambulation/Gait   Gait Comments  walked in bars 25 feet then walked out of bars with walker 55 feet with close CG of1.      Then  walked    feet with RW  +1 CG       Lumbar Exercises: Seated   Long Arc Quad on Chair  Right;Left;Weights 3x10    LAQ on Chair Weights (lbs)  5    Other Seated Lumbar Exercises  marching with 5 pounds on legs  3x10      Knee/Hip Exercises: Seated   Other Seated Knee/Hip Exercises  side step RT and LT 10 feet in bars                PT Short Term Goals - 04/22/18 1407      PT SHORT TERM GOAL #1   Title  She will be independent in initial HEP    Baseline  she reports doing exer    Status  Achieved      PT SHORT TERM GOAL #2   Title  She will be able to transfer with board with min  to no assistance    Baseline  Did without assistance  less excess time and effort,  with board    Status  Partially Met      PT SHORT TERM GOAL #3   Title  She will be able to pull herself to stand in parallel bars    Status  Achieved      PT SHORT TERM GOAL #4   Title  She will be able to take 10 step in bars with min to mod assist.     Status  Achieved      PT SHORT TERM GOAL #5   Title  She will be able to stand for 5 min  in bars    Status  On-going      PT SHORT TERM GOAL #6   Title  She will have no increase in leg  pain    Status  Achieved        PT Long Term Goals - 04/22/18 1408      PT LONG TERM GOAL #1   Title  She will be able to stand to walker with minor assist of one    Status  Achieved      PT LONG TERM GOAL #2   Title  She will be able to walk 50 feet with RW for home ambulation with +1 contact guard assist.     Status  Achieved      PT LONG TERM GOAL #3   Title  She will be able to stand to walker with +1 min assist from wheel chair.     Status  Achieved      PT LONG TERM GOAL #4   Title  She will be able to get herself in and out of bed with +1 min assist with assist of bed     Status  On-going      PT LONG TERM GOAL #5   Title  she will report no increase in leg pain    Baseline  does not really change    Status  On-going            Plan - 04/22/18 1404    Clinical Impression Statement  She was able to walk 55 feet and a second walk of 45 feet today. She was able to push herself to stand from [facility] without added foam cushion.  Discussed need to stand and walk at home with assist to improve consistnacey of improvements and if she want to be able to walk at home she needs to practice the same on a daily basis.     PT Treatment/Interventions  Moist Heat;Therapeutic activities;Patient/family education;Functional mobility training;Therapeutic exercise;Gait training;Manual techniques;Wheelchair mobility training;Passive range of motion;DME  Instruction;ADLs/Self Care Home Management    PT Next Visit Plan  Walking, hip / knee strength, standing posture, endurance training. Mat exercises    PT Home Exercise Plan  bridge , rolling , hip  abduction and SLR with assist    Consulted and Agree with Plan of Care  Patient       Patient will benefit from skilled therapeutic intervention in order to improve the following deficits and impairments:  Pain, Postural dysfunction, Decreased activity tolerance, Decreased endurance, Decreased range of motion, Decreased strength, Decreased balance  Visit Diagnosis: Muscle weakness (generalized)  Stiffness of left knee, not elsewhere classified  Stiffness of right knee, not elsewhere classified  Chronic bilateral low back pain, with sciatica presence unspecified  Left shoulder pain, unspecified chronicity  Chronic right shoulder pain  Obesity, unspecified classification, unspecified obesity type, unspecified whether serious comorbidity present     Problem List There are no active problems to display for this patient.   [name]  PT 04/22/2018, 2:09 PM  [facility] [address] [place], [place], [address] Phone: [phone]   Fax:  [phone]  Name: Kathleen [name] MRN: [id number] Date of Birth: 1940-12-14

## 2018-04-22 NOTE — Addendum Note (Signed)
Addended by: Caprice RedHASSE, Ledell Codrington M on: 04/22/2018 02:19 PM   Modules accepted: Orders

## 2018-04-23 ENCOUNTER — Ambulatory Visit: Payer: Medicare Other | Admitting: Physical Therapy

## 2018-04-23 ENCOUNTER — Encounter: Payer: Medicare Other | Admitting: Physical Therapy

## 2018-04-23 ENCOUNTER — Encounter: Payer: Self-pay | Admitting: Physical Therapy

## 2018-04-23 DIAGNOSIS — M545 Low back pain: Secondary | ICD-10-CM

## 2018-04-23 DIAGNOSIS — G8929 Other chronic pain: Secondary | ICD-10-CM

## 2018-04-23 DIAGNOSIS — M6281 Muscle weakness (generalized): Secondary | ICD-10-CM | POA: Diagnosis not present

## 2018-04-23 DIAGNOSIS — M25661 Stiffness of right knee, not elsewhere classified: Secondary | ICD-10-CM

## 2018-04-23 DIAGNOSIS — M25662 Stiffness of left knee, not elsewhere classified: Secondary | ICD-10-CM

## 2018-04-23 NOTE — Therapy (Addendum)
[place], [place], [address] Phone: [phone]   Fax:  [phone]  Physical Therapy Treatment  Patient Details  Name: Kathleen [name] MRN: [id number] Date of Birth: 1941/02/25 Referring Provider: Garwin Brothers, MD   Encounter Date: 04/23/2018  PT End of Session - 04/23/18 1122    Visit Number  16    Number of Visits  25    Date for PT Re-Evaluation  05/28/18    Authorization Type  [employer] [place] at visit 15  progress visit 10    PT Start Time  1015    PT Stop Time  1110    PT Time Calculation (min)  55 min    Equipment Utilized During Treatment  Gait belt    Activity Tolerance  Patient tolerated treatment well    Behavior During Therapy  [facility] for tasks assessed/performed       History reviewed. No pertinent past medical history.  History reviewed. No pertinent surgical history.  There were no vitals filed for this visit.  Subjective Assessment - 04/23/18 1120    Subjective  "my feet are still hurting the most."    Currently in Pain?  Yes    Pain Score  4     Pain Location  Foot    Pain Orientation  Right;Left    Pain Descriptors / Indicators  Aching;Sore    Pain Type  Chronic pain    Pain Onset  More than a month ago    Pain Frequency  Constant    Aggravating Factors   activity    Pain Relieving Factors  resting                       [facility] Adult PT Treatment/Exercise - 04/23/18 0001      Transfers   Comments  sit to stand min A from WC; scoot to back of WC cues for use of UE and LE      Ambulation/Gait   Gait Comments  walked 36 ft and 82 ft with RW close CGA; cues to stand upright      Lumbar Exercises: Seated   Long Arc Quad on Chair  Right;Left;Weights;2 sets;10 reps    LAQ on Chair Weights (lbs)  -- 4    Other Seated Lumbar Exercises  seated marching with 4 pounds on legs 2x10    Other Seated Lumbar Exercises  attempted seated leg press down with  green theraband - pt demonstrates limited knee flexion      Lumbar Exercises: Supine   Ab Set  5 seconds 2 x 8 with physioball              PT Education - 04/23/18 1121    Education Details  HEP, use of bariatric walker, stand pivot transfer using RW to strengthen BLE    Person(s) Educated  Patient    Methods  Explanation    Comprehension  Verbalized understanding       PT Short Term Goals - 04/22/18 1407      PT SHORT TERM GOAL #1   Title  She will be independent in initial HEP    Baseline  she reports doing exer    Status  Achieved      PT SHORT TERM GOAL #2   Title  She will be able to transfer with board with min to no assistance  Baseline  Did without assistance  less excess time and effort,  with board    Status  Partially Met      PT SHORT TERM GOAL #3   Title  She will be able to pull herself to stand in parallel bars    Status  Achieved      PT SHORT TERM GOAL #4   Title  She will be able to take 10 step in bars with min to mod assist.     Status  Achieved      PT SHORT TERM GOAL #5   Title  She will be able to stand for 5 min  in bars    Status  On-going      PT SHORT TERM GOAL #6   Title  She will have no increase in leg pain    Status  Achieved        PT Long Term Goals - 04/22/18 1408      PT LONG TERM GOAL #1   Title  She will be able to stand to walker with minor assist of one    Status  Achieved      PT LONG TERM GOAL #2   Title  She will be able to walk 50 feet with RW for home ambulation with +1 contact guard assist.     Status  Achieved      PT LONG TERM GOAL #3   Title  She will be able to stand to walker with +1 min assist from wheel chair.     Status  Achieved      PT LONG TERM GOAL #4   Title  She will be able to get herself in and out of bed with +1 min assist with assist of bed     Status  On-going      PT LONG TERM GOAL #5   Title  she will report no increase in leg pain    Baseline  does not really change    Status   On-going            Plan - 04/23/18 1123    Clinical Impression Statement  Pt ambulated 36 ft and 82 ft for a total of 118 ft today. Treatment focused on strengthening of hip and knee musculature in sitting with added weight. Educated on importance of performing stand pivot transfer with RW to strengthen BLE. Also discussed benefits of using bariatric walker as it has increased support and is wider.     PT Treatment/Interventions  Moist Heat;Therapeutic activities;Patient/family education;Functional mobility training;Therapeutic exercise;Gait training;Manual techniques;Wheelchair mobility training;Passive range of motion;DME Instruction;ADLs/Self Care Home Management    PT Next Visit Plan  schedule until 8/30; Walking, hip / knee strength, standing posture, endurance training. Mat exercises, ankle strengthening    PT Home Exercise Plan  bridge , rolling , hip  abduction and SLR with assist    Consulted and Agree with Plan of Care  Patient    Family Member Consulted  daughter       Patient will benefit from skilled therapeutic intervention in order to improve the following deficits and impairments:  Pain, Postural dysfunction, Decreased activity tolerance, Decreased endurance, Decreased range of motion, Decreased strength, Decreased balance  Visit Diagnosis: Muscle weakness (generalized)  Stiffness of left knee, not elsewhere classified  Stiffness of right knee, not elsewhere classified  Chronic bilateral low back pain, with sciatica presence unspecified     Problem List There are no active problems to display  for this patient.  Worthy Flank, SPT 04/23/18 11:31 AM   [place] [facility] [address] [place], [place], [address] Phone: [phone]   Fax:  [phone]  Name: Kathleen [name] MRN: [id number] Date of Birth: [birth year]

## 2018-04-26 ENCOUNTER — Ambulatory Visit: Payer: Medicare Other | Admitting: Physical Therapy

## 2018-04-27 ENCOUNTER — Ambulatory Visit: Payer: Medicare Other | Admitting: Physical Therapy

## 2018-04-27 ENCOUNTER — Encounter: Payer: Self-pay | Admitting: Physical Therapy

## 2018-04-27 DIAGNOSIS — M545 Low back pain: Secondary | ICD-10-CM

## 2018-04-27 DIAGNOSIS — M6281 Muscle weakness (generalized): Secondary | ICD-10-CM | POA: Diagnosis not present

## 2018-04-27 DIAGNOSIS — M25661 Stiffness of right knee, not elsewhere classified: Secondary | ICD-10-CM

## 2018-04-27 DIAGNOSIS — M25662 Stiffness of left knee, not elsewhere classified: Secondary | ICD-10-CM

## 2018-04-27 DIAGNOSIS — G8929 Other chronic pain: Secondary | ICD-10-CM

## 2018-04-27 NOTE — Therapy (Addendum)
[place], [place], [address] Phone: [phone]   Fax:  [phone]  Physical Therapy Treatment  Progress Note Reporting Period 02/23/2018 to 04/23/2018  See note below for Objective Data and Assessment of Progress/Goals.      Patient Details  Name: Kathleen [name] MRN: [id number] Date of Birth: [birth year] Referring Provider: Garwin Brothers, MD   Encounter Date: 04/27/2018  PT End of Session - 04/27/18 0815    Visit Number  17    Number of Visits  25    Date for PT Re-Evaluation  05/28/18    Authorization Type  [employer] [place] at visit 15  progress visit 27    PT Start Time  0803    PT Stop Time  0848    PT Time Calculation (min)  45 min    Equipment Utilized During Treatment  Gait belt    Activity Tolerance  Patient tolerated treatment well    Behavior During Therapy  [facility] for tasks assessed/performed       History reviewed. No pertinent past medical history.  History reviewed. No pertinent surgical history.  There were no vitals filed for this visit.  Subjective Assessment - 04/27/18 0814    Subjective  "I feel fine. My feet still hurt."    Currently in Pain?  Yes    Pain Score  5     Pain Location  Foot    Pain Orientation  Right;Left    Pain Descriptors / Indicators  -- "nerves"    Pain Type  Chronic pain    Pain Radiating Towards  Radiates into BLE    Pain Onset  More than a month ago    Pain Frequency  Constant                       [facility] Adult PT Treatment/Exercise - 04/27/18 0001      Transfers   Comments  sit to stand min A from [facility]      Ambulation/Gait   Gait Comments  walked 50 ft and 62 ft with RW close CGA      Knee/Hip Exercises: Standing   Other Standing Knee Exercises  toe taps on 2 in step 2 x 5; cues to avoid circumduction of hips and to flex knee and hip      Knee/Hip Exercises: Seated   Other Seated Knee/Hip Exercises  pressing on  ball with foot (with glute squeeze) for glute activation bilaterally 2 x 15 bilaterally    Abduction/Adduction   AROM;Strengthening;Both;15 reps;3 sets abduction with green theraband at ankles with tactile cues               PT Short Term Goals - 04/22/18 1407      PT SHORT TERM GOAL #1   Title  She will be independent in initial HEP    Baseline  she reports doing exer    Status  Achieved      PT SHORT TERM GOAL #2   Title  She will be able to transfer with board with min to no assistance    Baseline  Did without assistance  less excess time and effort,  with board    Status  Partially Met      PT SHORT TERM GOAL #3   Title  She will be able to pull herself to stand in parallel bars  Status  Achieved      PT SHORT TERM GOAL #4   Title  She will be able to take 10 step in bars with min to mod assist.     Status  Achieved      PT SHORT TERM GOAL #5   Title  She will be able to stand for 5 min  in bars    Status  On-going      PT SHORT TERM GOAL #6   Title  She will have no increase in leg pain    Status  Achieved       PT Long Term Goals - 04/27/18 1034      PT LONG TERM GOAL #1   Title  She will be able to stand to walker with minor assist of one    Time  10    Period  Weeks    Status  Achieved      PT LONG TERM GOAL #2   Title  She will be able to walk 50 feet with RW for home ambulation with +1 contact guard assist.     Time  10    Period  Weeks    Status  Achieved      PT LONG TERM GOAL #3   Title  She will be able to stand to walker with +1 min assist from wheel chair.     Time  10    Period  Weeks    Status  Achieved      PT LONG TERM GOAL #4   Title  She will be able to get herself in and out of bed with +1 min assist with assist of bed     Time  10    Period  Weeks    Status  On-going      PT LONG TERM GOAL #5   Title  she will report no increase in leg pain    Time  10    Period  Weeks    Status  On-going             Plan -  04/27/18 0825    Clinical Impression Statement  Pt ambulated 50 ft and 62 ft for a total of 112 ft today. Treatment focused on strengthening of hip musculature and increasing hip/knee flexion to assist in gait. Large emphasis on decreased hip circumduction during ambulation and increasing hip/knee flexion.     PT Treatment/Interventions  Moist Heat;Therapeutic activities;Patient/family education;Functional mobility training;Therapeutic exercise;Gait training;Manual techniques;Wheelchair mobility training;Passive range of motion;DME Instruction;ADLs/Self Care Home Management    PT Next Visit Plan  schedule until 8/30; Walking, hip / knee strength, standing posture, endurance training. Mat exercises, ankle strengthening    PT Home Exercise Plan  bridge , rolling , hip  abduction and SLR with assist    Consulted and Agree with Plan of Care  Patient       Patient will benefit from skilled therapeutic intervention in order to improve the following deficits and impairments:  Pain, Postural dysfunction, Decreased activity tolerance, Decreased endurance, Decreased range of motion, Decreased strength, Decreased balance  Visit Diagnosis: Muscle weakness (generalized)  Stiffness of left knee, not elsewhere classified  Stiffness of right knee, not elsewhere classified  Chronic bilateral low back pain, with sciatica presence unspecified     Problem List There are no active problems to display for this patient.  Worthy Flank, SPT 04/27/18 9:48 AM   [place]  [place], [place], [address] Phone: [phone]   Fax:  [phone]  Name: Kathleen [name] MRN: [id number] Date of Birth: 11-09-1940

## 2018-04-28 ENCOUNTER — Ambulatory Visit: Payer: Medicare Other | Admitting: Physical Therapy

## 2018-04-28 ENCOUNTER — Encounter: Payer: Self-pay | Admitting: Physical Therapy

## 2018-04-28 DIAGNOSIS — M545 Low back pain: Secondary | ICD-10-CM

## 2018-04-28 DIAGNOSIS — G8929 Other chronic pain: Secondary | ICD-10-CM

## 2018-04-28 DIAGNOSIS — M6281 Muscle weakness (generalized): Secondary | ICD-10-CM

## 2018-04-28 DIAGNOSIS — M25662 Stiffness of left knee, not elsewhere classified: Secondary | ICD-10-CM

## 2018-04-28 DIAGNOSIS — M25661 Stiffness of right knee, not elsewhere classified: Secondary | ICD-10-CM

## 2018-04-28 NOTE — Therapy (Signed)
[place], [place], [address] Phone: [phone]   Fax:  [phone]  Physical Therapy Treatment  Patient Details  Name: Kathleen [name] MRN: [id number] Date of Birth: 1941-09-29 Referring Provider: Garwin Brothers, MD   Encounter Date: 04/28/2018  PT End of Session - 04/28/18 1703    Visit Number  18    Number of Visits  25    Date for PT Re-Evaluation  05/28/18    Authorization Type  [employer] [place] at visit 15  progress visit 27    PT Start Time  1500    PT Stop Time  1544    PT Time Calculation (min)  44 min    Equipment Utilized During Treatment  Gait belt    Activity Tolerance  Patient tolerated treatment well    Behavior During Therapy  [facility] for tasks assessed/performed       History reviewed. No pertinent past medical history.  History reviewed. No pertinent surgical history.  There were no vitals filed for this visit.  Subjective Assessment - 04/28/18 1546    Subjective  "I feel okay."    Pain Score  4     Pain Location  Foot    Pain Orientation  Left;Right    Pain Descriptors / Indicators  -- nerves    Pain Type  Chronic pain    Pain Onset  More than a month ago                       [facility] Adult PT Treatment/Exercise - 04/28/18 0001      Transfers   Comments  sit to stand min A from [facility]      Ambulation/Gait   Gait Comments  walked 50 ft with RW and close CGA      Lumbar Exercises: Seated   Long Arc Quad on Chair  Right;Left;Weights;2 sets;10 reps    LAQ on Chair Weights (lbs)  4    Other Seated Lumbar Exercises  seated marching with 4 pounds on legs 2x10      Knee/Hip Exercises: Seated   Other Seated Knee/Hip Exercises  pressing on ball with foot (with glute squeeze) for glute activation bilaterally 2 x 10 bilaterally    Other Seated Knee/Hip Exercises  chair scoots AAROM 92 ft    Abduction/Adduction   AROM;Strengthening;Both;15 reps;3 sets abduction  with red theraband at ankles       Ankle Exercises: Seated   Other Seated Ankle Exercises  plantar flexion red theraband 2 x 15 bilaterally             PT Education - 04/28/18 1702    Education provided  Yes    Education Details  use of cushion in seat    Person(s) Educated  Patient    Methods  Explanation    Comprehension  Verbalized understanding       PT Short Term Goals - 04/22/18 1407      PT SHORT TERM GOAL #1   Title  She will be independent in initial HEP    Baseline  she reports doing exer    Status  Achieved      PT SHORT TERM GOAL #2   Title  She will be able to transfer with board with min to no assistance    Baseline  Did without assistance  less excess time and effort,  with  board    Status  Partially Met      PT SHORT TERM GOAL #3   Title  She will be able to pull herself to stand in parallel bars    Status  Achieved      PT SHORT TERM GOAL #4   Title  She will be able to take 10 step in bars with min to mod assist.     Status  Achieved      PT SHORT TERM GOAL #5   Title  She will be able to stand for 5 min  in bars    Status  On-going      PT SHORT TERM GOAL #6   Title  She will have no increase in leg pain    Status  Achieved        PT Long Term Goals - 04/27/18 1034      PT LONG TERM GOAL #1   Title  She will be able to stand to walker with minor assist of one    Time  10    Period  Weeks    Status  Achieved      PT LONG TERM GOAL #2   Title  She will be able to walk 50 feet with RW for home ambulation with +1 contact guard assist.     Time  10    Period  Weeks    Status  Achieved      PT LONG TERM GOAL #3   Title  She will be able to stand to walker with +1 min assist from wheel chair.     Time  10    Period  Weeks    Status  Achieved      PT LONG TERM GOAL #4   Title  She will be able to get herself in and out of bed with +1 min assist with assist of bed     Time  10    Period  Weeks    Status  On-going      PT LONG  TERM GOAL #5   Title  she will report no increase in leg pain    Time  10    Period  Weeks    Status  On-going            Plan - 04/28/18 1704    Clinical Impression Statement  Pt ambulated 50 ft today. Treatment focused on strengthening of hip and knee musculature in sitting. Also introduced chair scoots today to increase knee flexion and strength. Pt will need further instruction with this exercise as she needed max assistance to complete.     PT Treatment/Interventions  Moist Heat;Therapeutic activities;Patient/family education;Functional mobility training;Therapeutic exercise;Gait training;Manual techniques;Wheelchair mobility training;Passive range of motion;DME Instruction;ADLs/Self Care Home Management    PT Next Visit Plan  schedule until 8/30; Walking, hip / knee strength, standing posture, endurance training. Mat exercises, ankle strengthening    PT Home Exercise Plan  bridge , rolling , hip  abduction and SLR with assist    Consulted and Agree with Plan of Care  Patient       Patient will benefit from skilled therapeutic intervention in order to improve the following deficits and impairments:  Pain, Postural dysfunction, Decreased activity tolerance, Decreased endurance, Decreased range of motion, Decreased strength, Decreased balance  Visit Diagnosis: Muscle weakness (generalized)  Stiffness of left knee, not elsewhere classified  Chronic bilateral low back pain, with sciatica presence unspecified  Stiffness of right knee,  not elsewhere classified     Problem List There are no active problems to display for this patient.  Worthy Flank, SPT 04/28/18 5:09 PM   [facility] [address] [place], [place], [address] Phone: [phone]   Fax:  [phone]  Name: Alea Ryer MRN: [id number] Date of Birth: November 13, 1940

## 2018-05-05 ENCOUNTER — Ambulatory Visit: Payer: Medicare Other | Attending: Internal Medicine

## 2018-05-05 DIAGNOSIS — M25662 Stiffness of left knee, not elsewhere classified: Secondary | ICD-10-CM | POA: Insufficient documentation

## 2018-05-05 DIAGNOSIS — G8929 Other chronic pain: Secondary | ICD-10-CM | POA: Insufficient documentation

## 2018-05-05 DIAGNOSIS — M25512 Pain in left shoulder: Secondary | ICD-10-CM | POA: Insufficient documentation

## 2018-05-05 DIAGNOSIS — M545 Low back pain: Secondary | ICD-10-CM | POA: Insufficient documentation

## 2018-05-05 DIAGNOSIS — M6281 Muscle weakness (generalized): Secondary | ICD-10-CM | POA: Insufficient documentation

## 2018-05-05 DIAGNOSIS — M25661 Stiffness of right knee, not elsewhere classified: Secondary | ICD-10-CM | POA: Insufficient documentation

## 2018-05-05 DIAGNOSIS — M25511 Pain in right shoulder: Secondary | ICD-10-CM | POA: Insufficient documentation

## 2018-05-05 NOTE — Therapy (Signed)
[place], [place], [address] Phone: [phone]   Fax:  [phone]  Physical Therapy Treatment  Patient Details  Name: Kathleen [name] MRN: [id number] Date of Birth: [birth year] Referring Provider: Garwin Brothers, MD   Encounter Date: 05/05/2018  PT End of Session - 05/05/18 1310    Visit Number  19    Number of Visits  25    Date for PT Re-Evaluation  05/28/18    Authorization Type  [employer] [place] at visit 15  progress visit 27    PT Start Time  0110    PT Stop Time  0200    PT Time Calculation (min)  50 min    Equipment Utilized During Treatment  Gait belt    Activity Tolerance  Patient tolerated treatment well    Behavior During Therapy  [facility] for tasks assessed/performed       History reviewed. No pertinent past medical history.  History reviewed. No pertinent surgical history.  There were no vitals filed for this visit.  Subjective Assessment - 05/05/18 1315    Subjective  Able to stand with walker and get out of bed to WC.  No more use of slididng board.  Except to get into car.   Last time walked with walker at home  was last week.  Was at home over last weekend with children. Did not use the bathroom at home    Pain Score  4     Pain Location  Foot    Pain Orientation  Left;Right    Pain Descriptors / Indicators  -- nerves    Pain Type  Chronic pain    Pain Onset  More than a month ago    Pain Frequency  Constant    Aggravating Factors   weight bearing    Pain Relieving Factors  rest    Multiple Pain Sites  -- varies in back                       [facility] Adult PT Treatment/Exercise - 05/05/18 0001      Ambulation/Gait   Gait Comments  walk 20 feet in bars then work on standing without support 5x5 sec then 3 x 15-20 sec.  then walked with RW 99.5 feet and then 55 feet both CG      Lumbar Exercises: Standing   Other Standing Lumbar Exercises  attempted alt hip  flexionx10 then abduction x 5 Rt and she stopped due to fatigue.                PT Short Term Goals - 04/22/18 1407      PT SHORT TERM GOAL #1   Title  She will be independent in initial HEP    Baseline  she reports doing exer    Status  Achieved      PT SHORT TERM GOAL #2   Title  She will be able to transfer with board with min to no assistance    Baseline  Did without assistance  less excess time and effort,  with board    Status  Partially Met      PT SHORT TERM GOAL #3   Title  She will be able to pull herself to stand in parallel bars    Status  Achieved      PT SHORT TERM GOAL #4  Title  She will be able to take 10 step in bars with min to mod assist.     Status  Achieved      PT SHORT TERM GOAL #5   Title  She will be able to stand for 5 min  in bars    Status  On-going      PT SHORT TERM GOAL #6   Title  She will have no increase in leg pain    Status  Achieved        PT Long Term Goals - 04/27/18 1034      PT LONG TERM GOAL #1   Title  She will be able to stand to walker with minor assist of one    Time  10    Period  Weeks    Status  Achieved      PT LONG TERM GOAL #2   Title  She will be able to walk 50 feet with RW for home ambulation with +1 contact guard assist.     Time  10    Period  Weeks    Status  Achieved      PT LONG TERM GOAL #3   Title  She will be able to stand to walker with +1 min assist from wheel chair.     Time  10    Period  Weeks    Status  Achieved      PT LONG TERM GOAL #4   Title  She will be able to get herself in and out of bed with +1 min assist with assist of bed     Time  10    Period  Weeks    Status  On-going      PT LONG TERM GOAL #5   Title  she will report no increase in leg pain    Time  10    Period  Weeks    Status  On-going            Plan - 05/05/18 1311    Clinical Impression Statement  Abulated farther than any time before and approaching community distances but more now  house hold  distances. For safety she needs CG and continue work with walking at home and to bathroom.      PT Treatment/Interventions  Moist Heat;Therapeutic activities;Patient/family education;Functional mobility training;Therapeutic exercise;Gait training;Manual techniques;Wheelchair mobility training;Passive range of motion;DME Instruction;ADLs/Self Care Home Management    PT Next Visit Plan   Walking, hip / knee strength, standing posture, endurance training. Mat exercises, ankle strengthening    PT Home Exercise Plan  bridge , rolling , hip  abduction and SLR with assist    Consulted and Agree with Plan of Care  Patient       Patient will benefit from skilled therapeutic intervention in order to improve the following deficits and impairments:  Pain, Postural dysfunction, Decreased activity tolerance, Decreased endurance, Decreased range of motion, Decreased strength, Decreased balance  Visit Diagnosis: Muscle weakness (generalized)  Stiffness of left knee, not elsewhere classified  Chronic bilateral low back pain, with sciatica presence unspecified  Stiffness of right knee, not elsewhere classified     Problem List There are no active problems to display for this patient.   [name]  PT 05/05/2018, 2:14 PM  [facility] [address] [place], [place], [address] Phone: [phone]   Fax:  [phone]  Name: Ritu Gagliardo MRN: [id number] Date of Birth: June 16, 1941

## 2018-05-07 ENCOUNTER — Ambulatory Visit
Admission: RE | Admit: 2018-05-07 | Discharge: 2018-05-07 | Disposition: A | Discharge status: Home / Self Care | Payer: Medicare Other | Source: Ambulatory Visit | Attending: Geriatric Medicine | Admitting: Geriatric Medicine

## 2018-05-07 DIAGNOSIS — Z1231 Encounter for screening mammogram for malignant neoplasm of breast: Secondary | ICD-10-CM

## 2018-05-11 ENCOUNTER — Ambulatory Visit: Payer: Medicare Other

## 2018-05-11 DIAGNOSIS — M25661 Stiffness of right knee, not elsewhere classified: Secondary | ICD-10-CM

## 2018-05-11 DIAGNOSIS — M6281 Muscle weakness (generalized): Secondary | ICD-10-CM | POA: Diagnosis not present

## 2018-05-11 DIAGNOSIS — M545 Low back pain: Secondary | ICD-10-CM

## 2018-05-11 DIAGNOSIS — M25662 Stiffness of left knee, not elsewhere classified: Secondary | ICD-10-CM

## 2018-05-11 DIAGNOSIS — M25511 Pain in right shoulder: Secondary | ICD-10-CM

## 2018-05-11 DIAGNOSIS — G8929 Other chronic pain: Secondary | ICD-10-CM

## 2018-05-11 DIAGNOSIS — M25512 Pain in left shoulder: Secondary | ICD-10-CM

## 2018-05-11 NOTE — Addendum Note (Signed)
Addended by: Caprice RedHASSE, Ladarion Munyon M on: 05/11/2018 03:33 PM   Modules accepted: Orders

## 2018-05-11 NOTE — Therapy (Addendum)
[place], [place], [address] Phone: [phone]   Fax:  [phone]  Physical Therapy Treatment  Patient Details  Name: Kathleen [name] MRN: [id number] Date of Birth: 11-Sep-1941 Referring Provider: Garwin Brothers, MD  Progress Note Reporting Period 04/06/18 to 05/11/18  See note below for Objective Data and Assessment of Progress/Goals.      Encounter Date: 05/11/2018  PT End of Session - 05/11/18 1411    Visit Number  20    Number of Visits  33    Date for PT Re-Evaluation  06/25/18    Authorization Type  [employer] [place] at visit 15  progress visit 27    PT Start Time  0210    PT Stop Time  0300    PT Time Calculation (min)  50 min    Equipment Utilized During Treatment  Gait belt    Activity Tolerance  Patient tolerated treatment well    Behavior During Therapy  [facility] for tasks assessed/performed       History reviewed. No pertinent past medical history.  History reviewed. No pertinent surgical history.  There were no vitals filed for this visit.  Subjective Assessment - 05/11/18 1439    Subjective  Pt presented paper with questions about functional activity for home.     Pain Score  4     Pain Location  Foot    Pain Orientation  Left;Right    Pain Descriptors / Indicators  --   nerves   Pain Type  Chronic pain    Pain Onset  More than a month ago    Pain Frequency  Constant    Aggravating Factors   weight bearing    Pain Relieving Factors  rest                       [facility] Adult PT Treatment/Exercise - 05/11/18 0001      Transfers   Comments  She was able to stand and transfer with RW to mat and with mat raised stand and walk to WC.         Ambulation/Gait   Gait Comments  walked 10 feet in bars then                50 feet with functional activity      Therapeutic Activites    Other Therapeutic Activities  Worked on entering room with walker sideway and  she was able to do this   . Did this x 2 .   with CGA. Then worked on  getting into /out of bed   She was able to go from supine to sit with verbal cues and min assist thuough needed asssit  to scoot Lt to get space to roll.       Lumbar Exercises: Supine   Straight Leg Raises Limitations  assisted x 12 with verbal encouragement to control lowering and max lifting effort RT and Lt              PT Education - 05/11/18 1440    Education Details  discussed questions posed by daughter and wrote on paperoptions for addressing these. Discussed fall risk with doing acitity on feet with pt     Person(s) Educated  Patient    Methods  Explanation    Comprehension  Verbalized understanding       PT Short Term  Goals - 05/11/18 1526      PT SHORT TERM GOAL #1   Title  She will be independent in initial HEP    Status  Achieved      PT SHORT TERM GOAL #2   Title  She will be able to transfer with board with min to no assistance    Baseline  now using walker and walking to sit in and out of bed    Status  Achieved      PT SHORT TERM GOAL #3   Title  She will be able to pull herself to stand in parallel bars    Status  Achieved      PT SHORT TERM GOAL #4   Title  She will be able to take 10 step in bars with min to mod assist.     Status  Achieved      PT SHORT TERM GOAL #5   Title  She will be able to stand for 5 min  in bars    Status  Deferred      PT SHORT TERM GOAL #6   Title  She will have no increase in leg pain    Baseline  only reported knee pain no incr in leg pain    Status  Achieved        PT Long Term Goals - 05/11/18 1527      PT LONG TERM GOAL #1   Title  She will be able to stand to walker with minor assist of one    Status  Achieved      PT LONG TERM GOAL #2   Title  She will be able to walk 50 feet with RW for home ambulation with +1 contact guard assist.     Status  Achieved      PT LONG TERM GOAL #3   Title  She will be able to stand to walker with +1  min assist from wheel chair.     Status  Achieved      PT LONG TERM GOAL #4   Title  She will be able to get herself in and out of bed with +1 min assist with assist of bed     Baseline  Need mod assist for legs to get in bed and min assist but verbal cues to get out    Status  Partially Met      PT LONG TERM GOAL #5   Title  she will report no increase in leg pain    Baseline  does not really change    Status  Achieved      Additional Long Term Goals   Additional Long Term Goals  Yes      PT LONG TERM GOAL #6   Title  With supervision and walker will be able to walk up incline 20 feet     Time  6    Period  Weeks    Status  New      PT LONG TERM GOAL #7   Title  She will be able to get herself in bed with no assist    Time  6    Period  Weeks    Status  New      PT LONG TERM GOAL #8   Title  She will be able to place walker on sidewalk and step up on curb safely to access community    Time  6    Period  Weeks  Status  New            Plan - 05/11/18 1412    Clinical Impression Statement  Addressed getting into bed and out. and walking through doorway  with walker sideway.   She is doing well/better and continues to make progress so extension of PT is warrented through September, but fall risk still remains high due to muliple areas of dysfunction.     Clinical Impairments Affecting Rehab Potential  obesity , previous back surgery, significant LE weakness and stiffness of joints.  significnat core weakness    PT Treatment/Interventions  Moist Heat;Therapeutic activities;Patient/family education;Functional mobility training;Therapeutic exercise;Gait training;Manual techniques;Wheelchair mobility training;Passive range of motion;DME Instruction;ADLs/Self Care Home Management    PT Next Visit Plan   Walking, hip / knee strength, standing posture, endurance training. Mat exercises, ankle strengthening    PT Home Exercise Plan  bridge , rolling , hip  abduction and SLR with  assist    Consulted and Agree with Plan of Care  Patient       Patient will benefit from skilled therapeutic intervention in order to improve the following deficits and impairments:  Pain, Postural dysfunction, Decreased activity tolerance, Decreased endurance, Decreased range of motion, Decreased strength, Decreased balance  Visit Diagnosis: Muscle weakness (generalized)  Stiffness of left knee, not elsewhere classified  Chronic bilateral low back pain, with sciatica presence unspecified  Stiffness of right knee, not elsewhere classified  Left shoulder pain, unspecified chronicity  Chronic right shoulder pain     Problem List There are no active problems to display for this patient.   [name]  PT 05/11/2018, 3:31 PM   [facility] [address] [place], [place], [address] Phone: [phone]   Fax:  [phone]  Name: Kathleen [name] MRN: [id number] Date of Birth: [birth year]

## 2018-05-13 ENCOUNTER — Ambulatory Visit: Payer: Medicare Other

## 2018-05-13 DIAGNOSIS — G8929 Other chronic pain: Secondary | ICD-10-CM

## 2018-05-13 DIAGNOSIS — M6281 Muscle weakness (generalized): Secondary | ICD-10-CM

## 2018-05-13 DIAGNOSIS — M25661 Stiffness of right knee, not elsewhere classified: Secondary | ICD-10-CM

## 2018-05-13 DIAGNOSIS — M545 Low back pain: Secondary | ICD-10-CM

## 2018-05-13 DIAGNOSIS — M25662 Stiffness of left knee, not elsewhere classified: Secondary | ICD-10-CM

## 2018-05-13 NOTE — Therapy (Signed)
[place], [place], [address] Phone: [phone]   Fax:  [phone]  Physical Therapy Treatment  Patient Details  Name: Kathleen [name] MRN: [id number] Date of Birth: Nov 29, 1940 Referring Provider: Garwin Brothers, MD   Encounter Date: 05/13/2018  PT End of Session - 05/13/18 1319    Visit Number  21    Number of Visits  33    Date for PT Re-Evaluation  06/25/18    Authorization Type  [employer] [place] at visit 15  progress visit 27    PT Start Time  0115    PT Stop Time  0215    PT Time Calculation (min)  60 min    Activity Tolerance  Patient tolerated treatment well    Behavior During Therapy  [facility] for tasks assessed/performed       History reviewed. No pertinent past medical history.  History reviewed. No pertinent surgical history.  There were no vitals filed for this visit.  Subjective Assessment - 05/13/18 1416    Subjective  No new complaints. REported pain RT hip with bed mobility.      Pain Score  4     Pain Location  Foot    Pain Orientation  Left;Right    Pain Descriptors / Indicators  Tingling   nerves   Pain Type  Chronic pain    Pain Onset  More than a month ago    Pain Frequency  Constant                       [facility] Adult PT Treatment/Exercise - 05/13/18 0001      Ambulation/Gait   Gait Comments  60 feet then went to mat for mat exer and mobility      Therapeutic Activites    Other Therapeutic Activities  Daughter here and wew reviewed transfers and mobility to include. Worked on entering room with walker sideway and she was able to do this   . Did this x 2 .   with CGA. Then worked on  getting into /out of bed   She was able to go from supine to sit with verbal cues and min assist thuough needed asssit  to scoot Lt to get space to roll.       Lumbar Exercises: Supine   Straight Leg Raises Limitations  assisted x  15 verbal encouragement to control  lowering and max lifting effort RT and Lt  Daughter did 3-5 reps also                PT Short Term Goals - 05/11/18 1526      PT SHORT TERM GOAL #1   Title  She will be independent in initial HEP    Status  Achieved      PT SHORT TERM GOAL #2   Title  She will be able to transfer with board with min to no assistance    Baseline  now using walker and walking to sit in and out of bed    Status  Achieved      PT SHORT TERM GOAL #3   Title  She will be able to pull herself to stand in parallel bars    Status  Achieved      PT SHORT TERM GOAL #4   Title  She will be able to take 10 step in bars with  min to mod assist.     Status  Achieved      PT SHORT TERM GOAL #5   Title  She will be able to stand for 5 min  in bars    Status  Deferred      PT SHORT TERM GOAL #6   Title  She will have no increase in leg pain    Baseline  only reported knee pain no incr in leg pain    Status  Achieved        PT Long Term Goals - 05/11/18 1527      PT LONG TERM GOAL #1   Title  She will be able to stand to walker with minor assist of one    Status  Achieved      PT LONG TERM GOAL #2   Title  She will be able to walk 50 feet with RW for home ambulation with +1 contact guard assist.     Status  Achieved      PT LONG TERM GOAL #3   Title  She will be able to stand to walker with +1 min assist from wheel chair.     Status  Achieved      PT LONG TERM GOAL #4   Title  She will be able to get herself in and out of bed with +1 min assist with assist of bed     Baseline  Need mod assist for legs to get in bed and min assist but verbal cues to get out    Status  Partially Met      PT LONG TERM GOAL #5   Title  she will report no increase in leg pain    Baseline  does not really change    Status  Achieved      Additional Long Term Goals   Additional Long Term Goals  Yes      PT LONG TERM GOAL #6   Title  With supervision and walker will be able to walk up incline 20 feet      Time  6    Period  Weeks    Status  New      PT LONG TERM GOAL #7   Title  She will be able to get herself in bed with no assist    Time  6    Period  Weeks    Status  New      PT LONG TERM GOAL #8   Title  She will be able to place walker on sidewalk and step up on curb safely to access community    Time  [place] - 05/13/18 1319    Clinical Impression Statement  She did well for daughter and may have required less assist with bed mobility but still painful and significant effort even with help. Will look at walking up ramp and step up on 4-6 inchstep    PT Treatment/Interventions  Moist Heat;Therapeutic activities;Patient/family education;Functional mobility training;Therapeutic exercise;Gait training;Manual techniques;Wheelchair mobility training;Passive range of motion;DME Instruction;ADLs/Self Care Home Management    PT Next Visit Plan   Walking, hip / knee strength, standing posture, endurance training. Mat exercises, ankle strengthening, gait ramp and  plateform /step    PT Home Exercise Plan  bridge , rolling , hip  abduction and SLR with assist    Consulted and  Agree with Plan of Care  Patient    Family Member Consulted  daughter       Patient will benefit from skilled therapeutic intervention in order to improve the following deficits and impairments:  Pain, Postural dysfunction, Decreased activity tolerance, Decreased endurance, Decreased range of motion, Decreased strength, Decreased balance  Visit Diagnosis: Muscle weakness (generalized)  Stiffness of left knee, not elsewhere classified  Chronic bilateral low back pain, with sciatica presence unspecified  Stiffness of right knee, not elsewhere classified     Problem List There are no active problems to display for this patient.   [name]  PT 05/13/2018, 2:18 PM  [facility] [address] [place], [place], [address] Phone: [phone]   Fax:  [phone]  Name: Yan Okray MRN: [id number] Date of Birth: 1940/10/21

## 2018-05-17 ENCOUNTER — Ambulatory Visit: Payer: Medicare Other | Admitting: Physical Therapy

## 2018-05-17 ENCOUNTER — Encounter: Payer: Self-pay | Admitting: Physical Therapy

## 2018-05-17 DIAGNOSIS — G8929 Other chronic pain: Secondary | ICD-10-CM

## 2018-05-17 DIAGNOSIS — M25661 Stiffness of right knee, not elsewhere classified: Secondary | ICD-10-CM

## 2018-05-17 DIAGNOSIS — M25662 Stiffness of left knee, not elsewhere classified: Secondary | ICD-10-CM

## 2018-05-17 DIAGNOSIS — M545 Low back pain: Secondary | ICD-10-CM

## 2018-05-17 DIAGNOSIS — M6281 Muscle weakness (generalized): Secondary | ICD-10-CM

## 2018-05-17 NOTE — Therapy (Signed)
[place], [place], [address] Phone: [phone]   Fax:  [phone]  Physical Therapy Treatment  Patient Details  Name: Kathleen [name] MRN: [id number] Date of Birth: [birth year] Referring Provider: Garwin Brothers, MD   Encounter Date: 05/17/2018  PT End of Session - 05/17/18 1335    Visit Number  22    Number of Visits  33    Date for PT Re-Evaluation  06/25/18    Authorization Type  [employer] [place] at visit 15  progress visit 27    PT Start Time  1335    PT Stop Time  1414    PT Time Calculation (min)  39 min    Activity Tolerance  Patient tolerated treatment well    Behavior During Therapy  [facility] for tasks assessed/performed       History reviewed. No pertinent past medical history.  History reviewed. No pertinent surgical history.  There were no vitals filed for this visit.  Subjective Assessment - 05/17/18 1337    Subjective  "everything else is doing good, my feet and toes are bothering like it usually does"     Currently in Pain?  Yes    Pain Score  4     Pain Location  Foot    Pain Orientation  Right;Left    Pain Type  Chronic pain    Pain Onset  More than a month ago    Pain Frequency  Constant                       [facility] Adult PT Treatment/Exercise - 05/17/18 0001      Knee/Hip Exercises: Standing   Forward Step Up  2 sets;Step Height: 2"    Forward Step Up Limitations  attempted 6 inch step but pt was unable to get her foot on the step    Gait Training  pt ambulated 103 ft with RW, +1 supervision for safety with chair follow    Other Standing Knee Exercises  toe taps on 4 inch step 2 x 10 ea.    Other Standing Knee Exercises  lateral steppin gin // 4 x      Knee/Hip Exercises: Seated   Other Seated Knee/Hip Exercises  hamstring curl 2 x 15 bil with red theradband      Ankle Exercises: Seated   Other Seated Ankle Exercises  plantar flexion red theraband  2 x 15 bilaterally, DF 2 x 15 bil               PT Short Term Goals - 05/11/18 1526      PT SHORT TERM GOAL #1   Title  She will be independent in initial HEP    Status  Achieved      PT SHORT TERM GOAL #2   Title  She will be able to transfer with board with min to no assistance    Baseline  now using walker and walking to sit in and out of bed    Status  Achieved      PT SHORT TERM GOAL #3   Title  She will be able to pull herself to stand in parallel bars    Status  Achieved      PT SHORT TERM GOAL #4   Title  She will be able to take 10 step in bars with min to mod assist.  Status  Achieved      PT SHORT TERM GOAL #5   Title  She will be able to stand for 5 min  in bars    Status  Deferred      PT SHORT TERM GOAL #6   Title  She will have no increase in leg pain    Baseline  only reported knee pain no incr in leg pain    Status  Achieved        PT Long Term Goals - 05/11/18 1527      PT LONG TERM GOAL #1   Title  She will be able to stand to walker with minor assist of one    Status  Achieved      PT LONG TERM GOAL #2   Title  She will be able to walk 50 feet with RW for home ambulation with +1 contact guard assist.     Status  Achieved      PT LONG TERM GOAL #3   Title  She will be able to stand to walker with +1 min assist from wheel chair.     Status  Achieved      PT LONG TERM GOAL #4   Title  She will be able to get herself in and out of bed with +1 min assist with assist of bed     Baseline  Need mod assist for legs to get in bed and min assist but verbal cues to get out    Status  Partially Met      PT LONG TERM GOAL #5   Title  she will report no increase in leg pain    Baseline  does not really change    Status  Achieved      Additional Long Term Goals   Additional Long Term Goals  Yes      PT LONG TERM GOAL #6   Title  With supervision and walker will be able to walk up incline 20 feet     Time  6    Period  Weeks    Status   New      PT LONG TERM GOAL #7   Title  She will be able to get herself in bed with no assist    Time  6    Period  Weeks    Status  New      PT LONG TERM GOAL #8   Title  She will be able to place walker on sidewalk and step up on curb safely to access community    Time  6    Period  Weeks    Status  New            Plan - 05/17/18 1416    Clinical Impression Statement  continued working on gait training with gait training walking laterally and practicing step up which she was unable to perform with 6 inch step. continued ankle strengthening and hamstring activiation. She was able to ambulate 115f today required verbal cues for motivation.     PT Next Visit Plan   Walking, hip / knee strength, standing posture, endurance training. Mat exercises, ankle strengthening, gait ramp and  plateform /step    PT Home Exercise Plan  bridge , rolling , hip  abduction and SLR with assist    Consulted and Agree with Plan of Care  Patient       Patient will benefit from skilled therapeutic intervention in order to improve  the following deficits and impairments:  Pain, Postural dysfunction, Decreased activity tolerance, Decreased endurance, Decreased range of motion, Decreased strength, Decreased balance  Visit Diagnosis: Muscle weakness (generalized)  Stiffness of left knee, not elsewhere classified  Chronic bilateral low back pain, with sciatica presence unspecified  Stiffness of right knee, not elsewhere classified     Problem List There are no active problems to display for this patient.   Starr Lake PT, DPT, LAT, ATC  05/17/18  2:19 PM      [facility] [address] [place], [place], [address] Phone: [phone]   Fax:  [phone]  Name: Kathleen [name] MRN: [id number] Date of Birth: Apr 21, 1941

## 2018-05-20 ENCOUNTER — Ambulatory Visit: Payer: Medicare Other

## 2018-05-20 DIAGNOSIS — M25662 Stiffness of left knee, not elsewhere classified: Secondary | ICD-10-CM

## 2018-05-20 DIAGNOSIS — M545 Low back pain: Secondary | ICD-10-CM

## 2018-05-20 DIAGNOSIS — M25661 Stiffness of right knee, not elsewhere classified: Secondary | ICD-10-CM

## 2018-05-20 DIAGNOSIS — M6281 Muscle weakness (generalized): Secondary | ICD-10-CM

## 2018-05-20 DIAGNOSIS — G8929 Other chronic pain: Secondary | ICD-10-CM

## 2018-05-20 NOTE — Therapy (Signed)
[place], [place], [address] Phone: [phone]   Fax:  [phone]  Physical Therapy Treatment  Patient Details  Name: Kathleen [name] MRN: [id number] Date of Birth: April 16, 1941 Referring Provider: Garwin Brothers, MD   Encounter Date: 05/20/2018  PT End of Session - 05/20/18 1400    Visit Number  23    Number of Visits  33    Date for PT Re-Evaluation  06/25/18    Authorization Type  [employer] [place] at visit 15  progress visit 27    PT Start Time  1255    PT Stop Time  1350    PT Time Calculation (min)  55 min    Equipment Utilized During Treatment  Gait belt    Activity Tolerance  Patient tolerated treatment well    Behavior During Therapy  [facility] for tasks assessed/performed       No past medical history on file.  No past surgical history on file.  There were no vitals filed for this visit.  Subjective Assessment - 05/20/18 1318    Subjective  Continued foot pain as last session.  Doing ok otherwise    Currently in Pain?  Yes    Pain Score  4     Pain Location  Foot    Pain Orientation  Left;Right                       [facility] Adult PT Treatment/Exercise - 05/20/18 0001      Ambulation/Gait   Ambulation Distance (Feet)  30 Feet    Assistive device  Rolling walker    Gait Pattern  Step-through pattern    Ramp  5: Supervision   CG but no assist   Ramp Details (indicate cue type and reason)  used incline from parking lot  ascend and descend      Curb  Other (comment)    Gait Comments  Her trunk remains flexed , We       Lumbar Exercises: Standing   Other Standing Lumbar Exercises  Worked on stepping to 6 in ( unable ) then 4 inch height step to work on possible stepping up curb in community. With assist we were able to get each foot seperatly  to 4 inch step but she was not ale to  do this on own.  She wa able to get LT foot fully on 2 inch step and step up /dpown  without assisit with CG      Lumbar Exercises: Seated   Long Arc Quad on Chair  Right;Left;20 reps    LAQ on Chair Weights (lbs)  5             PT Education - 05/20/18 1359    Education Details  Discussed need for INC hip strength to be able to step on step and linmitations with decr ROm making this more difficult    Person(s) Educated  Patient    Methods  Explanation    Comprehension  Verbalized understanding       PT Short Term Goals - 05/11/18 1526      PT SHORT TERM GOAL #1   Title  She will be independent in initial HEP    Status  Achieved      PT SHORT TERM GOAL #2   Title  She will be able to transfer with board with min  to no assistance    Baseline  now using walker and walking to sit in and out of bed    Status  Achieved      PT SHORT TERM GOAL #3   Title  She will be able to pull herself to stand in parallel bars    Status  Achieved      PT SHORT TERM GOAL #4   Title  She will be able to take 10 step in bars with min to mod assist.     Status  Achieved      PT SHORT TERM GOAL #5   Title  She will be able to stand for 5 min  in bars    Status  Deferred      PT SHORT TERM GOAL #6   Title  She will have no increase in leg pain    Baseline  only reported knee pain no incr in leg pain    Status  Achieved        PT Long Term Goals - 05/20/18 1404      PT LONG TERM GOAL #1   Title  She will be able to stand to walker with minor assist of one    Status  Achieved      PT LONG TERM GOAL #2   Title  She will be able to walk 50 feet with RW for home ambulation with +1 contact guard assist.     Status  Achieved      PT LONG TERM GOAL #3   Title  She will be able to stand to walker with +1 min assist from wheel chair.     Status  Achieved      PT LONG TERM GOAL #4   Title  She will be able to get herself in and out of bed with +1 min assist with assist of bed     Baseline  Need mod assist for legs to get in bed and min assist but verbal cues to get out     Status  Partially Met      PT LONG TERM GOAL #5   Title  she will report no increase in leg pain    Baseline  does not really change    Status  Achieved      PT LONG TERM GOAL #6   Title  With supervision and walker will be able to walk up incline 20 feet     Status  Achieved      PT LONG TERM GOAL #7   Title  She will be able to get herself in bed with no assist    Status  On-going      PT LONG TERM GOAL #8   Title  She will be able to place walker on sidewalk and step up on curb safely to access community    Status  On-going            Plan - 05/20/18 1401    PT Treatment/Interventions  Moist Heat;Therapeutic activities;Patient/family education;Functional mobility training;Therapeutic exercise;Gait training;Manual techniques;Wheelchair mobility training;Passive range of motion;DME Instruction;ADLs/Self Care Home Management    PT Next Visit Plan   Walking, hip / knee strength, standing posture, endurance training. Mat exercises, ankle strengthening, gait ramp and  platform /step    PT Home Exercise Plan  bridge , rolling , hip  abduction and SLR with assist    Consulted and Agree with Plan of Care  Patient  Patient will benefit from skilled therapeutic intervention in order to improve the following deficits and impairments:  Pain, Postural dysfunction, Decreased activity tolerance, Decreased endurance, Decreased range of motion, Decreased strength, Decreased balance  Visit Diagnosis: Muscle weakness (generalized)  Stiffness of left knee, not elsewhere classified  Chronic bilateral low back pain, with sciatica presence unspecified  Stiffness of right knee, not elsewhere classified     Problem List There are no active problems to display for this patient.   [name]  PT 05/20/2018, 2:05 PM  [facility] [address] [place], [place], [address] Phone: [phone]   Fax:  [phone]  Name:  Kathleen [name] MRN: [id number] Date of Birth: 01-Mar-1941

## 2018-05-24 ENCOUNTER — Encounter: Payer: Self-pay | Admitting: Physical Therapy

## 2018-05-24 ENCOUNTER — Ambulatory Visit: Payer: Medicare Other | Admitting: Physical Therapy

## 2018-05-24 DIAGNOSIS — G8929 Other chronic pain: Secondary | ICD-10-CM

## 2018-05-24 DIAGNOSIS — M25662 Stiffness of left knee, not elsewhere classified: Secondary | ICD-10-CM

## 2018-05-24 DIAGNOSIS — M6281 Muscle weakness (generalized): Secondary | ICD-10-CM | POA: Diagnosis not present

## 2018-05-24 DIAGNOSIS — M545 Low back pain: Secondary | ICD-10-CM

## 2018-05-24 DIAGNOSIS — M25661 Stiffness of right knee, not elsewhere classified: Secondary | ICD-10-CM

## 2018-05-24 NOTE — Therapy (Signed)
[place], [place], [address] Phone: [phone]   Fax:  [phone]  Physical Therapy Treatment  Patient Details  Name: Kathleen [name] MRN: [id number] Date of Birth: 1941-06-07 Referring Provider: Garwin Brothers, MD   Encounter Date: 05/24/2018  PT End of Session - 05/24/18 1343    Visit Number  24    Number of Visits  33    Date for PT Re-Evaluation  06/25/18    Authorization Type  [employer] [place] at visit 15  progress visit 27    PT Start Time  1346    Equipment Utilized During Treatment  Gait belt    Activity Tolerance  Patient tolerated treatment well    Behavior During Therapy  [facility] for tasks assessed/performed       History reviewed. No pertinent past medical history.  History reviewed. No pertinent surgical history.  There were no vitals filed for this visit.  Subjective Assessment - 05/24/18 1342    Subjective  "some foot pain, but doing okay"     Currently in Pain?  Yes    Pain Score  4     Pain Location  Foot    Pain Orientation  Right;Left    Pain Descriptors / Indicators  Tingling    Pain Type  Chronic pain    Pain Frequency  Constant    Aggravating Factors   weight bearing, walking                       [facility] Adult PT Treatment/Exercise - 05/24/18 0001      Lumbar Exercises: Seated   Long Arc Quad on Chair  Right;Left;20 reps    LAQ on Chair Weights (lbs)  5    Sit to Stand  15 reps   from elevated table cues for hand placement      Knee/Hip Exercises: Standing   Gait Training  pt ambulated outside down / up incline first time ambulating 72 ft, and second 53 ft                PT Short Term Goals - 05/11/18 1526      PT SHORT TERM GOAL #1   Title  She will be independent in initial HEP    Status  Achieved      PT SHORT TERM GOAL #2   Title  She will be able to transfer with board with min to no assistance    Baseline  now using  walker and walking to sit in and out of bed    Status  Achieved      PT SHORT TERM GOAL #3   Title  She will be able to pull herself to stand in parallel bars    Status  Achieved      PT SHORT TERM GOAL #4   Title  She will be able to take 10 step in bars with min to mod assist.     Status  Achieved      PT SHORT TERM GOAL #5   Title  She will be able to stand for 5 min  in bars    Status  Deferred      PT SHORT TERM GOAL #6   Title  She will have no increase in leg pain    Baseline  only reported knee pain no incr in leg pain  Status  Achieved        PT Long Term Goals - 05/20/18 1404      PT LONG TERM GOAL #1   Title  She will be able to stand to walker with minor assist of one    Status  Achieved      PT LONG TERM GOAL #2   Title  She will be able to walk 50 feet with RW for home ambulation with +1 contact guard assist.     Status  Achieved      PT LONG TERM GOAL #3   Title  She will be able to stand to walker with +1 min assist from wheel chair.     Status  Achieved      PT LONG TERM GOAL #4   Title  She will be able to get herself in and out of bed with +1 min assist with assist of bed     Baseline  Need mod assist for legs to get in bed and min assist but verbal cues to get out    Status  Partially Met      PT LONG TERM GOAL #5   Title  she will report no increase in leg pain    Baseline  does not really change    Status  Achieved      PT LONG TERM GOAL #6   Title  With supervision and walker will be able to walk up incline 20 feet     Status  Achieved      PT LONG TERM GOAL #7   Title  She will be able to get herself in bed with no assist    Status  On-going      PT LONG TERM GOAL #8   Title  She will be able to place walker on sidewalk and step up on curb safely to access community    Status  On-going            Plan - 05/24/18 1433    Clinical Impression Statement  Cotninued walking on inclines today which she was able to ambulate ~125 ft  with one rest break. continued working on hip/ knee strength following gait training. no increaes in pain, but continued cues for motiviation and proper technique with sit to stand.     PT Treatment/Interventions  Moist Heat;Therapeutic activities;Patient/family education;Functional mobility training;Therapeutic exercise;Gait training;Manual techniques;Wheelchair mobility training;Passive range of motion;DME Instruction;ADLs/Self Care Home Management    PT Next Visit Plan   Walking, hip / knee strength, standing posture, endurance training. Mat exercises, ankle strengthening, gait ramp and  platform /step    PT Home Exercise Plan  bridge , rolling , hip  abduction and SLR with assist    Consulted and Agree with Plan of Care  Patient       Patient will benefit from skilled therapeutic intervention in order to improve the following deficits and impairments:  Pain, Postural dysfunction, Decreased activity tolerance, Decreased endurance, Decreased range of motion, Decreased strength, Decreased balance  Visit Diagnosis: Muscle weakness (generalized)  Stiffness of left knee, not elsewhere classified  Chronic bilateral low back pain, with sciatica presence unspecified  Stiffness of right knee, not elsewhere classified     Problem List There are no active problems to display for this patient.  Starr Lake PT, DPT, LAT, ATC  05/24/18  2:35 PM      [facility] [address] [place], [place], [address] Phone: [phone]  Fax:  [phone]  Name: Kathleen [name] MRN: [id number] Date of Birth: 16-Sep-1941

## 2018-05-28 ENCOUNTER — Encounter: Payer: Self-pay | Admitting: Physical Therapy

## 2018-05-28 ENCOUNTER — Ambulatory Visit: Payer: Medicare Other | Admitting: Physical Therapy

## 2018-05-28 DIAGNOSIS — G8929 Other chronic pain: Secondary | ICD-10-CM

## 2018-05-28 DIAGNOSIS — M25662 Stiffness of left knee, not elsewhere classified: Secondary | ICD-10-CM

## 2018-05-28 DIAGNOSIS — M6281 Muscle weakness (generalized): Secondary | ICD-10-CM

## 2018-05-28 DIAGNOSIS — M25661 Stiffness of right knee, not elsewhere classified: Secondary | ICD-10-CM

## 2018-05-28 DIAGNOSIS — M545 Low back pain: Secondary | ICD-10-CM

## 2018-05-28 NOTE — Therapy (Signed)
[name], [place], [address] Phone: [phone]   Fax:  [phone]  Physical Therapy Treatment  Patient Details  Name: Kathleen [name] MRN: [id number] Date of Birth: [birth year] Referring Provider: Garwin Brothers, MD   Encounter Date: 05/28/2018  PT End of Session - 05/28/18 0857    Visit Number  25    Number of Visits  33    Date for PT Re-Evaluation  06/25/18    Authorization Type  [employer] [place] at visit 15  progress visit 27    PT Start Time  0900    PT Stop Time  0945    PT Time Calculation (min)  45 min    Activity Tolerance  Patient tolerated treatment well    Behavior During Therapy  [facility] for tasks assessed/performed       History reviewed. No pertinent past medical history.  History reviewed. No pertinent surgical history.  There were no vitals filed for this visit.  Subjective Assessment - 05/28/18 0959    Subjective  "my ankle is really sore but other than that I am ok"    Currently in Pain?  Yes    Pain Score  5     Pain Location  Ankle    Pain Orientation  Right;Left    Pain Descriptors / Indicators  Tingling    Pain Type  Chronic pain    Pain Onset  More than a month ago    Pain Frequency  Constant                       [facility] Adult PT Treatment/Exercise - 05/28/18 0001      Knee/Hip Exercises: Standing   Forward Step Up  2 sets;5 reps;Step Height: 4"   at stairs holding on to railing ,   Forward Step Up Limitations  keeping lead leg on step, pt able to independently place LLE on step but required assistance with RLE foot placement.     Gait Training  pt ambulated outside down / up incline 2 x 83 ft with RW      Knee/Hip Exercises: Seated   Other Seated Knee/Hip Exercises  hamstring curl 2 x 15 bil with red theradband               PT Short Term Goals - 05/11/18 1526      PT SHORT TERM GOAL #1   Title  She will be independent in  initial HEP    Status  Achieved      PT SHORT TERM GOAL #2   Title  She will be able to transfer with board with min to no assistance    Baseline  now using walker and walking to sit in and out of bed    Status  Achieved      PT SHORT TERM GOAL #3   Title  She will be able to pull herself to stand in parallel bars    Status  Achieved      PT SHORT TERM GOAL #4   Title  She will be able to take 10 step in bars with min to mod assist.     Status  Achieved      PT SHORT TERM GOAL #5   Title  She will be able to stand for 5 min  in bars    Status  Deferred  PT SHORT TERM GOAL #6   Title  She will have no increase in leg pain    Baseline  only reported knee pain no incr in leg pain    Status  Achieved        PT Long Term Goals - 05/20/18 1404      PT LONG TERM GOAL #1   Title  She will be able to stand to walker with minor assist of one    Status  Achieved      PT LONG TERM GOAL #2   Title  She will be able to walk 50 feet with RW for home ambulation with +1 contact guard assist.     Status  Achieved      PT LONG TERM GOAL #3   Title  She will be able to stand to walker with +1 min assist from wheel chair.     Status  Achieved      PT LONG TERM GOAL #4   Title  She will be able to get herself in and out of bed with +1 min assist with assist of bed     Baseline  Need mod assist for legs to get in bed and min assist but verbal cues to get out    Status  Partially Met      PT LONG TERM GOAL #5   Title  she will report no increase in leg pain    Baseline  does not really change    Status  Achieved      PT LONG TERM GOAL #6   Title  With supervision and walker will be able to walk up incline 20 feet     Status  Achieved      PT LONG TERM GOAL #7   Title  She will be able to get herself in bed with no assist    Status  On-going      PT LONG TERM GOAL #8   Title  She will be able to place walker on sidewalk and step up on curb safely to access community    Status   On-going            Plan - 05/28/18 0947    Clinical Impression Statement  pt reports continued pain inthe ankles today. continued gait training along incline outside which she ambulate 2 x combined total of 166 ft with RW. continued working on hamstring activation to promote knee flexion and step up with lead leg staying on step using 4 inch stairs. She did require manual assist with RLE placing foot on the step, and was able to place LLE independently. end of session she reported feeling tired and sore from todays session.     PT Treatment/Interventions  Moist Heat;Therapeutic activities;Patient/family education;Functional mobility training;Therapeutic exercise;Gait training;Manual techniques;Wheelchair mobility training;Passive range of motion;DME Instruction;ADLs/Self Care Home Management    PT Next Visit Plan   Walking, hip / knee strength, standing posture, endurance training. Mat exercises, ankle strengthening, gait ramp and  platform /step    PT Home Exercise Plan  bridge , rolling , hip  abduction and SLR with assist    Consulted and Agree with Plan of Care  Patient       Patient will benefit from skilled therapeutic intervention in order to improve the following deficits and impairments:  Pain, Postural dysfunction, Decreased activity tolerance, Decreased endurance, Decreased range of motion, Decreased strength, Decreased balance  Visit Diagnosis: Muscle weakness (generalized)  Stiffness of left knee,  not elsewhere classified  Chronic bilateral low back pain, with sciatica presence unspecified  Stiffness of right knee, not elsewhere classified     Problem List There are no active problems to display for this patient.  Kathleen [name] PT, DPT, LAT, ATC  05/28/18  10:01 AM      [facility] [address] [place], [place], [address] Phone: [phone]   Fax:  [phone]  Name: Kathleen [name] MRN:  [id number] Date of Birth: 07/30/1941

## 2018-06-01 ENCOUNTER — Ambulatory Visit: Payer: Medicare Other | Attending: Internal Medicine

## 2018-06-01 DIAGNOSIS — M545 Low back pain: Secondary | ICD-10-CM | POA: Diagnosis present

## 2018-06-01 DIAGNOSIS — M6281 Muscle weakness (generalized): Secondary | ICD-10-CM | POA: Diagnosis present

## 2018-06-01 DIAGNOSIS — M25512 Pain in left shoulder: Secondary | ICD-10-CM | POA: Diagnosis present

## 2018-06-01 DIAGNOSIS — M25662 Stiffness of left knee, not elsewhere classified: Secondary | ICD-10-CM | POA: Diagnosis present

## 2018-06-01 DIAGNOSIS — G8929 Other chronic pain: Secondary | ICD-10-CM

## 2018-06-01 DIAGNOSIS — M25511 Pain in right shoulder: Secondary | ICD-10-CM | POA: Insufficient documentation

## 2018-06-01 DIAGNOSIS — M25661 Stiffness of right knee, not elsewhere classified: Secondary | ICD-10-CM | POA: Diagnosis present

## 2018-06-01 NOTE — Therapy (Signed)
[place], [place], [address] Phone: [phone]   Fax:  [phone]  Physical Therapy Treatment  Patient Details  Name: Kathleen [name] MRN: [id number] Date of Birth: [birth year] Referring Provider: Garwin Brothers, MD   Encounter Date: 06/01/2018  PT End of Session - 06/01/18 1258    Visit Number  26    Number of Visits  33    Date for PT Re-Evaluation  06/25/18    Authorization Type  [employer] [place] at visit 15  progress visit 27    PT Start Time  0100    PT Stop Time  0200    PT Time Calculation (min)  60 min    Activity Tolerance  Patient tolerated treatment well    Behavior During Therapy  [facility] for tasks assessed/performed       History reviewed. No pertinent past medical history.  History reviewed. No pertinent surgical history.  There were no vitals filed for this visit.  Subjective Assessment - 06/01/18 1301    Subjective  Doing alright.  Went in [facility] home and entered by the back door and used walker to enter. Used lift chair and also used walker to get into BR.  She needed to get help with self care in BR.    Managed different surfaces without help.        Pain Score  5     Pain Location  Foot    Pain Orientation  Left;Right    Pain Descriptors / Indicators  Aching;Tingling    Pain Type  Chronic pain    Pain Radiating Towards  at times into legs    Pain Onset  More than a month ago    Pain Frequency  Constant    Aggravating Factors   walking                       [facility] Adult PT Treatment/Exercise - 06/01/18 0001      Ambulation/Gait   Gait Comments  walked from the lobby with RW SBA 60 feet in and out clinic      Therapeutic Activites    Other Therapeutic Activities  Worked on sit to stand and scootoing hpips and back and getting in and out pof bed . Needs assist with legs . Getting supine to sitr she uses a pull on my arm but able to do without  assistance.       Lumbar Exercises: Seated   Long Arc Quad on Chair  Right;Left;20 reps    Sit to Stand  5 reps   from 20 inch height   Other Seated Lumbar Exercises  then worked on holding hands up with longest time 18 seconds      Lumbar Exercises: Supine   Bridge  20 reps   second set with 5 sec hold   Straight Leg Raise  10 reps;Limitations    Straight Leg Raises Limitations  Assisted 2 sets , appears to be lifting with more strength bilaterally.                PT Short Term Goals - 05/11/18 1526      PT SHORT TERM GOAL #1   Title  She will be independent in initial HEP    Status  Achieved      PT SHORT TERM GOAL #2   Title  She will be able  to transfer with board with min to no assistance    Baseline  now using walker and walking to sit in and out of bed    Status  Achieved      PT SHORT TERM GOAL #3   Title  She will be able to pull herself to stand in parallel bars    Status  Achieved      PT SHORT TERM GOAL #4   Title  She will be able to take 10 step in bars with min to mod assist.     Status  Achieved      PT SHORT TERM GOAL #5   Title  She will be able to stand for 5 min  in bars    Status  Deferred      PT SHORT TERM GOAL #6   Title  She will have no increase in leg pain    Baseline  only reported knee pain no incr in leg pain    Status  Achieved        PT Long Term Goals - 05/20/18 1404      PT LONG TERM GOAL #1   Title  She will be able to stand to walker with minor assist of one    Status  Achieved      PT LONG TERM GOAL #2   Title  She will be able to walk 50 feet with RW for home ambulation with +1 contact guard assist.     Status  Achieved      PT LONG TERM GOAL #3   Title  She will be able to stand to walker with +1 min assist from wheel chair.     Status  Achieved      PT LONG TERM GOAL #4   Title  She will be able to get herself in and out of bed with +1 min assist with assist of bed     Baseline  Need mod assist for legs to  get in bed and min assist but verbal cues to get out    Status  Partially Met      PT LONG TERM GOAL #5   Title  she will report no increase in leg pain    Baseline  does not really change    Status  Achieved      PT LONG TERM GOAL #6   Title  With supervision and walker will be able to walk up incline 20 feet     Status  Achieved      PT LONG TERM GOAL #7   Title  She will be able to get herself in bed with no assist    Status  On-going      PT LONG TERM GOAL #8   Title  She will be able to place walker on sidewalk and step up on curb safely to access community    Status  On-going            Plan - 06/01/18 1259    Clinical Impression Statement  She is doing better with sit to stand and walking no real need to CG now. She was able to lift each leg  better though needed assist. She does not appear to be able to get into bed without assist and if she goes home it would appear she will need somne level of assist  for bathing getting in and out of bed and toileting    PT Treatment/Interventions  Moist Heat;Therapeutic  activities;Patient/family education;Functional mobility training;Therapeutic exercise;Gait training;Manual techniques;Wheelchair mobility training;Passive range of motion;DME Instruction;ADLs/Self Care Home Management    PT Next Visit Plan   Walking, hip / knee strength, standing posture, endurance training. Mat exercises, ankle strengthening, gait ramp and  platform /step    PT Home Exercise Plan  bridge , rolling , hip  abduction and SLR with assist    Consulted and Agree with Plan of Care  Patient       Patient will benefit from skilled therapeutic intervention in order to improve the following deficits and impairments:  Pain, Postural dysfunction, Decreased activity tolerance, Decreased endurance, Decreased range of motion, Decreased strength, Decreased balance  Visit Diagnosis: Muscle weakness (generalized)  Stiffness of left knee, not elsewhere  classified  Chronic bilateral low back pain, with sciatica presence unspecified  Stiffness of right knee, not elsewhere classified     Problem List There are no active problems to display for this patient.   [name]  PT 06/01/2018, 2:12 PM  [facility] [address] [place], [place], [address] Phone: [phone]   Fax:  [phone]  Name: Kathleen [name] MRN: [id number] Date of Birth: 1940/11/07

## 2018-06-03 ENCOUNTER — Ambulatory Visit: Payer: Medicare Other

## 2018-06-03 DIAGNOSIS — M25662 Stiffness of left knee, not elsewhere classified: Secondary | ICD-10-CM

## 2018-06-03 DIAGNOSIS — M25661 Stiffness of right knee, not elsewhere classified: Secondary | ICD-10-CM

## 2018-06-03 DIAGNOSIS — M6281 Muscle weakness (generalized): Secondary | ICD-10-CM

## 2018-06-03 NOTE — Therapy (Signed)
[place], [place], [address] Phone: [phone]   Fax:  [phone]  Physical Therapy Treatment  Patient Details  Name: Kathleen [name] MRN: [id number] Date of Birth: 1940-12-08 Referring Provider: Garwin Brothers, MD   Encounter Date: 06/03/2018  PT End of Session - 06/03/18 1323    Visit Number  27    Number of Visits  33    Date for PT Re-Evaluation  06/25/18    Authorization Type  [employer] [place] at visit 15  progress visit 27    PT Start Time  0100    PT Stop Time  0215    PT Time Calculation (min)  75 min    Equipment Utilized During Treatment  Gait belt    Activity Tolerance  Patient tolerated treatment well    Behavior During Therapy  [facility] for tasks assessed/performed       History reviewed. No pertinent past medical history.  History reviewed. No pertinent surgical history.  There were no vitals filed for this visit.  Subjective Assessment - 06/03/18 1322    Subjective  No new complaints    Currently in Pain?  Yes    Pain Score  5     Pain Location  Foot    Pain Orientation  Right;Left                       [facility] Adult PT Treatment/Exercise - 06/03/18 0001      Ambulation/Gait   Stairs  Yes    Stair Management Technique  Two rails;Step to pattern   ascending then one step descnet backward.   4 sets with rest   Gait Comments  walked from the lobby with RW SBA 130 feet then 5 min rest walked 40 feet to bars for work on stepping up to step      Self-Care   Self-Care  ADL's    ADL's  Discussed with daughter limitaitons and modifications for hoem to provide for transition to her home or Pts home . Daughter remarked maybe handicapped modified apartment may be long term best choice to be near family. I discussed riskd of being alone and she is aswware of these. She has been very helpful/.                PT Short Term Goals - 05/11/18 1526      PT SHORT  TERM GOAL #1   Title  She will be independent in initial HEP    Status  Achieved      PT SHORT TERM GOAL #2   Title  She will be able to transfer with board with min to no assistance    Baseline  now using walker and walking to sit in and out of bed    Status  Achieved      PT SHORT TERM GOAL #3   Title  She will be able to pull herself to stand in parallel bars    Status  Achieved      PT SHORT TERM GOAL #4   Title  She will be able to take 10 step in bars with min to mod assist.     Status  Achieved      PT SHORT TERM GOAL #5   Title  She will be able to stand for 5 min  in bars    Status  Deferred      PT SHORT TERM GOAL #6   Title  She will have no increase in leg pain    Baseline  only reported knee pain no incr in leg pain    Status  Achieved        PT Long Term Goals - 05/20/18 1404      PT LONG TERM GOAL #1   Title  She will be able to stand to walker with minor assist of one    Status  Achieved      PT LONG TERM GOAL #2   Title  She will be able to walk 50 feet with RW for home ambulation with +1 contact guard assist.     Status  Achieved      PT LONG TERM GOAL #3   Title  She will be able to stand to walker with +1 min assist from wheel chair.     Status  Achieved      PT LONG TERM GOAL #4   Title  She will be able to get herself in and out of bed with +1 min assist with assist of bed     Baseline  Need mod assist for legs to get in bed and min assist but verbal cues to get out    Status  Partially Met      PT LONG TERM GOAL #5   Title  she will report no increase in leg pain    Baseline  does not really change    Status  Achieved      PT LONG TERM GOAL #6   Title  With supervision and walker will be able to walk up incline 20 feet     Status  Achieved      PT LONG TERM GOAL #7   Title  She will be able to get herself in bed with no assist    Status  On-going      PT LONG TERM GOAL #8   Title  She will be able to place walker on sidewalk and  step up on curb safely to access community    Status  On-going            Plan - 06/03/18 1421    Clinical Impression Statement  She was able to walk up and down stairs 4 inch with 2 rails  . This was hard but she did this. Her daughter is aware of safetly and limtations for return to home. we will work on steps and modified dressing in next few sessions.  All activity with extended time frames.     PT Treatment/Interventions  Moist Heat;Therapeutic activities;Patient/family education;Functional mobility training;Therapeutic exercise;Gait training;Manual techniques;Wheelchair mobility training;Passive range of motion;DME Instruction;ADLs/Self Care Home Management    PT Next Visit Plan   Walking, hip / knee strength, standing posture, endurance training. Mat exercises, ankle strengthening, gait ramp and  stairs 4 In , miimic movements to stand and lift an dlower pants and dress with sock assist device    PT Home Exercise Plan  bridge , rolling , hip  abduction and SLR with assist    Consulted and Agree with Plan of Care  Patient    Family Member Consulted  daughter       Patient will benefit from skilled therapeutic intervention in order to improve the following deficits and impairments:  Pain, Postural dysfunction, Decreased activity tolerance, Decreased endurance, Decreased range of motion, Decreased strength, Decreased balance  Visit Diagnosis: Muscle  weakness (generalized)  Stiffness of left knee, not elsewhere classified  Stiffness of right knee, not elsewhere classified     Problem List There are no active problems to display for this patient.   [name]  PT 06/03/2018, 3:04 PM  [facility] [address] [place], [place], [address] Phone: [phone]   Fax:  [phone]  Name: Kathleen [name] MRN: [id number] Date of Birth: 1940/11/04

## 2018-06-08 ENCOUNTER — Ambulatory Visit: Payer: Medicare Other

## 2018-06-08 DIAGNOSIS — M25662 Stiffness of left knee, not elsewhere classified: Secondary | ICD-10-CM

## 2018-06-08 DIAGNOSIS — M545 Low back pain: Secondary | ICD-10-CM

## 2018-06-08 DIAGNOSIS — M6281 Muscle weakness (generalized): Secondary | ICD-10-CM

## 2018-06-08 DIAGNOSIS — M25512 Pain in left shoulder: Secondary | ICD-10-CM

## 2018-06-08 DIAGNOSIS — M25511 Pain in right shoulder: Secondary | ICD-10-CM

## 2018-06-08 DIAGNOSIS — G8929 Other chronic pain: Secondary | ICD-10-CM

## 2018-06-08 DIAGNOSIS — M25661 Stiffness of right knee, not elsewhere classified: Secondary | ICD-10-CM

## 2018-06-08 NOTE — Therapy (Signed)
[place], [place], [address] Phone: [phone]   Fax:  [phone]  Physical Therapy Treatment  Patient Details  Name: Kathleen [name] MRN: [id number] Date of Birth: September 14, 1941 Referring Provider: Garwin Brothers, MD   Encounter Date: 06/08/2018  PT End of Session - 06/08/18 1403    Visit Number  28    Number of Visits  33    Date for PT Re-Evaluation  06/25/18    Authorization Type  [employer] [place] at visit 15  progress visit 27    PT Start Time  0102    PT Stop Time  0202    PT Time Calculation (min)  60 min    Equipment Utilized During Treatment  Gait belt    Activity Tolerance  Patient tolerated treatment well;Patient limited by pain    Behavior During Therapy  [facility] for tasks assessed/performed       History reviewed. No pertinent past medical history.  History reviewed. No pertinent surgical history.  There were no vitals filed for this visit.  Subjective Assessment - 06/08/18 1407    Subjective  Knees sore today. brought devices for dressing     Pain Score  5    knee mild bilaterally   Pain Location  Foot    Pain Orientation  Right;Left    Pain Descriptors / Indicators  Aching;Tingling    Pain Type  Chronic pain    Pain Onset  More than a month ago    Pain Frequency  Constant    Aggravating Factors   walk    Pain Relieving Factors  rest    Multiple Pain Sites  --   shoulders hurt after pulling oin stair rails                      [facility] Adult PT Treatment/Exercise - 06/08/18 0001      Ambulation/Gait   Stairs  Yes    Stair Management Technique  Two rails;Step to pattern    Number of Stairs  3    Height of Stairs  4    Gait Comments  walked from the lobby with RW SBA 100 feet in and out of clinic   Stairs forward up and bakward down.       Therapeutic Activites    Other Therapeutic Activities  Worked on don an ddoff socks and shoe with assitive devices.                  PT Short Term Goals - 05/11/18 1526      PT SHORT TERM GOAL #1   Title  She will be independent in initial HEP    Status  Achieved      PT SHORT TERM GOAL #2   Title  She will be able to transfer with board with min to no assistance    Baseline  now using walker and walking to sit in and out of bed    Status  Achieved      PT SHORT TERM GOAL #3   Title  She will be able to pull herself to stand in parallel bars    Status  Achieved      PT SHORT TERM GOAL #4   Title  She will be able to take 10 step in bars with min to mod assist.     Status  Achieved  PT SHORT TERM GOAL #5   Title  She will be able to stand for 5 min  in bars    Status  Deferred      PT SHORT TERM GOAL #6   Title  She will have no increase in leg pain    Baseline  only reported knee pain no incr in leg pain    Status  Achieved        PT Long Term Goals - 05/20/18 1404      PT LONG TERM GOAL #1   Title  She will be able to stand to walker with minor assist of one    Status  Achieved      PT LONG TERM GOAL #2   Title  She will be able to walk 50 feet with RW for home ambulation with +1 contact guard assist.     Status  Achieved      PT LONG TERM GOAL #3   Title  She will be able to stand to walker with +1 min assist from wheel chair.     Status  Achieved      PT LONG TERM GOAL #4   Title  She will be able to get herself in and out of bed with +1 min assist with assist of bed     Baseline  Need mod assist for legs to get in bed and min assist but verbal cues to get out    Status  Partially Met      PT LONG TERM GOAL #5   Title  she will report no increase in leg pain    Baseline  does not really change    Status  Achieved      PT LONG TERM GOAL #6   Title  With supervision and walker will be able to walk up incline 20 feet     Status  Achieved      PT LONG TERM GOAL #7   Title  She will be able to get herself in bed with no assist    Status  On-going      PT  LONG TERM GOAL #8   Title  She will be able to place walker on sidewalk and step up on curb safely to access community    Status  On-going            Plan - 06/08/18 1404    Clinical Impression Statement  She was only able to walk 3 steps due to shoulder pain today.   We worked on [employer] and [employer] and socks  with shoe horn and sock device . She was not able to bend sufficiently to pull either up with hands. This may improve with practice but if not she will need assist each day to Lower body dress.  All activity takes increased time   PT Treatment/Interventions  Moist Heat;Therapeutic activities;Patient/family education;Functional mobility training;Therapeutic exercise;Gait training;Manual techniques;Wheelchair mobility training;Passive range of motion;DME Instruction;ADLs/Self Care Home Management    PT Next Visit Plan   Walking, hip / knee strength, standing posture, endurance training. Mat exercises, ankle strengthening, gait ramp and  stairs 4 In , miimic movements to stand and lift an dlower pants and dress with sock assist device    PT Home Exercise Plan  bridge , rolling , hip  abduction and SLR with assist    Consulted and Agree with Plan of Care  Patient       Patient will benefit from skilled therapeutic  intervention in order to improve the following deficits and impairments:  Pain, Postural dysfunction, Decreased activity tolerance, Decreased endurance, Decreased range of motion, Decreased strength, Decreased balance  Visit Diagnosis: Muscle weakness (generalized)  Stiffness of left knee, not elsewhere classified  Stiffness of right knee, not elsewhere classified  Chronic bilateral low back pain, with sciatica presence unspecified  Left shoulder pain, unspecified chronicity  Chronic right shoulder pain     Problem List There are no active problems to display for this patient.   [name] PT 06/08/2018, 2:12 PM  [facility] [address] [place], [place], [address] Phone: [phone]   Fax:  [phone]  Name: Charina Fons MRN: [id number] Date of Birth: [birth year]

## 2018-06-10 ENCOUNTER — Ambulatory Visit: Payer: Medicare Other

## 2018-06-10 DIAGNOSIS — M25661 Stiffness of right knee, not elsewhere classified: Secondary | ICD-10-CM

## 2018-06-10 DIAGNOSIS — M545 Low back pain: Secondary | ICD-10-CM

## 2018-06-10 DIAGNOSIS — M6281 Muscle weakness (generalized): Secondary | ICD-10-CM | POA: Diagnosis not present

## 2018-06-10 DIAGNOSIS — M25662 Stiffness of left knee, not elsewhere classified: Secondary | ICD-10-CM

## 2018-06-10 DIAGNOSIS — G8929 Other chronic pain: Secondary | ICD-10-CM

## 2018-06-10 NOTE — Therapy (Signed)
[name], [place], [address] Phone: [phone]   Fax:  [phone]  Physical Therapy Treatment  Patient Details  Name: Kathleen [name] MRN: [id number] Date of Birth: 1940-12-30 Referring Provider: Garwin Brothers, MD   Encounter Date: 06/10/2018  PT End of Session - 06/10/18 0952    Visit Number  29    Number of Visits  33    Date for PT Re-Evaluation  06/25/18    Authorization Type  [employer] [place] at visit 15  progress visit 57    PT Start Time  0940   Pt late   PT Stop Time  1015    PT Time Calculation (min)  35 min    Equipment Utilized During Treatment  Gait belt    Activity Tolerance  Patient tolerated treatment well;Patient limited by pain       No past medical history on file.  No past surgical history on file.  There were no vitals filed for this visit.  Subjective Assessment - 06/10/18 0954    Subjective  back and sshoulder sore today. Not sure why.       Pain Score  5     Pain Location  Foot   back and RT shoulder.    Pain Orientation  Left;Right    Pain Descriptors / Indicators  Aching;Tingling    Pain Type  Chronic pain    Pain Onset  More than a month ago    Aggravating Factors   walk, lifting arms with weight , pushing down , standing    Pain Relieving Factors  sit/rest                       [facility] Adult PT Treatment/Exercise - 06/10/18 0001      Ambulation/Gait   Ambulation Distance (Feet)  125 Feet   then 100 feet to end session   Gait Comments  RW Mod independent. to bars      Therapeutic Activites    Other Therapeutic Activities  worked on movements miimic pulling up pants and reaching behind back for same.       Neuro Re-ed    Neuro Re-ed Details   stand no UE support x 15 , x30 sec       Lumbar Exercises: Standing   Other Standing Lumbar Exercises  Hip abduction stepping side to side x 20 RT/LT     Other Standing Lumbar Exercises   standing and march RT to Lt to RT x 10 and hip abduction.                 PT Short Term Goals - 05/11/18 1526      PT SHORT TERM GOAL #1   Title  She will be independent in initial HEP    Status  Achieved      PT SHORT TERM GOAL #2   Title  She will be able to transfer with board with min to no assistance    Baseline  now using walker and walking to sit in and out of bed    Status  Achieved      PT SHORT TERM GOAL #3   Title  She will be able to pull herself to stand in parallel bars    Status  Achieved      PT SHORT TERM GOAL #4   Title  She will  be able to take 10 step in bars with min to mod assist.     Status  Achieved      PT SHORT TERM GOAL #5   Title  She will be able to stand for 5 min  in bars    Status  Deferred      PT SHORT TERM GOAL #6   Title  She will have no increase in leg pain    Baseline  only reported knee pain no incr in leg pain    Status  Achieved        PT Long Term Goals - 05/20/18 1404      PT LONG TERM GOAL #1   Title  She will be able to stand to walker with minor assist of one    Status  Achieved      PT LONG TERM GOAL #2   Title  She will be able to walk 50 feet with RW for home ambulation with +1 contact guard assist.     Status  Achieved      PT LONG TERM GOAL #3   Title  She will be able to stand to walker with +1 min assist from wheel chair.     Status  Achieved      PT LONG TERM GOAL #4   Title  She will be able to get herself in and out of bed with +1 min assist with assist of bed     Baseline  Need mod assist for legs to get in bed and min assist but verbal cues to get out    Status  Partially Met      PT LONG TERM GOAL #5   Title  she will report no increase in leg pain    Baseline  does not really change    Status  Achieved      PT LONG TERM GOAL #6   Title  With supervision and walker will be able to walk up incline 20 feet     Status  Achieved      PT LONG TERM GOAL #7   Title  She will be able to get  herself in bed with no assist    Status  On-going      PT LONG TERM GOAL #8   Title  She will be able to place walker on sidewalk and step up on curb safely to access community    Status  On-going            Plan - 06/10/18 1024    Clinical Impression Statement  Late so shortened session so worked on balance and walking and LE strength.   Shoulder pain limited lifting with RT arm .  ACtive ROM of each arm with pain will limit pulling pant to waist      Clinical Impairments Affecting Rehab Potential  obesity , previous back surgery, significant LE weakness and stiffness of joints.  significnat core weakness    PT Treatment/Interventions  Moist Heat;Therapeutic activities;Patient/family education;Functional mobility training;Therapeutic exercise;Gait training;Manual techniques;Wheelchair mobility training;Passive range of motion;DME Instruction;ADLs/Self Care Home Management    PT Next Visit Plan   Walking, hip / knee strength, standing posture, endurance training. Mat exercises, ankle strengthening, gait ramp and  stairs 4 In , miimic movements to stand and lift an dlower pants and dress with sock assist device    PT Home Exercise Plan  bridge , rolling , hip  abduction and SLR with assist  Consulted and Agree with Plan of Care  Patient       Patient will benefit from skilled therapeutic intervention in order to improve the following deficits and impairments:  Pain, Postural dysfunction, Decreased activity tolerance, Decreased endurance, Decreased range of motion, Decreased strength, Decreased balance  Visit Diagnosis: Muscle weakness (generalized)  Chronic bilateral low back pain, with sciatica presence unspecified  Stiffness of right knee, not elsewhere classified  Stiffness of left knee, not elsewhere classified     Problem List There are no active problems to display for this patient.   Kathleen [name]  PT  06/10/2018, 10:31 AM  [facility] [address] [place], [place], [address] Phone: [phone]   Fax:  [phone]  Name: Kathleen [name] MRN: [id number] Date of Birth: May 26, 1941

## 2018-06-15 ENCOUNTER — Ambulatory Visit: Payer: Medicare Other

## 2018-06-15 DIAGNOSIS — M25662 Stiffness of left knee, not elsewhere classified: Secondary | ICD-10-CM

## 2018-06-15 DIAGNOSIS — G8929 Other chronic pain: Secondary | ICD-10-CM

## 2018-06-15 DIAGNOSIS — M545 Low back pain: Secondary | ICD-10-CM

## 2018-06-15 DIAGNOSIS — M6281 Muscle weakness (generalized): Secondary | ICD-10-CM | POA: Diagnosis not present

## 2018-06-15 DIAGNOSIS — M25661 Stiffness of right knee, not elsewhere classified: Secondary | ICD-10-CM

## 2018-06-15 NOTE — Therapy (Signed)
[place], [place], [address] Phone: [phone]   Fax:  [phone]  Physical Therapy Treatment  Patient Details  Name: Kathleen [name] MRN: [id number] Date of Birth: October 27, 1940 Referring Provider: Garwin Brothers, MD   Encounter Date: 06/15/2018  PT End of Session - 06/15/18 1318    Visit Number  30    Number of Visits  33    Date for PT Re-Evaluation  06/25/18    Authorization Type  [employer] [place] at visit 15  progress visit 27    PT Start Time  0105    PT Stop Time  0200    PT Time Calculation (min)  55 min    Equipment Utilized During Treatment  Gait belt    Activity Tolerance  Patient tolerated treatment well;Patient limited by pain    Behavior During Therapy  [facility] for tasks assessed/performed       History reviewed. No pertinent past medical history.  History reviewed. No pertinent surgical history.  There were no vitals filed for this visit.  Subjective Assessment - 06/15/18 1319    Subjective  Went to son's home this weekend for day.   Walked from car , up on short step, walked to BR with walker, son assisted with pants, , then used WC when leaving.   from walker. she got in to car with assist for legs      Patient is accompained by:  Family member   son   Currently in Pain?  Yes    Pain Score  5     Pain Location  Foot    Pain Orientation  Right;Left    Pain Descriptors / Indicators  Aching    Pain Type  Chronic pain    Pain Onset  More than a month ago    Pain Frequency  Constant                       [facility] Adult PT Treatment/Exercise - 06/15/18 0001      Transfers   Transfers  Sit to Stand    Sit to Stand  6: Modified independent (Device/Increase time);3: Mod assist;Other (comment)    Sit to Stand Details (indicate cue type and reason)  verbal cued for weight shifting forward and she wa s able form raised seat to stand from mat without UE assist and get  balance with walker. Marland Kitchen She was not able to do this out of chwheel chair      Ambulation/Gait   Ambulation Distance (Feet)  125 Feet    Stairs  Yes    Stairs Assistance  4: Min guard    Stair Management Technique  Two rails;Step to pattern    Number of Stairs  7    Height of Stairs  4      Therapeutic Activites    Other Therapeutic Activities  sit to stand to standing without UE support x 5       Neuro Re-ed    Neuro Re-ed Details   stand no UE support x 15 , x30 sec                PT Short Term Goals - 05/11/18 1526      PT SHORT TERM GOAL #1   Title  She will be independent in initial HEP    Status  Achieved  PT SHORT TERM GOAL #2   Title  She will be able to transfer with board with min to no assistance    Baseline  now using walker and walking to sit in and out of bed    Status  Achieved      PT SHORT TERM GOAL #3   Title  She will be able to pull herself to stand in parallel bars    Status  Achieved      PT SHORT TERM GOAL #4   Title  She will be able to take 10 step in bars with min to mod assist.     Status  Achieved      PT SHORT TERM GOAL #5   Title  She will be able to stand for 5 min  in bars    Status  Deferred      PT SHORT TERM GOAL #6   Title  She will have no increase in leg pain    Baseline  only reported knee pain no incr in leg pain    Status  Achieved        PT Long Term Goals - 05/20/18 1404      PT LONG TERM GOAL #1   Title  She will be able to stand to walker with minor assist of one    Status  Achieved      PT LONG TERM GOAL #2   Title  She will be able to walk 50 feet with RW for home ambulation with +1 contact guard assist.     Status  Achieved      PT LONG TERM GOAL #3   Title  She will be able to stand to walker with +1 min assist from wheel chair.     Status  Achieved      PT LONG TERM GOAL #4   Title  She will be able to get herself in and out of bed with +1 min assist with assist of bed     Baseline  Need mod  assist for legs to get in bed and min assist but verbal cues to get out    Status  Partially Met      PT LONG TERM GOAL #5   Title  she will report no increase in leg pain    Baseline  does not really change    Status  Achieved      PT LONG TERM GOAL #6   Title  With supervision and walker will be able to walk up incline 20 feet     Status  Achieved      PT LONG TERM GOAL #7   Title  She will be able to get herself in bed with no assist    Status  On-going      PT LONG TERM GOAL #8   Title  She will be able to place walker on sidewalk and step up on curb safely to access community    Status  On-going            Plan - 06/15/18 1318    Clinical Impression Statement  She was able to walk 7 steps with 2 rails 4 inch step and appeared may have been able to do more. She was more rested today as we did this first.   Spent time with son reviewing progress and modifications to home step to  improve independence with access  and possible returnt o her home and needs for this  PT Treatment/Interventions  Moist Heat;Therapeutic activities;Patient/family education;Functional mobility training;Therapeutic exercise;Gait training;Manual techniques;Wheelchair mobility training;Passive range of motion;DME Instruction;ADLs/Self Care Home Management    PT Next Visit Plan   Walking, hip / knee strength, standing posture, endurance training. Mat exercises, ankle strengthening, gait ramp and  stairs 4 In , miimic movements to stand and lift an dlower pants and dress with sock assist device    PT Home Exercise Plan  bridge , rolling , hip  abduction and SLR with assist    Consulted and Agree with Plan of Care  Patient       Patient will benefit from skilled therapeutic intervention in order to improve the following deficits and impairments:  Pain, Postural dysfunction, Decreased activity tolerance, Decreased endurance, Decreased range of motion, Decreased strength, Decreased balance  Visit  Diagnosis: Muscle weakness (generalized)  Chronic bilateral low back pain, with sciatica presence unspecified  Stiffness of right knee, not elsewhere classified  Stiffness of left knee, not elsewhere classified     Problem List There are no active problems to display for this patient.   [name]  PT 06/15/2018, 2:47 PM  [facility] [address] [place], [place], [address] Phone: [phone]   Fax:  [phone]  Name: Kathleen [name] MRN: [id number] Date of Birth: [birth year]

## 2018-06-17 ENCOUNTER — Ambulatory Visit: Payer: Medicare Other | Admitting: Physical Therapy

## 2018-06-17 ENCOUNTER — Encounter: Payer: Self-pay | Admitting: Physical Therapy

## 2018-06-17 DIAGNOSIS — M25661 Stiffness of right knee, not elsewhere classified: Secondary | ICD-10-CM

## 2018-06-17 DIAGNOSIS — G8929 Other chronic pain: Secondary | ICD-10-CM

## 2018-06-17 DIAGNOSIS — M6281 Muscle weakness (generalized): Secondary | ICD-10-CM | POA: Diagnosis not present

## 2018-06-17 DIAGNOSIS — M25662 Stiffness of left knee, not elsewhere classified: Secondary | ICD-10-CM

## 2018-06-17 DIAGNOSIS — M545 Low back pain: Secondary | ICD-10-CM

## 2018-06-17 NOTE — Therapy (Signed)
[place], [place], [address] Phone: [phone]   Fax:  [phone]  Physical Therapy Treatment  Patient Details  Name: Kathleen [name] MRN: [id number] Date of Birth: 11/01/1940 Referring Provider: Garwin Brothers, MD   Encounter Date: 06/17/2018  PT End of Session - 06/17/18 1104    Visit Number  31    Number of Visits  33    Date for PT Re-Evaluation  06/25/18    Authorization Type  [employer] [place] at visit 15  progress visit 27    PT Start Time  1016    PT Stop Time  1101    PT Time Calculation (min)  45 min    Activity Tolerance  Patient tolerated treatment well    Behavior During Therapy  [facility] for tasks assessed/performed       History reviewed. No pertinent past medical history.  History reviewed. No pertinent surgical history.  There were no vitals filed for this visit.  Subjective Assessment - 06/17/18 1033    Subjective  "I am having some soreness in the ankles today, i am not really sure what it is but they haven't felt like this before. it is worse with standing on it, and sore only with sitting"     Currently in Pain?  Yes    Pain Score  5     Pain Location  Foot    Pain Orientation  Right;Left   R>L   Aggravating Factors   walking, / standing    Pain Relieving Factors  sitting/ resting                       [facility] Adult PT Treatment/Exercise - 06/17/18 0001      Exercises   Exercises  Knee/Hip;Ankle      Knee/Hip Exercises: Standing   Gait Training  ambulated 130 ft coming into clinic from waiting room into //, and ambulated back to the waiting room ~130 ft    Other Standing Knee Exercises  obstacle course lateral stepping through // x 4 (2 facing R, 2 facing L) stepping over Red lines, and added black 1/2 bolster stepping over    to mimic walking through doors     Knee/Hip Exercises: Seated   Other Seated Knee/Hip Exercises  hamstring curl 2 x 15 bil  with red theradband    Sit to Sand  2 sets;5 reps   from elevated surface but required UE assist     Ankle Exercises: Seated   ABC's  2 reps   bil 1 rep between each walking rep              PT Short Term Goals - 05/11/18 1526      PT SHORT TERM GOAL #1   Title  She will be independent in initial HEP    Status  Achieved      PT SHORT TERM GOAL #2   Title  She will be able to transfer with board with min to no assistance    Baseline  now using walker and walking to sit in and out of bed    Status  Achieved      PT SHORT TERM GOAL #3   Title  She will be able to pull herself to stand in parallel bars    Status  Achieved      PT SHORT TERM GOAL #  4   Title  She will be able to take 10 step in bars with min to mod assist.     Status  Achieved      PT SHORT TERM GOAL #5   Title  She will be able to stand for 5 min  in bars    Status  Deferred      PT SHORT TERM GOAL #6   Title  She will have no increase in leg pain    Baseline  only reported knee pain no incr in leg pain    Status  Achieved        PT Long Term Goals - 05/20/18 1404      PT LONG TERM GOAL #1   Title  She will be able to stand to walker with minor assist of one    Status  Achieved      PT LONG TERM GOAL #2   Title  She will be able to walk 50 feet with RW for home ambulation with +1 contact guard assist.     Status  Achieved      PT LONG TERM GOAL #3   Title  She will be able to stand to walker with +1 min assist from wheel chair.     Status  Achieved      PT LONG TERM GOAL #4   Title  She will be able to get herself in and out of bed with +1 min assist with assist of bed     Baseline  Need mod assist for legs to get in bed and min assist but verbal cues to get out    Status  Partially Met      PT LONG TERM GOAL #5   Title  she will report no increase in leg pain    Baseline  does not really change    Status  Achieved      PT LONG TERM GOAL #6   Title  With supervision and walker will  be able to walk up incline 20 feet     Status  Achieved      PT LONG TERM GOAL #7   Title  She will be able to get herself in bed with no assist    Status  On-going      PT LONG TERM GOAL #8   Title  She will be able to place walker on sidewalk and step up on curb safely to access community    Status  On-going            Plan - 06/17/18 Nelson  continued working on walking which she was able to amulate 130 ft x 2 required cues for motivation. she reported increased ankle pain today, practiced ankle mobility inbetween walking which she reported some improvement in pain. practied lateral stepping and stepping over obstacles to mimic stepping through narrow doorways. no increase in pain noted in the ankles.     PT Next Visit Plan   Walking, hip / knee strength, standing posture, endurance training. Mat exercises, ankle strengthening, gait ramp and  stairs 4 In , miimic movements to stand and lift an dlower pants and dress with sock assist device    PT Home Exercise Plan  bridge , rolling , hip  abduction and SLR with assist    Consulted and Agree with Plan of Care  Patient       Patient will benefit from skilled therapeutic intervention in  order to improve the following deficits and impairments:  Pain, Postural dysfunction, Decreased activity tolerance, Decreased endurance, Decreased range of motion, Decreased strength, Decreased balance  Visit Diagnosis: Muscle weakness (generalized)  Chronic bilateral low back pain, with sciatica presence unspecified  Stiffness of right knee, not elsewhere classified  Stiffness of left knee, not elsewhere classified     Problem List There are no active problems to display for this patient.  Starr Lake PT, DPT, LAT, ATC  06/17/18  11:08 AM      [facility] [address] [place], [place], [address] Phone: [phone]   Fax:  [phone]  Name:  Kathleen [name] MRN: [id number] Date of Birth: 1941-06-07

## 2018-06-22 ENCOUNTER — Encounter: Payer: Self-pay | Admitting: Physical Therapy

## 2018-06-22 ENCOUNTER — Ambulatory Visit: Payer: Medicare Other | Admitting: Physical Therapy

## 2018-06-22 DIAGNOSIS — M25662 Stiffness of left knee, not elsewhere classified: Secondary | ICD-10-CM

## 2018-06-22 DIAGNOSIS — M545 Low back pain: Secondary | ICD-10-CM

## 2018-06-22 DIAGNOSIS — G8929 Other chronic pain: Secondary | ICD-10-CM

## 2018-06-22 DIAGNOSIS — M6281 Muscle weakness (generalized): Secondary | ICD-10-CM | POA: Diagnosis not present

## 2018-06-22 DIAGNOSIS — M25661 Stiffness of right knee, not elsewhere classified: Secondary | ICD-10-CM

## 2018-06-22 NOTE — Therapy (Signed)
[place], [place], [address] Phone: [phone]   Fax:  [phone]  Physical Therapy Treatment  Patient Details  Name: Kathleen [name] MRN: [id number] Date of Birth: 06/12/1941 Referring Provider: Garwin Brothers, MD   Encounter Date: 06/22/2018  PT End of Session - 06/22/18 1348    Visit Number  32    Number of Visits  33    Date for PT Re-Evaluation  06/25/18    Authorization Type  [employer] [place] at visit 15  progress visit 27    PT Start Time  1350    PT Stop Time  1432    PT Time Calculation (min)  42 min    Activity Tolerance  Patient tolerated treatment well    Behavior During Therapy  [facility] for tasks assessed/performed       History reviewed. No pertinent past medical history.  History reviewed. No pertinent surgical history.  There were no vitals filed for this visit.  Subjective Assessment - 06/22/18 1437    Subjective  "my ankle is still pretty sore, I have been doing my exercises though"    Currently in Pain?  Yes    Pain Score  5     Pain Location  Foot    Pain Orientation  Right;Left    Pain Type  Chronic pain    Pain Onset  More than a month ago    Pain Frequency  Constant                       [facility] Adult PT Treatment/Exercise - 06/22/18 1429      Knee/Hip Exercises: Standing   Gait Training  pt ambulated down/ up incline in front of clinic x 120 ft.     Other Standing Knee Exercises  she was able to go up/ down curb in/down curb in front of clinic with RW (under covered area) 2 x up/ down she did require verbal cues and demosntrate on proper progression using RW to go up/ down the curb to maximize safety      Knee/Hip Exercises: Seated   Sit to Sand  2 sets;5 reps;with UE support   LUE on RW, RUE from table with table elvated         Balance Exercises - 06/22/18 1431      Balance Exercises: Standing   Other Standing Exercises  standing  balance in RW without HHA 4 x 20 sec hold          PT Short Term Goals - 05/11/18 1526      PT SHORT TERM GOAL #1   Title  She will be independent in initial HEP    Status  Achieved      PT SHORT TERM GOAL #2   Title  She will be able to transfer with board with min to no assistance    Baseline  now using walker and walking to sit in and out of bed    Status  Achieved      PT SHORT TERM GOAL #3   Title  She will be able to pull herself to stand in parallel bars    Status  Achieved      PT SHORT TERM GOAL #4   Title  She will be able to take 10 step in bars with min to mod assist.     Status  Achieved      PT SHORT TERM GOAL #5   Title  She will be able to stand for 5 min  in bars    Status  Deferred      PT SHORT TERM GOAL #6   Title  She will have no increase in leg pain    Baseline  only reported knee pain no incr in leg pain    Status  Achieved        PT Long Term Goals - 05/20/18 1404      PT LONG TERM GOAL #1   Title  She will be able to stand to walker with minor assist of one    Status  Achieved      PT LONG TERM GOAL #2   Title  She will be able to walk 50 feet with RW for home ambulation with +1 contact guard assist.     Status  Achieved      PT LONG TERM GOAL #3   Title  She will be able to stand to walker with +1 min assist from wheel chair.     Status  Achieved      PT LONG TERM GOAL #4   Title  She will be able to get herself in and out of bed with +1 min assist with assist of bed     Baseline  Need mod assist for legs to get in bed and min assist but verbal cues to get out    Status  Partially Met      PT LONG TERM GOAL #5   Title  she will report no increase in leg pain    Baseline  does not really change    Status  Achieved      PT LONG TERM GOAL #6   Title  With supervision and walker will be able to walk up incline 20 feet     Status  Achieved      PT LONG TERM GOAL #7   Title  She will be able to get herself in bed with no assist     Status  On-going      PT LONG TERM GOAL #8   Title  She will be able to place walker on sidewalk and step up on curb safely to access community    Status  On-going            Plan - 06/22/18 [place]  Mrs gattuso continues to report pain in the ankles. continued working on gait which she was able to ambulate outside up/ down incline ~120 ft, and navigate up/ down curb with RW 2x with max cues for proper use of RW. continued working on hip strength and standing balance in RW which she performed well.     PT Next Visit Plan   ERO, Walking, hip / knee strength, standing posture, endurance training. Mat exercises, ankle strengthening, gait ramp and  stairs 4 In , miimic movements to stand and lift an dlower pants and dress with sock assist device    PT Home Exercise Plan  bridge , rolling , hip  abduction and SLR with assist    Consulted and Agree with Plan of Care  Patient       Patient will benefit from skilled therapeutic intervention in order to improve the following deficits and impairments:  Pain, Postural dysfunction, Decreased activity tolerance, Decreased endurance, Decreased range of motion, Decreased strength, Decreased balance  Visit  Diagnosis: Muscle weakness (generalized)  Chronic bilateral low back pain, with sciatica presence unspecified  Stiffness of right knee, not elsewhere classified  Stiffness of left knee, not elsewhere classified     Problem List There are no active problems to display for this patient.  Starr Lake PT, DPT, LAT, ATC  06/22/18  2:38 PM      [facility] [address] [place], [place], [address] Phone: [phone]   Fax:  [phone]  Name: Kathleen [name] MRN: [id number] Date of Birth: 1941-08-04

## 2018-06-24 ENCOUNTER — Ambulatory Visit: Payer: Medicare Other

## 2018-06-24 DIAGNOSIS — M545 Low back pain: Secondary | ICD-10-CM

## 2018-06-24 DIAGNOSIS — M6281 Muscle weakness (generalized): Secondary | ICD-10-CM | POA: Diagnosis not present

## 2018-06-24 DIAGNOSIS — G8929 Other chronic pain: Secondary | ICD-10-CM

## 2018-06-24 DIAGNOSIS — M25662 Stiffness of left knee, not elsewhere classified: Secondary | ICD-10-CM

## 2018-06-24 DIAGNOSIS — M25661 Stiffness of right knee, not elsewhere classified: Secondary | ICD-10-CM

## 2018-06-24 NOTE — Therapy (Signed)
[name], [place], [address] Phone: [phone]   Fax:  [phone]  Physical Therapy Treatment/Discharge  Patient Details  Name: Kathleen [name] MRN: [id number] Date of Birth: [birth year] Referring Provider (PT): Garwin Brothers, MD   Encounter Date: 06/24/2018  PT End of Session - 06/24/18 1405    Visit Number  33    Number of Visits  33    Date for PT Re-Evaluation  06/25/18    Authorization Type  [employer] [place] at visit 15  progress visit 27    PT Start Time  0205    PT Stop Time  0300    PT Time Calculation (min)  55 min    Equipment Utilized During Treatment  Gait belt    Activity Tolerance  Patient tolerated treatment well    Behavior During Therapy  [facility] for tasks assessed/performed       History reviewed. No pertinent past medical history.  History reviewed. No pertinent surgical history.  There were no vitals filed for this visit.  Subjective Assessment - 06/24/18 1545    Subjective  Pleased  with progress but  want to go home and be independent . Moving to daughters home in November.  Pt/OT will pick up in facility mid October.     Pain Score  5     Pain Location  Ankle    Pain Orientation  Right;Left    Pain Descriptors / Indicators  Aching    Pain Type  Chronic pain    Pain Onset  More than a month ago    Pain Frequency  Constant    Aggravating Factors   walk Kathleen [name]                       [facility] Adult PT Treatment/Exercise - 06/24/18 0001      Self-Care   ADL's  Discussed with daughter limitaitons and modifications for home to provide for transition to her home or Pts home . Discussed that she may lose progress if she does not remain very active  upon discharge   She reported she would be seen in facility with PT/OT so emphasized need to have them work on ADL's like cooking and cleaning for less assistance at home. Daughter remarked maybe handicapped modified  apartment may be long term best choice to be near family.       Therapeutic Activites    Other Therapeutic Activities  Stepping up 4 inch curb and then attempted stepping up to 6 inch  step with RW and CG but RT foot needed to be 6 inc from step  other wise she was not able to  get foot up.               PT Education - 06/24/18 1547    Education Details  Safety need for all tasks and need to have someone hopefully OT in facility work on  cooking and other ADL's for return to home.     Person(s) Educated  Patient;Child(ren)    Methods  Explanation    Comprehension  Verbalized understanding       PT Short Term Goals - 05/11/18 1526      PT SHORT TERM GOAL #1   Title  She will be independent in initial HEP    Status  Achieved      PT SHORT TERM GOAL #2  Title  She will be able to transfer with board with min to no assistance    Baseline  now using walker and walking to sit in and out of bed    Status  Achieved      PT SHORT TERM GOAL #3   Title  She will be able to pull herself to stand in parallel bars    Status  Achieved      PT SHORT TERM GOAL #4   Title  She will be able to take 10 step in bars with min to mod assist.     Status  Achieved      PT SHORT TERM GOAL #5   Title  She will be able to stand for 5 min  in bars    Status  Deferred      PT SHORT TERM GOAL #6   Title  She will have no increase in leg pain    Baseline  only reported knee pain no incr in leg pain    Status  Achieved        PT Long Term Goals - 06/24/18 1551      PT LONG TERM GOAL #1   Title  She will be able to stand to walker with minor assist of one    Status  Achieved      PT LONG TERM GOAL #2   Title  She will be able to walk 50 feet with RW for home ambulation with +1 contact guard assist.     Status  Achieved      PT LONG TERM GOAL #3   Title  She will be able to stand to walker with +1 min assist from wheel chair.     Status  Achieved      PT LONG TERM GOAL #4   Title   She will be able to get herself in and out of bed with +1 min assist with assist of bed     Baseline  Need mod assist for legs to get in bed and min assist but verbal cues to get out    Status  Partially Met      PT LONG TERM GOAL #5   Title  she will report no increase in leg pain    Baseline  does not really change    Status  Achieved      PT LONG TERM GOAL #6   Title  With supervision and walker will be able to walk up incline 20 feet     Status  Achieved      PT LONG TERM GOAL #7   Title  She will be able to get herself in bed with no assist    Status  Not Met      PT LONG TERM GOAL #8   Title  She will be able to place walker on sidewalk and step up on  4 inch curb safely to access community.   not able at 6 inch   Status  Achieved            Plan - 06/24/18 1406    Clinical Impression Statement  [name] is discharged today. she has made significant gains but is left with some significant limitations.  She and daughter were encouraged to stay very active and have PT/Ot work on functional hoem tasks to assist with  return to cooking and cleaning and otther ADL's.    She will have no PT for 2-3 weeks and  they will try to walk and do other activity as able to not lose greound . This was discussed that PT is not maintenance and she needed to take all opertunities for walking and being more independnet ( IE not take easy way)   to maintain and improve function.     Clinical Impairments Affecting Rehab Potential  obesity , previous back surgery, significant LE weakness and stiffness of joints.  significnat core weakness    PT Treatment/Interventions  Moist Heat;Therapeutic activities;Patient/family education;Functional mobility training;Therapeutic exercise;Gait training;Manual techniques;Wheelchair mobility training;Passive range of motion;DME Instruction;ADLs/Self Care Home Management    PT Next Visit Plan  Discharge with HEP.     PT Home Exercise Plan  bridge , rolling ,  hip  abduction and SLR with assist    Consulted and Agree with Plan of Care  Patient    Family Member Consulted  daughter       Patient will benefit from skilled therapeutic intervention in order to improve the following deficits and impairments:  Pain, Postural dysfunction, Decreased activity tolerance, Decreased endurance, Decreased range of motion, Decreased strength, Decreased balance  Visit Diagnosis: Muscle weakness (generalized)  Chronic bilateral low back pain, with sciatica presence unspecified  Stiffness of right knee, not elsewhere classified  Stiffness of left knee, not elsewhere classified     Problem List There are no active problems to display for this patient.   [name]  PT 06/24/2018, 4:27 PM  [facility] [address] [place], [place], [address] Phone: [phone]   Fax:  [phone]  Name: Kathleen [name] MRN: [id number] Date of Birth: [birth year]  PHYSICAL THERAPY DISCHARGE SUMMARY  Visits from Start of Care: 10  Current functional level related to goals / functional outcomes: See above   Remaining deficits: See above   Education / Equipment: HEP  Plan: Patient agrees to discharge.  Patient goals were met. Patient is being discharged due to                                                     ?????   Max benefit from this episode of care.

## 2019-04-19 ENCOUNTER — Other Ambulatory Visit: Payer: Self-pay | Admitting: Family Medicine

## 2019-04-19 DIAGNOSIS — Z1231 Encounter for screening mammogram for malignant neoplasm of breast: Secondary | ICD-10-CM

## 2019-06-03 ENCOUNTER — Ambulatory Visit: Payer: Medicare Other

## 2019-06-24 ENCOUNTER — Ambulatory Visit
Admission: RE | Admit: 2019-06-24 | Discharge: 2019-06-24 | Disposition: A | Discharge status: Home / Self Care | Payer: PRIVATE HEALTH INSURANCE | Source: Ambulatory Visit | Attending: Family Medicine | Admitting: Family Medicine

## 2019-06-24 ENCOUNTER — Other Ambulatory Visit: Payer: Self-pay

## 2019-06-24 DIAGNOSIS — Z1231 Encounter for screening mammogram for malignant neoplasm of breast: Secondary | ICD-10-CM

## 2019-08-09 IMAGING — MG DIGITAL SCREENING BILATERAL MAMMOGRAM WITH TOMO AND CAD
6 series · 6 of 18 positions shown · non-contrast
Comparison: Previous exam(s).

CLINICAL DATA: Screening. Please note, the patient could not
tolerate optimal positioning; the best possible images were
obtained.

EXAM:
DIGITAL SCREENING BILATERAL MAMMOGRAM WITH TOMO AND CAD

[L CC synth-2D]
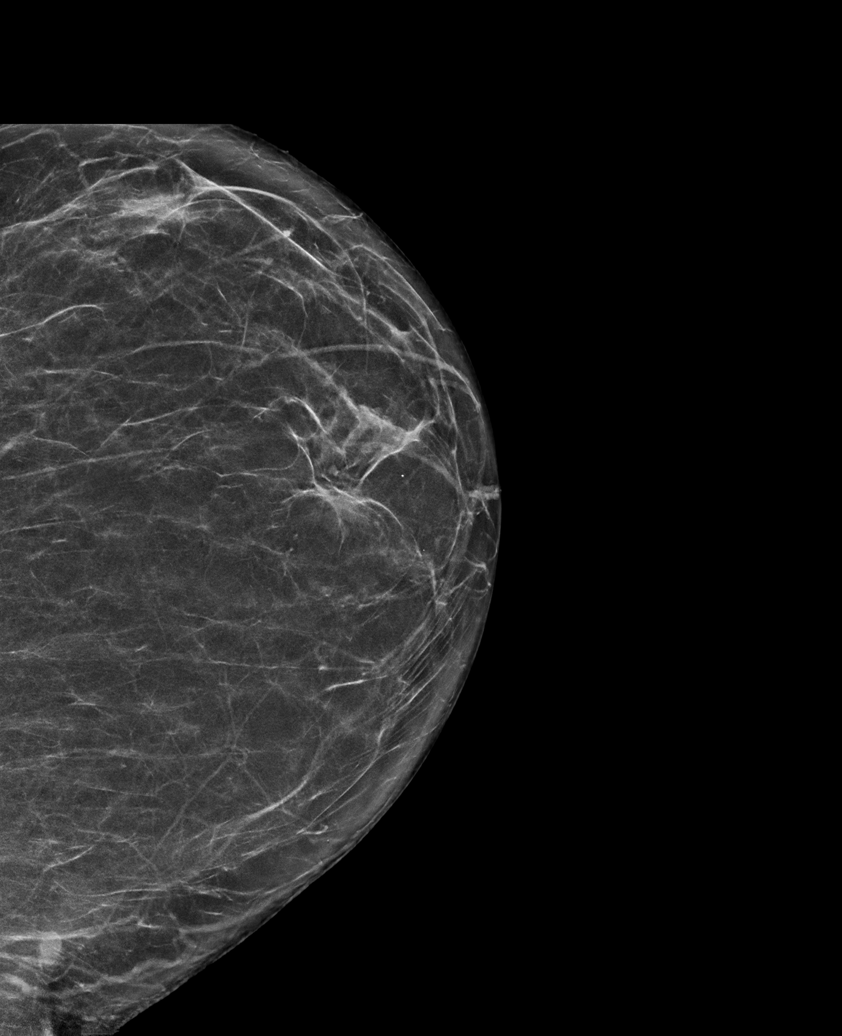

[R CC synth-2D]
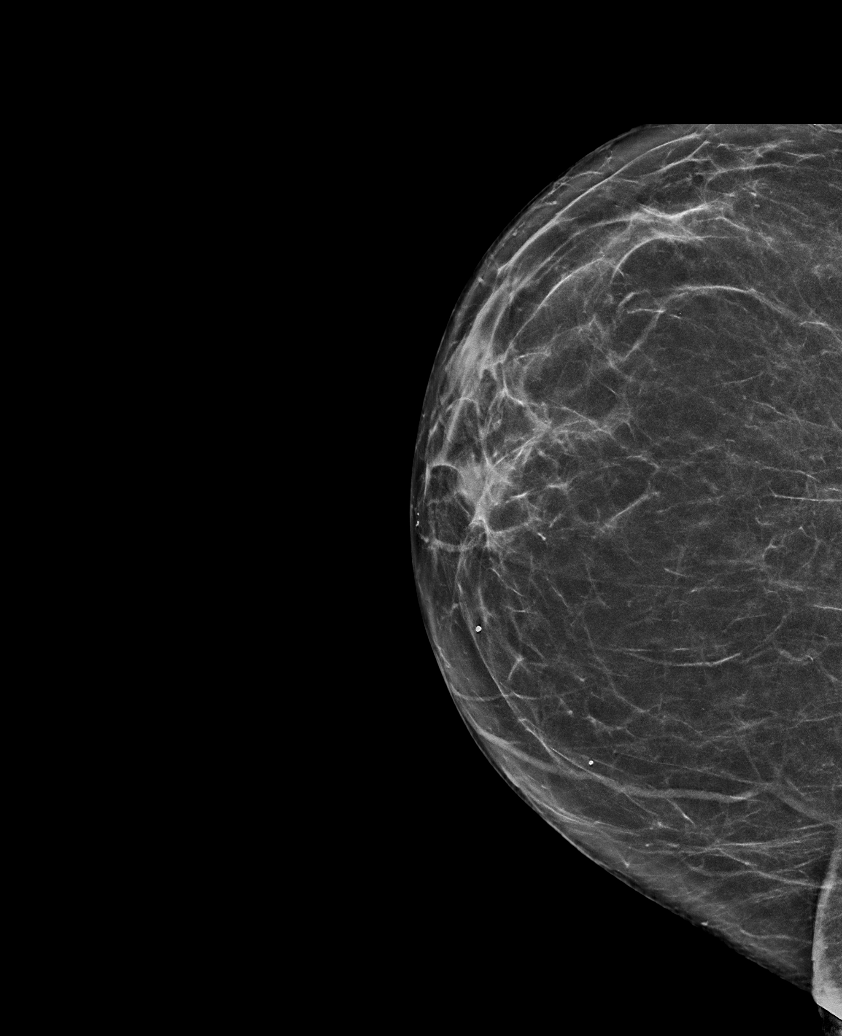

[L MLO synth-2D]
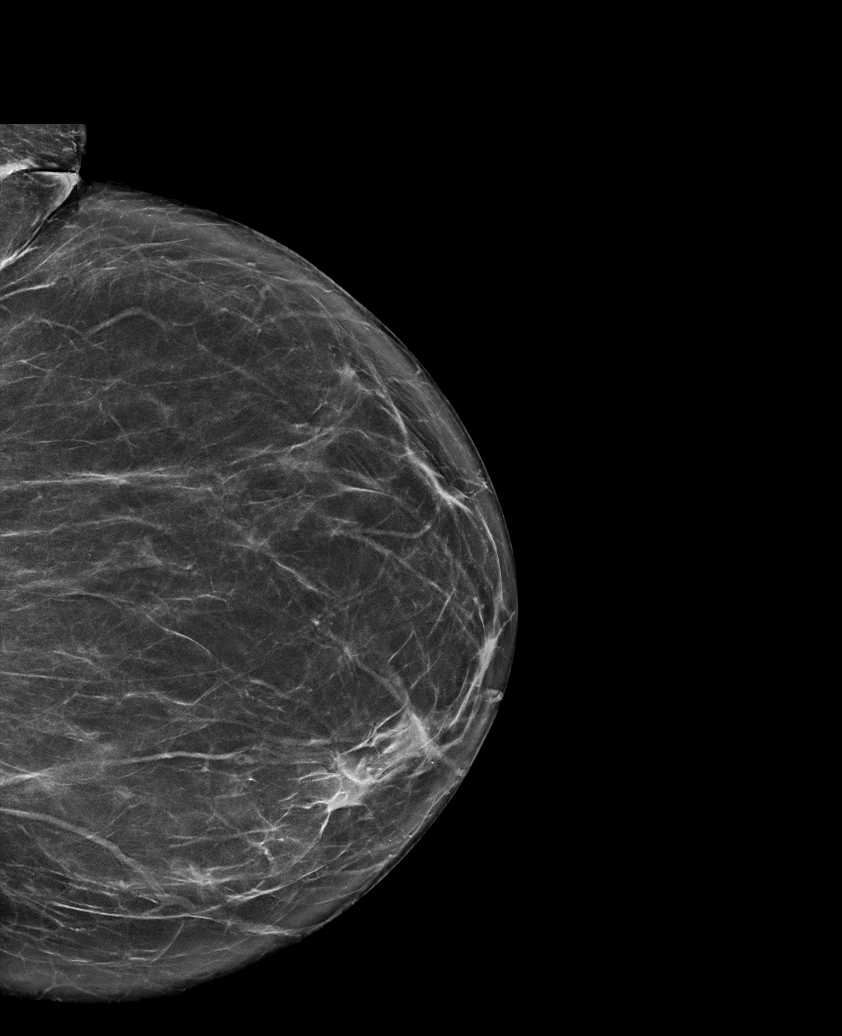

[L CC tomo · tomo slice 35/70.0]
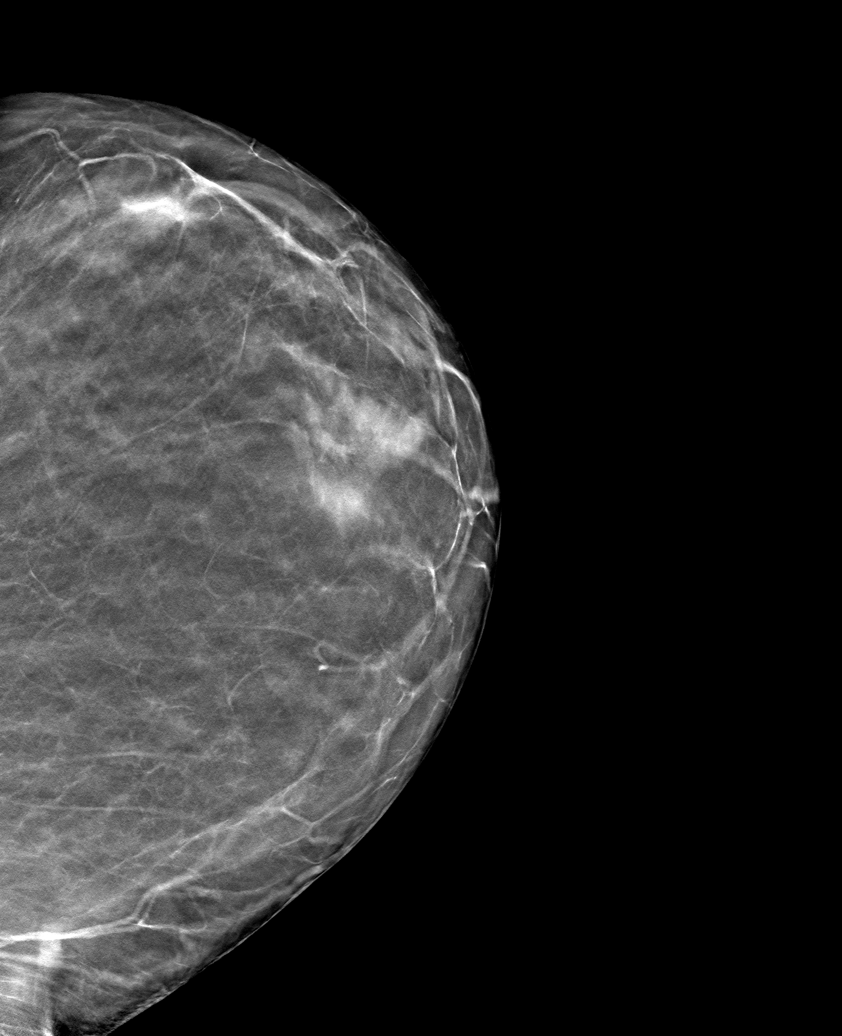

[L MLO tomo · tomo slice 37/74.0]
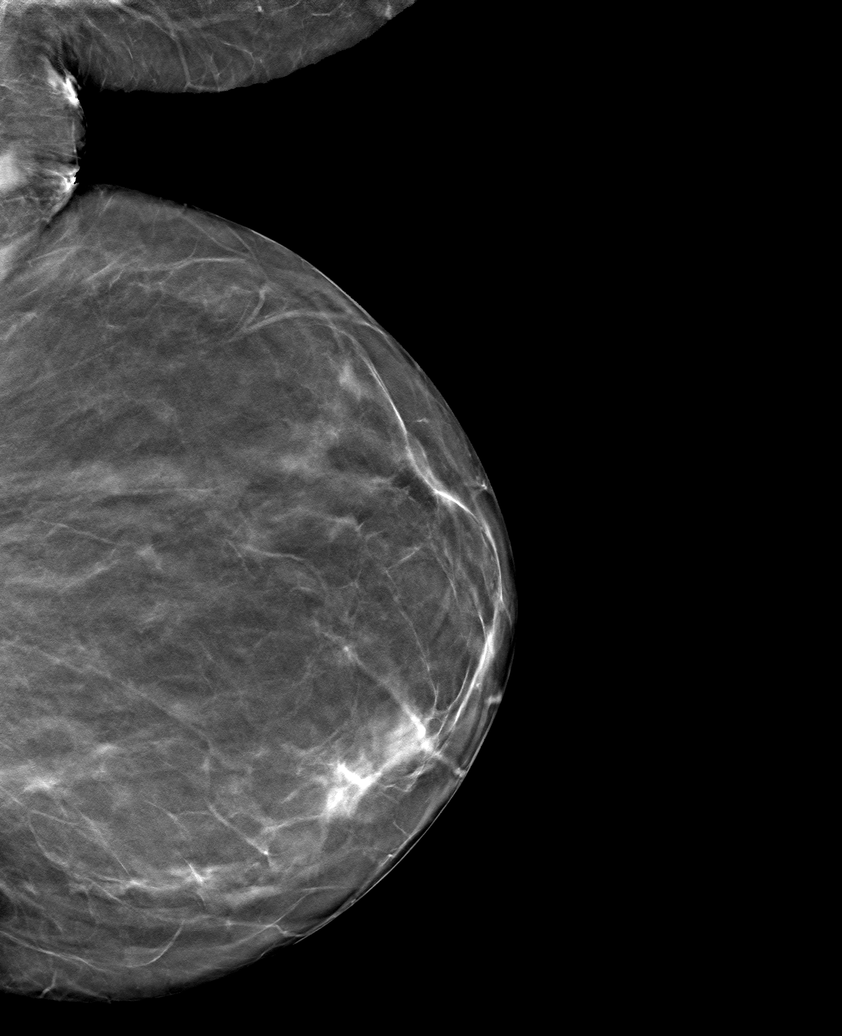

[R MLO tomo · tomo slice 41/82.0]
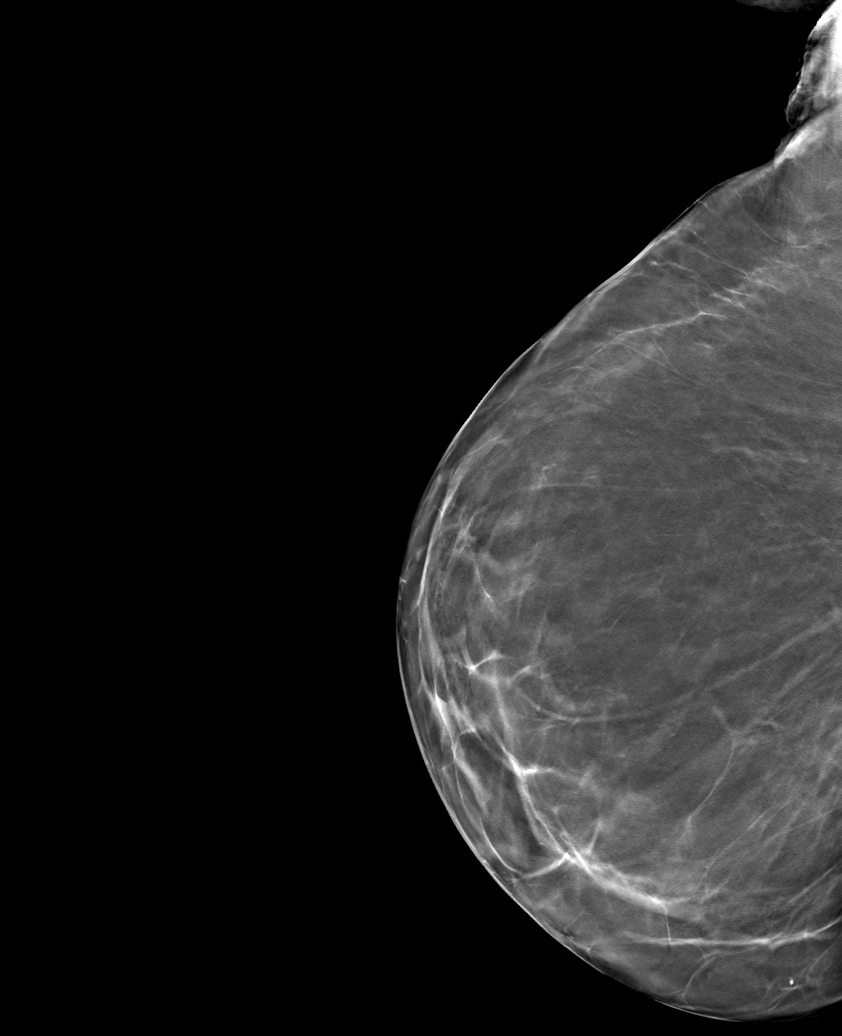

[6 of 18 positions shown; findings below may reference images not displayed]

ACR Breast Density Category b: There are scattered areas of
fibroglandular density.
FINDINGS: There are no findings suspicious for malignancy. Images were
processed with CAD.
IMPRESSION: No mammographic evidence of malignancy. A result letter of this
screening mammogram will be mailed directly to the patient.

RECOMMENDATION:
Screening mammogram in one year. (Code:MQ-H-C7L)

BI-RADS CATEGORY  1: Negative.

## 2020-02-15 ENCOUNTER — Other Ambulatory Visit: Payer: Self-pay | Admitting: Family Medicine

## 2020-02-15 DIAGNOSIS — Z1231 Encounter for screening mammogram for malignant neoplasm of breast: Secondary | ICD-10-CM

## 2020-06-25 ENCOUNTER — Ambulatory Visit
Admission: RE | Admit: 2020-06-25 | Discharge: 2020-06-25 | Disposition: A | Discharge status: Home / Self Care | Payer: Medicare (Managed Care) | Source: Ambulatory Visit | Attending: Family Medicine | Admitting: Family Medicine

## 2020-06-25 ENCOUNTER — Ambulatory Visit: Payer: PRIVATE HEALTH INSURANCE

## 2020-06-25 ENCOUNTER — Other Ambulatory Visit: Payer: Self-pay

## 2020-06-25 DIAGNOSIS — Z1231 Encounter for screening mammogram for malignant neoplasm of breast: Secondary | ICD-10-CM

## 2021-07-08 ENCOUNTER — Ambulatory Visit: Payer: Medicare (Managed Care) | Admitting: Podiatry

## 2021-07-09 ENCOUNTER — Ambulatory Visit (INDEPENDENT_AMBULATORY_CARE_PROVIDER_SITE_OTHER): Payer: Medicare (Managed Care) | Admitting: Podiatry

## 2021-07-09 ENCOUNTER — Other Ambulatory Visit: Payer: Self-pay

## 2021-07-09 ENCOUNTER — Ambulatory Visit: Payer: Medicare (Managed Care) | Admitting: Podiatry

## 2021-07-09 DIAGNOSIS — M79675 Pain in left toe(s): Secondary | ICD-10-CM

## 2021-07-09 DIAGNOSIS — B351 Tinea unguium: Secondary | ICD-10-CM

## 2021-07-09 DIAGNOSIS — M79674 Pain in right toe(s): Secondary | ICD-10-CM

## 2021-07-09 DIAGNOSIS — L84 Corns and callosities: Secondary | ICD-10-CM

## 2021-07-09 NOTE — Patient Instructions (Addendum)
Look for urea 40% cream or ointment and apply to the thickened dry skin / calluses. This can be bought over the counter, at a pharmacy or online such as [employer].   Apply this daily to the left 5th toe, right great toe, both heels    More silicone toe caps can be purchased from:  [email or web address].com/[email or web address]  Or on [employer] for the 5th toe   Reposition often while you are seated / in bed   When legs are up, put a pillow under the calves to offload the heels

## 2021-07-10 NOTE — Progress Notes (Signed)
  Subjective:  Patient ID: Kathleen [name], female    DOB: [birth year],  MRN: [id number]  Chief Complaint  Patient presents with   Nail Problem    Bilateral nail fungus    80 y.o. female presents with the above complaint. History confirmed with patient.  She is here with her daughter.  She also has callus lesions on the tip of the right great toe as well as the back of both heels.  Objective:  Physical Exam: warm, good capillary refill, no trophic changes or ulcerative lesions, normal DP and PT pulses, and normal sensory exam.  She has onychomycosis with thickened brown elongated toenails on the left side 1 through 5 with incurvated borders.  Callus on the tip of the right great toe as well as posterior heels.  No ulceration. Assessment:   1. Pain due to onychomycosis of toenails of both feet   2. Callus of foot      Plan:  Patient was evaluated and treated and all questions answered.  Discussed the etiology and treatment options for the condition in detail with the patient. Educated patient on the topical and oral treatment options for mycotic nails. Recommended debridement of the nails today. Sharp and mechanical debridement performed of all painful and mycotic nails today. Nails debrided in length and thickness using a nail nipper to level of comfort. Discussed treatment options including appropriate shoe gear. Follow up as needed for painful nails.  Also discussed using urea cream which they will get as well as offloading the heels and placing this on the left fifth toe, the right hallux callus and the posterior heel calluses. Return in about 3 months (around 10/09/2021) for painful nails and calluses.

## 2021-07-17 ENCOUNTER — Ambulatory Visit: Payer: Medicare (Managed Care) | Admitting: Podiatry

## 2021-08-20 ENCOUNTER — Other Ambulatory Visit: Payer: Self-pay | Admitting: Family Medicine

## 2021-08-20 DIAGNOSIS — Z1231 Encounter for screening mammogram for malignant neoplasm of breast: Secondary | ICD-10-CM

## 2021-10-04 ENCOUNTER — Other Ambulatory Visit: Payer: Self-pay

## 2021-10-04 ENCOUNTER — Ambulatory Visit
Admission: RE | Admit: 2021-10-04 | Discharge: 2021-10-04 | Disposition: A | Discharge status: Home / Self Care | Payer: PRIVATE HEALTH INSURANCE | Source: Ambulatory Visit | Attending: Family Medicine | Admitting: Family Medicine

## 2021-10-04 DIAGNOSIS — Z1231 Encounter for screening mammogram for malignant neoplasm of breast: Secondary | ICD-10-CM

## 2021-10-16 ENCOUNTER — Ambulatory Visit: Payer: PRIVATE HEALTH INSURANCE | Admitting: Podiatry

## 2021-11-05 ENCOUNTER — Ambulatory Visit (INDEPENDENT_AMBULATORY_CARE_PROVIDER_SITE_OTHER): Payer: Medicare PPO | Admitting: Podiatry

## 2021-11-05 ENCOUNTER — Other Ambulatory Visit: Payer: Self-pay

## 2021-11-05 ENCOUNTER — Encounter: Payer: Self-pay | Admitting: Podiatry

## 2021-11-05 DIAGNOSIS — B351 Tinea unguium: Secondary | ICD-10-CM

## 2021-11-05 DIAGNOSIS — M79674 Pain in right toe(s): Secondary | ICD-10-CM | POA: Diagnosis not present

## 2021-11-05 DIAGNOSIS — D689 Coagulation defect, unspecified: Secondary | ICD-10-CM

## 2021-11-05 DIAGNOSIS — M79675 Pain in left toe(s): Secondary | ICD-10-CM

## 2021-11-05 NOTE — Progress Notes (Signed)
This patient returns to my office for at risk foot care.  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 This patient is unable to cut nails herself since the patient cannot reach hernails.These nails are painful walking and wearing shoes. She presents to the office with her daughter.  She is concerned about swelling in both ankles without pain. This patient presents for at risk foot care today.  General Appearance  Alert, conversant and in no acute stress.  Vascular  Dorsalis pedis and posterior tibial  pulses are weakly palpable  bilaterally.  Capillary return is within normal limits  bilaterally. Temperature is within normal limits  bilaterally.  Neurologic  Senn-Weinstein monofilament wire test within normal limits  bilaterally. Muscle power within normal limits bilaterally.  Nails Thick disfigured discolored nails with subungual debris  from hallux to fifth toes bilaterally. No evidence of bacterial infection or drainage bilaterally.  Orthopedic  No limitations of motion  feet .  No crepitus or effusions noted.  No bony pathology or digital deformities noted. Swollen legs/ankle  B/L.  Skin  normotropic skin with no porokeratosis noted bilaterally.  No signs of infections or ulcers noted.     Onychomycosis  Pain in right toes  Pain in left toes  Ankle swelling  B/L.  Consent was obtained for treatment procedures.   Mechanical debridement of nails 1-5  bilaterally performed with a nail nipper.  Filed with dremel without incident. Told her to wear her compression socks as well as elevating her legs.  She was told to put her heels on a pillow when sleeping. To make an appointment with Dr.  Katina Dung.   Return office visit    3 months                 Told patient to return for periodic foot care and evaluation due to potential at risk complications.   Helane Gunther DPM

## 2022-03-03 ENCOUNTER — Ambulatory Visit: Payer: Medicare PPO | Admitting: Podiatry

## 2022-03-19 ENCOUNTER — Ambulatory Visit (INDEPENDENT_AMBULATORY_CARE_PROVIDER_SITE_OTHER): Payer: Medicare (Managed Care) | Admitting: Podiatry

## 2022-03-19 ENCOUNTER — Encounter: Payer: Self-pay | Admitting: Podiatry

## 2022-03-19 DIAGNOSIS — B351 Tinea unguium: Secondary | ICD-10-CM

## 2022-03-19 DIAGNOSIS — M79674 Pain in right toe(s): Secondary | ICD-10-CM | POA: Diagnosis not present

## 2022-03-19 DIAGNOSIS — D689 Coagulation defect, unspecified: Secondary | ICD-10-CM | POA: Diagnosis not present

## 2022-03-19 DIAGNOSIS — M79675 Pain in left toe(s): Secondary | ICD-10-CM

## 2022-03-19 NOTE — Progress Notes (Signed)
This patient returns to my office for at risk foot care.  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 This patient is unable to cut nails herself since the patient cannot reach hernails.These nails are painful walking and wearing shoes. She presents to the office with her daughter.  She is concerned about swelling in both ankles without pain. This patient presents for at risk foot care today.  General Appearance  Alert, conversant and in no acute stress.  Vascular  Dorsalis pedis and posterior tibial  pulses are weakly palpable  bilaterally.  Capillary return is within normal limits  bilaterally. Temperature is within normal limits  bilaterally.  Neurologic  Senn-Weinstein monofilament wire test within normal limits  bilaterally. Muscle power within normal limits bilaterally.  Nails Thick disfigured discolored nails with subungual debris  from hallux to fifth toes bilaterally. No evidence of bacterial infection or drainage bilaterally.  Orthopedic  No limitations of motion  feet .  No crepitus or effusions noted.  No bony pathology or digital deformities noted. Swollen legs/ankle  B/L.  Skin  normotropic skin with no porokeratosis noted bilaterally.  No signs of infections or ulcers noted.     Onychomycosis  Pain in right toes  Pain in left toes  Ankle swelling  B/L.  Consent was obtained for treatment procedures.   Mechanical debridement of nails 1-5  bilaterally performed with a nail nipper.  Filed with dremel without incident. Told her to wear her compression socks as well as elevating her legs.    Return office visit    3 months                 Told patient to return for periodic foot care and evaluation due to potential at risk complications.   Helane Gunther DPM

## 2022-06-17 ENCOUNTER — Ambulatory Visit: Payer: Medicare (Managed Care) | Admitting: Podiatry

## 2022-06-18 ENCOUNTER — Ambulatory Visit (INDEPENDENT_AMBULATORY_CARE_PROVIDER_SITE_OTHER): Payer: Medicare (Managed Care) | Admitting: Podiatry

## 2022-06-18 ENCOUNTER — Encounter: Payer: Self-pay | Admitting: Podiatry

## 2022-06-18 ENCOUNTER — Ambulatory Visit: Payer: Medicare (Managed Care) | Admitting: Podiatry

## 2022-06-18 DIAGNOSIS — D689 Coagulation defect, unspecified: Secondary | ICD-10-CM

## 2022-06-18 DIAGNOSIS — B351 Tinea unguium: Secondary | ICD-10-CM | POA: Diagnosis not present

## 2022-06-18 DIAGNOSIS — M79674 Pain in right toe(s): Secondary | ICD-10-CM

## 2022-06-18 DIAGNOSIS — M79675 Pain in left toe(s): Secondary | ICD-10-CM | POA: Diagnosis not present

## 2022-06-18 NOTE — Progress Notes (Signed)
This patient returns to my office for at risk foot care.  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 This patient is unable to cut nails herself since the patient cannot reach hernails.These nails are painful walking and wearing shoes. She presents to the office with her daughter.  She is concerned about swelling in both ankles without pain. This patient presents for at risk foot care today.  General Appearance  Alert, conversant and in no acute stress.  Vascular  Dorsalis pedis and posterior tibial  pulses are weakly palpable  bilaterally.  Capillary return is within normal limits  bilaterally. Temperature is within normal limits  bilaterally.  Neurologic  Senn-Weinstein monofilament wire test within normal limits  bilaterally. Muscle power within normal limits bilaterally.  Nails Thick disfigured discolored nails with subungual debris  from hallux to fifth toes bilaterally. No evidence of bacterial infection or drainage bilaterally.  Orthopedic  No limitations of motion  feet .  No crepitus or effusions noted.  No bony pathology or digital deformities noted. Swollen legs/ankle  B/L.  Skin  normotropic skin with no porokeratosis noted bilaterally.  No signs of infections or ulcers noted.     Onychomycosis  Pain in right toes  Pain in left toes  Ankle swelling  B/L.  Consent was obtained for treatment procedures.   Mechanical debridement of nails 1-5  bilaterally performed with a nail nipper.  Filed with dremel without incident.    Return office visit    3 months                 Told patient to return for periodic foot care and evaluation due to potential at risk complications.   Gardiner Barefoot DPM

## 2022-09-10 ENCOUNTER — Encounter: Payer: Self-pay | Admitting: Podiatry

## 2022-09-10 ENCOUNTER — Ambulatory Visit (INDEPENDENT_AMBULATORY_CARE_PROVIDER_SITE_OTHER): Payer: Medicare (Managed Care) | Admitting: Podiatry

## 2022-09-10 DIAGNOSIS — B351 Tinea unguium: Secondary | ICD-10-CM | POA: Insufficient documentation

## 2022-09-10 DIAGNOSIS — M79675 Pain in left toe(s): Secondary | ICD-10-CM | POA: Diagnosis not present

## 2022-09-10 DIAGNOSIS — D689 Coagulation defect, unspecified: Secondary | ICD-10-CM

## 2022-09-10 DIAGNOSIS — M79674 Pain in right toe(s): Secondary | ICD-10-CM

## 2022-09-10 NOTE — Progress Notes (Signed)
This patient returns to my office for at risk foot care.  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 This patient is unable to cut nails herself since the patient cannot reach hernails.These nails are painful walking and wearing shoes. She presents to the office with her son..  This patient presents for at risk foot care today.  General Appearance  Alert, conversant and in no acute stress.  Vascular  Dorsalis pedis and posterior tibial  pulses are weakly palpable  bilaterally.  Capillary return is within normal limits  bilaterally. Temperature is within normal limits  bilaterally.  Neurologic  Senn-Weinstein monofilament wire test within normal limits  bilaterally. Muscle power within normal limits bilaterally.  Nails Thick disfigured discolored nails with subungual debris  from hallux to fifth toes bilaterally. No evidence of bacterial infection or drainage bilaterally.  Orthopedic  No limitations of motion  feet .  No crepitus or effusions noted.  No bony pathology or digital deformities noted. Swollen legs/ankle  B/L.  Skin  normotropic skin with no porokeratosis noted bilaterally.  No signs of infections or ulcers noted.     Onychomycosis  Pain in right toes  Pain in left toes  Ankle swelling  B/L.  Consent was obtained for treatment procedures.   Mechanical debridement of nails 1-5  bilaterally performed with a nail nipper.  Filed with dremel without incident. Patient requests moisturizer for her feet.  Prescribe lac-hydrin.   Return office visit    3 months                 Told patient to return for periodic foot care and evaluation due to potential at risk complications.   Helane Gunther DPM

## 2022-09-11 MED ORDER — AMMONIUM LACTATE 12 % EX CREA: 1.0000 | TOPICAL_CREAM | CUTANEOUS | 0 refills | Status: AC | PRN

## 2022-09-11 NOTE — Addendum Note (Signed)
Addended byMaury Dus, Hurley Blevins L on: 09/11/2022 08:39 AM   Modules accepted: Orders

## 2022-10-01 ENCOUNTER — Other Ambulatory Visit: Payer: Self-pay | Admitting: Internal Medicine

## 2022-10-01 DIAGNOSIS — Z1231 Encounter for screening mammogram for malignant neoplasm of breast: Secondary | ICD-10-CM

## 2022-10-23 ENCOUNTER — Emergency Department (HOSPITAL_COMMUNITY)
Admission: EM | Admit: 2022-10-23 | Discharge: 2022-10-23 | Disposition: A | Discharge status: Home / Self Care | Payer: Medicare (Managed Care) | Attending: Emergency Medicine | Admitting: Emergency Medicine

## 2022-10-23 ENCOUNTER — Emergency Department (HOSPITAL_COMMUNITY): Payer: Medicare (Managed Care)

## 2022-10-23 ENCOUNTER — Other Ambulatory Visit: Payer: Self-pay

## 2022-10-23 DIAGNOSIS — M79671 Pain in right foot: Secondary | ICD-10-CM | POA: Diagnosis not present

## 2022-10-23 DIAGNOSIS — M79672 Pain in left foot: Secondary | ICD-10-CM | POA: Diagnosis present

## 2022-10-23 DIAGNOSIS — M25571 Pain in right ankle and joints of right foot: Secondary | ICD-10-CM | POA: Diagnosis not present

## 2022-10-23 DIAGNOSIS — R531 Weakness: Secondary | ICD-10-CM | POA: Insufficient documentation

## 2022-10-23 DIAGNOSIS — W19XXXA Unspecified fall, initial encounter: Secondary | ICD-10-CM

## 2022-10-23 DIAGNOSIS — M25572 Pain in left ankle and joints of left foot: Secondary | ICD-10-CM | POA: Insufficient documentation

## 2022-10-23 DIAGNOSIS — M25561 Pain in right knee: Secondary | ICD-10-CM | POA: Diagnosis not present

## 2022-10-23 DIAGNOSIS — Z7902 Long term (current) use of antithrombotics/antiplatelets: Secondary | ICD-10-CM | POA: Insufficient documentation

## 2022-10-23 NOTE — ED Notes (Signed)
Patient verbalizes understanding of discharge instructions. Opportunity for questioning and answers were provided. Armband removed by staff, pt discharged from ED. Pt taken to ED entrance via wheel chair.  

## 2022-10-23 NOTE — ED Triage Notes (Signed)
Pt BIB EMS from home after a fall at ground level. Pt c/o R leg and knee pain and L foot pain. A&Ox4, denies use of blood thinners.  EMS VS: 158/80 HR 91 94% RA

## 2022-10-23 NOTE — ED Provider Notes (Signed)
[facility] Provider Note   CSN: [id number] Arrival date & time: 10/23/22  2019     History  Chief Complaint  Patient presents with   Kathleen [name] is a 82 y.o. female.  82 year old female here after mechanical fall just prior to arrival.  Patient states that she uses a walker and a wheelchair and that she has some weakness in her legs.  States that she has this chronically and is unchanged.  Denies any back pain.  Denies any bowel or bladder dysfunction.  No recent illnesses.  States that when she fell, she did not strike her head.  Complains of pain to bilateral ankles and bilateral feet.  EMS called and patient transported here for further management       Home Medications Prior to Admission medications   Medication Sig Start Date End Date Taking? Authorizing Provider  acetaminophen (TYLENOL) 325 MG tablet Take 650 mg by mouth every 6 (six) hours as needed.    [provider]  ammonium lactate (LAC-HYDRIN) 12 % cream Apply 1 Application topically as needed for dry skin. 09/11/22   Gardiner Barefoot, DPM  Cholecalciferol 1000 units tablet Take 400 Units by mouth daily.    [provider]  clopidogrel (PLAVIX) 75 MG tablet Take 75 mg by mouth daily.    [provider]  enalapril (VASOTEC) 20 MG tablet Take 20 mg by mouth daily.    [provider]  gabapentin (NEURONTIN) 300 MG capsule Take 300 mg by mouth 3 (three) times daily.    [provider]  hydrochlorothiazide (HYDRODIURIL) 50 MG tablet Take 50 mg by mouth daily.    [provider]  loratadine (CLARITIN) 10 MG tablet Take 10 mg by mouth daily.    [provider]  metoprolol tartrate (LOPRESSOR) 50 MG tablet Take 50 mg by mouth 2 (two) times daily.    [provider]  Multiple Vitamins-Minerals (MULTIVITAMIN WITH MINERALS) tablet Take 1 tablet by mouth daily.    [provider]  oxyCODONE  (OXY IR/ROXICODONE) 5 MG immediate release tablet Take 5 mg by mouth every 4 (four) hours as needed for severe pain.    [provider]  oxycodone (ROXICODONE) 30 MG immediate release tablet Take 30 mg by mouth every 4 (four) hours as needed for pain.    [provider]  pantoprazole (PROTONIX) 40 MG tablet Take 40 mg by mouth daily.    [provider]  senna (SENOKOT) 8.6 MG TABS tablet Take 1 tablet by mouth.    [provider]  simethicone (MYLICON) 80 MG chewable tablet Chew 80 mg by mouth every 6 (six) hours as needed for flatulence.    [provider]  tiZANidine (ZANAFLEX) 2 MG tablet Take by mouth every 6 (six) hours as needed for muscle spasms.    [provider]  topiramate (TOPAMAX) 100 MG tablet Take 100 mg by mouth 2 (two) times daily.    [provider]  vitamin C (ASCORBIC ACID) 500 MG tablet Take 500 mg by mouth daily.    [provider]      Allergies    Patient has no known allergies.    Review of Systems   Review of Systems  All other systems reviewed and are negative.   Physical Exam Updated Vital Signs BP 126/61 (BP Location: Left Arm)   Pulse 86   Temp 99.3 F (37.4 C) (Oral)   Resp  19   SpO2 96%  Physical Exam Vitals and nursing note reviewed.  Constitutional:      General: She is not in acute distress.    Appearance: Normal appearance. She is well-developed. She is not toxic-appearing.  HENT:     Head: Normocephalic and atraumatic.  Eyes:     General: Lids are normal.     Conjunctiva/sclera: Conjunctivae normal.     Pupils: Pupils are equal, round, and reactive to light.  Neck:     Thyroid: No thyroid mass.     Trachea: No tracheal deviation.  Cardiovascular:     Rate and Rhythm: Normal rate and regular rhythm.     Heart sounds: Normal heart sounds. No murmur heard.    No gallop.  Pulmonary:     Effort: Pulmonary effort is normal. No respiratory distress.     Breath sounds:  Normal breath sounds. No stridor. No decreased breath sounds, wheezing, rhonchi or rales.  Abdominal:     General: There is no distension.     Palpations: Abdomen is soft.     Tenderness: There is no abdominal tenderness. There is no rebound.  Musculoskeletal:        General: No tenderness. Normal range of motion.     Cervical back: Normal range of motion and neck supple.       Legs:     Comments: No gross deformities noted to bilateral feet and ankle.  Full range of motion at both ankles.  Some pain with movement of her right knee without gross deformities.  Neurovascular status intact at both lower extremities  Skin:    General: Skin is warm and dry.     Findings: No abrasion or rash.  Neurological:     Mental Status: She is alert and oriented to person, place, and time. Mental status is at baseline.     GCS: GCS eye subscore is 4. GCS verbal subscore is 5. GCS motor subscore is 6.     Cranial Nerves: No cranial nerve deficit.     Sensory: No sensory deficit.     Motor: Motor function is intact.  Psychiatric:        Attention and Perception: Attention normal.        Speech: Speech normal.        Behavior: Behavior normal.     ED Results / Procedures / Treatments   Labs (all labs ordered are listed, but only abnormal results are displayed) Labs Reviewed - No data to display  EKG None  Radiology No results found.  Procedures Procedures    Medications Ordered in ED Medications - No data to display  ED Course/ Medical Decision Making/ A&P                             Medical Decision Making Amount and/or Complexity of Data Reviewed Radiology: ordered.   Patient's x-rays of his bilateral feet, ankles, and the are all negative.  Patient able to ambulate at her baseline.  Discussed with patient's daughter who is at bedside who is clinical taking her home.        Final Clinical Impression(s) / ED Diagnoses Final diagnoses:  None    Rx / DC Orders ED  Discharge Orders     None         Lacretia Leigh, MD 10/23/22 2248

## 2022-10-24 ENCOUNTER — Emergency Department (HOSPITAL_COMMUNITY): Payer: Medicare (Managed Care)

## 2022-10-24 ENCOUNTER — Inpatient Hospital Stay (HOSPITAL_COMMUNITY)
Admission: EM | Admit: 2022-10-24 | Discharge: 2022-11-03 | DRG: 177 | Disposition: A | Discharge status: Skilled Nursing Facility | Payer: Medicare (Managed Care) | Attending: Internal Medicine | Admitting: Internal Medicine

## 2022-10-24 ENCOUNTER — Observation Stay (HOSPITAL_COMMUNITY): Payer: Medicare (Managed Care)

## 2022-10-24 DIAGNOSIS — Z79891 Long term (current) use of opiate analgesic: Secondary | ICD-10-CM

## 2022-10-24 DIAGNOSIS — U071 COVID-19: Principal | ICD-10-CM | POA: Insufficient documentation

## 2022-10-24 DIAGNOSIS — R0789 Other chest pain: Secondary | ICD-10-CM | POA: Diagnosis present

## 2022-10-24 DIAGNOSIS — Z981 Arthrodesis status: Secondary | ICD-10-CM

## 2022-10-24 DIAGNOSIS — D696 Thrombocytopenia, unspecified: Secondary | ICD-10-CM

## 2022-10-24 DIAGNOSIS — M792 Neuralgia and neuritis, unspecified: Secondary | ICD-10-CM | POA: Diagnosis present

## 2022-10-24 DIAGNOSIS — M1990 Unspecified osteoarthritis, unspecified site: Secondary | ICD-10-CM | POA: Diagnosis present

## 2022-10-24 DIAGNOSIS — I10 Essential (primary) hypertension: Secondary | ICD-10-CM | POA: Diagnosis present

## 2022-10-24 DIAGNOSIS — Z886 Allergy status to analgesic agent status: Secondary | ICD-10-CM

## 2022-10-24 DIAGNOSIS — G9340 Encephalopathy, unspecified: Secondary | ICD-10-CM | POA: Diagnosis not present

## 2022-10-24 DIAGNOSIS — R531 Weakness: Secondary | ICD-10-CM

## 2022-10-24 DIAGNOSIS — G894 Chronic pain syndrome: Secondary | ICD-10-CM | POA: Diagnosis present

## 2022-10-24 DIAGNOSIS — Z79899 Other long term (current) drug therapy: Secondary | ICD-10-CM

## 2022-10-24 DIAGNOSIS — L03115 Cellulitis of right lower limb: Secondary | ICD-10-CM | POA: Diagnosis present

## 2022-10-24 DIAGNOSIS — G9341 Metabolic encephalopathy: Secondary | ICD-10-CM | POA: Diagnosis present

## 2022-10-24 DIAGNOSIS — S93401A Sprain of unspecified ligament of right ankle, initial encounter: Secondary | ICD-10-CM

## 2022-10-24 DIAGNOSIS — R5381 Other malaise: Secondary | ICD-10-CM | POA: Diagnosis present

## 2022-10-24 DIAGNOSIS — W19XXXA Unspecified fall, initial encounter: Secondary | ICD-10-CM | POA: Diagnosis present

## 2022-10-24 DIAGNOSIS — G629 Polyneuropathy, unspecified: Secondary | ICD-10-CM | POA: Diagnosis present

## 2022-10-24 DIAGNOSIS — Z751 Person awaiting admission to adequate facility elsewhere: Secondary | ICD-10-CM

## 2022-10-24 DIAGNOSIS — D61818 Other pancytopenia: Secondary | ICD-10-CM

## 2022-10-24 DIAGNOSIS — S96911A Strain of unspecified muscle and tendon at ankle and foot level, right foot, initial encounter: Secondary | ICD-10-CM | POA: Diagnosis present

## 2022-10-24 DIAGNOSIS — Z7902 Long term (current) use of antithrombotics/antiplatelets: Secondary | ICD-10-CM

## 2022-10-24 HISTORY — DX: Unspecified osteoarthritis, unspecified site: M19.90

## 2022-10-24 HISTORY — DX: Spinal stenosis, site unspecified: M48.00

## 2022-10-24 HISTORY — DX: Essential (primary) hypertension: I10

## 2022-10-24 HISTORY — DX: Difficulty in walking, not elsewhere classified: R26.2

## 2022-10-24 LAB — COMPREHENSIVE METABOLIC PANEL
ALT: 18 U/L (ref 0–44)
AST: 34 U/L (ref 15–41)
Albumin: 2.8 g/dL — ABNORMAL LOW (ref 3.5–5.0)
Alkaline Phosphatase: 53 U/L (ref 38–126)
Anion gap: 8 (ref 5–15)
BUN: 15 mg/dL (ref 8–23)
CO2: 28 mmol/L (ref 22–32)
Calcium: 8.6 mg/dL — ABNORMAL LOW (ref 8.9–10.3)
Chloride: 99 mmol/L (ref 98–111)
Creatinine, Ser: 0.71 mg/dL (ref 0.44–1.00)
GFR, Estimated: >60 mL/min (ref >60)
Glucose, Bld: 108 mg/dL — ABNORMAL HIGH (ref 70–99)
Potassium: 3.8 mmol/L (ref 3.5–5.1)
Sodium: 135 mmol/L (ref 135–145)
Total Bilirubin: 0.7 mg/dL (ref 0.3–1.2)
Total Protein: 6.9 g/dL (ref 6.5–8.1)

## 2022-10-24 LAB — CBC
HCT: 33.2 % — ABNORMAL LOW (ref 36.0–46.0)
Hemoglobin: 10.9 g/dL — ABNORMAL LOW (ref 12.0–15.0)
MCH: 29.6 pg (ref 26.0–34.0)
MCHC: 32.8 g/dL (ref 30.0–36.0)
MCV: 90.2 fL (ref 80.0–100.0)
Platelets: 131 10*3/uL — ABNORMAL LOW (ref 150–400)
RBC: 3.68 MIL/uL — ABNORMAL LOW (ref 3.87–5.11)
RDW: 14.7 % (ref 11.5–15.5)
WBC: 3.6 10*3/uL — ABNORMAL LOW (ref 4.0–10.5)
nRBC: 0 % (ref 0.0–0.2)

## 2022-10-24 LAB — URINALYSIS, ROUTINE W REFLEX MICROSCOPIC
Bilirubin Urine: NEGATIVE
Glucose, UA: NEGATIVE mg/dL
Hgb urine dipstick: NEGATIVE
Ketones, ur: NEGATIVE mg/dL
Leukocytes,Ua: NEGATIVE
Nitrite: NEGATIVE
Protein, ur: NEGATIVE mg/dL
Specific Gravity, Urine: 1.01 (ref 1.005–1.030)
pH: 6 (ref 5.0–8.0)

## 2022-10-24 LAB — RESP PANEL BY RT-PCR (RSV, FLU A&B, COVID)  RVPGX2
Influenza A by PCR: NEGATIVE
Influenza B by PCR: NEGATIVE
Resp Syncytial Virus by PCR: NEGATIVE
SARS Coronavirus 2 by RT PCR: POSITIVE — AB

## 2022-10-24 MED ORDER — ZINC SULFATE 220 (50 ZN) MG PO CAPS
220.0000 mg | ORAL_CAPSULE | Freq: Every day | ORAL | Status: DC
  Administered 2022-10-25 – 2022-11-03 (×10): 220 mg via ORAL
  Filled 2022-10-24 (×10): qty 1

## 2022-10-24 MED ORDER — CEPHALEXIN 250 MG PO CAPS
1000.0000 mg | ORAL_CAPSULE | Freq: Once | ORAL | Status: AC
  Administered 2022-10-24: 1000 mg via ORAL
  Filled 2022-10-24: qty 4

## 2022-10-24 MED ORDER — ALBUTEROL SULFATE HFA 108 (90 BASE) MCG/ACT IN AERS: 2.0000 | INHALATION_SPRAY | Freq: Four times a day (QID) | RESPIRATORY_TRACT | Status: DC | PRN

## 2022-10-24 MED ORDER — CEPHALEXIN 500 MG PO CAPS: 500.0000 mg | ORAL_CAPSULE | Freq: Four times a day (QID) | ORAL | 0 refills | Status: DC

## 2022-10-24 MED ORDER — VITAMIN C 500 MG PO TABS
500.0000 mg | ORAL_TABLET | Freq: Every day | ORAL | Status: DC
  Administered 2022-10-25 – 2022-11-03 (×10): 500 mg via ORAL
  Filled 2022-10-24 (×10): qty 1

## 2022-10-24 NOTE — ED Provider Notes (Signed)
[facility] Provider Note   CSN: [id number] Arrival date & time: 10/24/22  1147     History  Chief Complaint  Patient presents with   Weakness    Kathleen [name] is a 82 y.o. female.  Patient presents via EMS for evaluation of generalized weakness. Was in ED yesterday for eval foot/ankle pain after mechanical fall - was told xrays negative. Since then EMS with general weakness.  Pt limited historian - level 5  caveat. Pt indicates at baseline uses wheelchair and walker for mobility/transfers. Denies hitting head or loc w fall. Is on plavix. No severe headache. No neck/back pain. No chest pain or sob. No abd pain or nvd. No dysuria or gu c/o. No recent change in meds. No cough or uri symptoms. No fever or chills. Pt denies new neuro c/o, no change in speech or vision, no new numbness/weakness or change in her baseline functional ability.   The history is provided by the patient, the EMS personnel and medical records.  Weakness Associated symptoms: no abdominal pain, no chest pain, no cough, no diarrhea, no dizziness, no dysuria, no fever, no headaches, no shortness of breath and no vomiting        Home Medications Prior to Admission medications   Medication Sig Start Date End Date Taking? Authorizing Provider  cephALEXin (KEFLEX) 500 MG capsule Take 1 capsule (500 mg total) by mouth 4 (four) times daily. 10/24/22  Yes Lajean Saver, MD  acetaminophen (TYLENOL) 325 MG tablet Take 650 mg by mouth every 6 (six) hours as needed.    [provider]  ammonium lactate (LAC-HYDRIN) 12 % cream Apply 1 Application topically as needed for dry skin. 09/11/22   Gardiner Barefoot, DPM  Cholecalciferol 1000 units tablet Take 400 Units by mouth daily.    [provider]  clopidogrel (PLAVIX) 75 MG tablet Take 75 mg by mouth daily.    [provider]  enalapril (VASOTEC) 20 MG tablet Take 20 mg by mouth daily.    [provider]  gabapentin (NEURONTIN) 300 MG capsule Take 300 mg by mouth 3 (three) times daily.    [provider]  hydrochlorothiazide (HYDRODIURIL) 50 MG tablet Take 50 mg by mouth daily.    [provider]  loratadine (CLARITIN) 10 MG tablet Take 10 mg by mouth daily.    [provider]  metoprolol tartrate (LOPRESSOR) 50 MG tablet Take 50 mg by mouth 2 (two) times daily.    [provider]  Multiple Vitamins-Minerals (MULTIVITAMIN WITH MINERALS) tablet Take 1 tablet by mouth daily.    [provider]  oxyCODONE (OXY IR/ROXICODONE) 5 MG immediate release tablet Take 5 mg by mouth every 4 (four) hours as needed for severe pain.    [provider]  oxycodone (ROXICODONE) 30 MG immediate release tablet Take 30 mg by mouth every 4 (four) hours as needed for pain.    [provider]  pantoprazole (PROTONIX) 40 MG tablet Take 40 mg by mouth daily.    [provider]  senna (SENOKOT) 8.6 MG TABS tablet Take 1 tablet by mouth.    [provider]  simethicone (MYLICON) 80 MG chewable tablet Chew 80 mg by mouth every 6 (six) hours as needed for flatulence.    [provider]  tiZANidine (ZANAFLEX) 2 MG tablet Take by mouth every 6 (six) hours as needed for muscle spasms.    [provider]  topiramate (TOPAMAX) 100 MG  tablet Take 100 mg by mouth 2 (two) times daily.    [provider]  vitamin C (ASCORBIC ACID) 500 MG tablet Take 500 mg by mouth daily.    [provider]      Allergies    Aspirin    Review of Systems   Review of Systems  Constitutional:  Negative for fever.  HENT:  Negative for sore throat.   Eyes:  Negative for visual disturbance.  Respiratory:  Negative for cough and shortness of breath.   Cardiovascular:  Negative for chest pain, palpitations and leg swelling.  Gastrointestinal:  Negative for abdominal pain, blood in stool, diarrhea, rectal pain and vomiting.   Genitourinary:  Negative for dysuria and flank pain.  Musculoskeletal:  Negative for back pain and neck pain.  Skin:  Negative for rash.  Neurological:  Positive for weakness. Negative for dizziness, speech difficulty, numbness and headaches.    Physical Exam Updated Vital Signs BP 104/86   Pulse 79   Temp 99.6 F (37.6 C) (Oral)   Resp (!) 23   SpO2 98%  Physical Exam Vitals and nursing note reviewed.  Constitutional:      Appearance: Normal appearance. She is well-developed.  HENT:     Head: Atraumatic.     Nose: Nose normal.     Mouth/Throat:     Mouth: Mucous membranes are moist.  Eyes:     General: No scleral icterus.    Extraocular Movements: Extraocular movements intact.     Conjunctiva/sclera: Conjunctivae normal.     Pupils: Pupils are equal, round, and reactive to light.  Neck:     Vascular: No carotid bruit.     Trachea: No tracheal deviation.  Cardiovascular:     Rate and Rhythm: Normal rate and regular rhythm.     Pulses: Normal pulses.     Heart sounds: Normal heart sounds. No murmur heard.    No friction rub. No gallop.  Pulmonary:     Effort: Pulmonary effort is normal. No respiratory distress.     Breath sounds: Normal breath sounds.  Chest:     Chest wall: No tenderness.  Abdominal:     General: Bowel sounds are normal. There is no distension.     Palpations: Abdomen is soft. There is no mass.     Tenderness: There is no abdominal tenderness. There is no guarding.  Genitourinary:    Comments: No cva tenderness.  Musculoskeletal:     Cervical back: Normal range of motion and neck supple. No rigidity or tenderness. No muscular tenderness.     Comments: Chronic edema to bilateral legs, symmetric. Legs and feet overall are of normal color and warmth, distal pulses palp. Pt with area/patch of skin RLE medially above knee of mildly increased warmth and erythema, mildly tender, no crepitus - felt c/w mild/early cellulitis. Good passive rom at bil hip,  knees and ankles. Tenderness left ankle. Ankle grossly stable. Distal pulse palp. CTLS spine, non tender, aligned, no step off.   Skin:    General: Skin is warm and dry.     Findings: No rash.  Neurological:     Mental Status: She is alert.     Comments: Alert, speech normal. Motor/sens grossly intact bil. Stre 5/5.   Psychiatric:        Mood and Affect: Mood normal.     ED Results / Procedures / Treatments   Labs (all labs ordered are listed, but only abnormal results are displayed) Results for orders placed  or performed during the hospital encounter of 10/24/22  CBC  Result Value Ref Range   WBC 3.6 (L) 4.0 - 10.5 K/uL   RBC 3.68 (L) 3.87 - 5.11 MIL/uL   Hemoglobin 10.9 (L) 12.0 - 15.0 g/dL   HCT 33.2 (L) 36.0 - 46.0 %   MCV 90.2 80.0 - 100.0 fL   MCH 29.6 26.0 - 34.0 pg   MCHC 32.8 30.0 - 36.0 g/dL   RDW 14.7 11.5 - 15.5 %   Platelets 131 (L) 150 - 400 K/uL   nRBC 0.0 0.0 - 0.2 %  Comprehensive metabolic panel  Result Value Ref Range   Sodium 135 135 - 145 mmol/L   Potassium 3.8 3.5 - 5.1 mmol/L   Chloride 99 98 - 111 mmol/L   CO2 28 22 - 32 mmol/L   Glucose, Bld 108 (H) 70 - 99 mg/dL   BUN 15 8 - 23 mg/dL   Creatinine, Ser 0.71 0.44 - 1.00 mg/dL   Calcium 8.6 (L) 8.9 - 10.3 mg/dL   Total Protein 6.9 6.5 - 8.1 g/dL   Albumin 2.8 (L) 3.5 - 5.0 g/dL   AST 34 15 - 41 U/L   ALT 18 0 - 44 U/L   Alkaline Phosphatase 53 38 - 126 U/L   Total Bilirubin 0.7 0.3 - 1.2 mg/dL   GFR, Estimated >60 >60 mL/min   Anion gap 8 5 - 15  Urinalysis, Routine w reflex microscopic -Urine, Catheterized  Result Value Ref Range   Color, Urine YELLOW YELLOW   APPearance CLEAR CLEAR   Specific Gravity, Urine 1.010 1.005 - 1.030   pH 6.0 5.0 - 8.0   Glucose, UA NEGATIVE NEGATIVE mg/dL   Hgb urine dipstick NEGATIVE NEGATIVE   Bilirubin Urine NEGATIVE NEGATIVE   Ketones, ur NEGATIVE NEGATIVE mg/dL   Protein, ur NEGATIVE NEGATIVE mg/dL   Nitrite NEGATIVE NEGATIVE   Leukocytes,Ua NEGATIVE  NEGATIVE   DG Knee Complete 4 Views Right  Result Date: 10/23/2022 CLINICAL DATA:  Fall, right knee pain EXAM: RIGHT KNEE - COMPLETE 4+ VIEW COMPARISON:  None Available. 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 No acute fracture or dislocation. No effusion. Mild diffuse subcutaneous edema. IMPRESSION: 1. Severe tricompartmental degenerative arthritis. Electronically Signed   By: Fidela Salisbury M.D.   On: 10/23/2022 22:22   DG Ankle 2 Views Left  Result Date: 10/23/2022 CLINICAL DATA:  Fall, left ankle pain EXAM: LEFT ANKLE - 2 VIEW COMPARISON:  None FINDINGS: Normal alignment. No acute fracture or dislocation. Mild tibiotalar degenerative arthritis. No ankle effusion. Diffuse subcutaneous edema with bimalleolar soft tissue swelling IMPRESSION: 1. Soft tissue swelling. No acute fracture or dislocation. Electronically Signed   By: Fidela Salisbury M.D.   On: 10/23/2022 22:22   DG Ankle 2 Views Right  Result Date: 10/23/2022 CLINICAL DATA:  Fall, right ankle pain EXAM: RIGHT ANKLE - 2 VIEW COMPARISON:  None Available. FINDINGS: Normal alignment. No acute fracture or dislocation. Moderate tibiotalar degenerative arthritis. No effusion. Diffuse subcutaneous edema with bimalleolar soft tissue swelling. IMPRESSION: 1. Soft tissue swelling. No acute fracture or dislocation. Electronically Signed   By: Fidela Salisbury M.D.   On: 10/23/2022 22:21   DG Foot 2 Views Right  Result Date: 10/23/2022 CLINICAL DATA:  Trauma EXAM: RIGHT FOOT - 2 VIEW COMPARISON:  None Available. FINDINGS: The bones are osteopenic. There is no evidence of fracture or dislocation. There is no evidence of arthropathy or other focal bone abnormality. Soft tissues are unremarkable. IMPRESSION:  1. No acute fracture or dislocation. 2. Osteopenia. Electronically Signed   By: Darliss Cheney M.D.   On: 10/23/2022 22:20   DG Foot 2 Views Left  Result Date: 10/23/2022 CLINICAL DATA:  Fall, left foot pain  EXAM: LEFT FOOT - 2 VIEW COMPARISON:  None Available. 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 Otherwise normal alignment. No acute fracture or dislocation. Joint spaces are preserved. Soft tissues are unremarkable. IMPRESSION: 1. 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 Electronically Signed   By: Helyn Numbers M.D.   On: 10/23/2022 22:20    EKG EKG Interpretation  Date/Time:  Friday October 24 2022 13:05:12 EST Ventricular Rate:  75 PR Interval:  134 QRS Duration: 82 QT Interval:  406 QTC Calculation: 453 R Axis:   46 Text Interpretation: Normal sinus rhythm Normal ECG No previous ECGs available Confirmed by Cathren Laine ([id number]) on 10/24/2022 2:34:40 PM  Radiology DG Knee Complete 4 Views Right  Result Date: 10/23/2022 CLINICAL DATA:  Fall, right knee pain EXAM: RIGHT KNEE - COMPLETE 4+ VIEW COMPARISON:  None Available. 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 No acute fracture or dislocation. No effusion. Mild diffuse subcutaneous edema. IMPRESSION: 1. Severe tricompartmental degenerative arthritis. Electronically Signed   By: Helyn Numbers M.D.   On: 10/23/2022 22:22   DG Ankle 2 Views Left  Result Date: 10/23/2022 CLINICAL DATA:  Fall, left ankle pain EXAM: LEFT ANKLE - 2 VIEW COMPARISON:  None FINDINGS: Normal alignment. No acute fracture or dislocation. Mild tibiotalar degenerative arthritis. No ankle effusion. Diffuse subcutaneous edema with bimalleolar soft tissue swelling IMPRESSION: 1. Soft tissue swelling. No acute fracture or dislocation. Electronically Signed   By: Helyn Numbers M.D.   On: 10/23/2022 22:22   DG Ankle 2 Views Right  Result Date: 10/23/2022 CLINICAL DATA:  Fall, right ankle pain EXAM: RIGHT ANKLE - 2 VIEW COMPARISON:  None Available. FINDINGS: Normal alignment. No acute fracture or dislocation. Moderate tibiotalar degenerative arthritis. No effusion. Diffuse subcutaneous edema with bimalleolar soft  tissue swelling. IMPRESSION: 1. Soft tissue swelling. No acute fracture or dislocation. Electronically Signed   By: Helyn Numbers M.D.   On: 10/23/2022 22:21   DG Foot 2 Views Right  Result Date: 10/23/2022 CLINICAL DATA:  Trauma EXAM: RIGHT FOOT - 2 VIEW COMPARISON:  None Available. FINDINGS: The bones are osteopenic. There is no evidence of fracture or dislocation. There is no evidence of arthropathy or other focal bone abnormality. Soft tissues are unremarkable. IMPRESSION: 1. No acute fracture or dislocation. 2. Osteopenia. Electronically Signed   By: Darliss Cheney M.D.   On: 10/23/2022 22:20   DG Foot 2 Views Left  Result Date: 10/23/2022 CLINICAL DATA:  Fall, left foot pain EXAM: LEFT FOOT - 2 VIEW COMPARISON:  None Available. 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 Otherwise normal alignment. No acute fracture or dislocation. Joint spaces are preserved. Soft tissues are unremarkable. IMPRESSION: 1. 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 Electronically Signed   By: Helyn Numbers M.D.   On: 10/23/2022 22:20    Procedures Procedures    Medications Ordered in ED Medications  cephALEXin (KEFLEX) capsule 1,000 mg (has no administration in time range)    ED Course/ Medical Decision Making/ A&P                             Medical Decision Making Problems Addressed: Cellulitis of leg, right: acute illness or injury  Generalized weakness: acute illness or injury with systemic symptoms that poses a threat to life or bodily functions Sprain and strain of right ankle: acute illness or injury Thrombocytopenia (HCC): acute illness or injury  Amount and/or Complexity of Data Reviewed Independent Historian: EMS    Details: hx External Data Reviewed: radiology and notes. Labs: ordered. Decision-making details documented in ED Course. Radiology: ordered and independent interpretation performed. Decision-making details documented in ED Course. ECG/medicine tests:  ordered and independent interpretation performed. Decision-making details documented in ED Course.  Risk Prescription drug management. Decision regarding hospitalization.   Iv ns. Continuous pulse ox and cardiac monitoring. Labs ordered/sent. Imaging ordered.   Differential diagnosis includes head injury (fall on thinners), lower extremity pain/sprain, anemia, dehydration, cellulits, viral syndrome, etc . Dispo decision including potential need for admission considered - will get labs and imaging and reassess.   Reviewed nursing notes and prior charts for additional history. External reports reviewed. Additional history from: EMS.   Cardiac monitor: sinus rhythm, rate 80.  Labs reviewed/interpreted by me - hgb 11. Wbc not elevated. Chem normal.   Recent xrays reviewed/interpreted by me - no fx  CT reviewed/interpreted by me - no hem.  Po fluids/food.   Recheck vitals/temp.   1615, ct/labs pending, signed out to Dr Jarold Motto to f/u on pending results - if neg, feel likely safe for d/c with close pcp f/u.             Final Clinical Impression(s) / ED Diagnoses Final diagnoses:  Generalized weakness  Cellulitis of leg, right  Sprain and strain of right ankle  Thrombocytopenia (HCC)    Rx / DC Orders ED Discharge Orders          Ordered    cephALEXin (KEFLEX) 500 MG capsule  4 times daily        10/24/22 1615              Cathren Laine, MD 10/24/22 1617

## 2022-10-24 NOTE — H&P (Signed)
History and Physical    PatientMarland Kitchen AMILLIA [name] KZS:[id number] DOB: 1941/05/07 DOA: 10/24/2022 DOS: the patient was seen and examined on 10/25/2022 PCP: Eloisa Northern, MD  Patient coming from: Home - lives with her daughter. Uses walker and wheel chair    Chief Complaint: weakness and confusion   HPI: Kathleen [name] is a 82 y.o. 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 She states she fell on 1/25 due to weakness in her legs and just went down. Came to ED after this fall and was discharged. She went back home and they had some problems getting her in and out of car and trouble with her ADLs. On Friday she had a fever and was a little confused. Fever around 101. She was still weak and this prompted them to call 911.     She has had fever,  she has some body ache and a dry throat, vision changes, +headaches, + chest pain (not now, but has had some intermittent chest pressure and palpitation),  shortness of breath or cough, abdominal pain, N/V/D, dysuria or leg swelling.    She does not smoke or drink alcohol.   ER Course:  vitals: temp: 99.6, bp: 120/64, HR; 80, RR: 18, oxygen: 94%RA Pertinent labs: wbc: 3.6, hgb: 10.9, platelets: 131, COVID positive CT  head: no acute finding, enlarged partially empty sella. CXR: clear, no acute finding  In ED: given keflex and [facility] asked to admit.    Review of Systems: As mentioned in the history of present illness. All other systems reviewed and are negative. No past medical history on file. No past surgical history on file. Social History:  has no history on file for tobacco use, alcohol use, and drug use.  Allergies  Allergen Reactions   Aspirin Nausea And Vomiting    Family History  Problem Relation Age of Onset   Breast cancer Neg Hx     Prior to Admission medications   Medication Sig  Start Date End Date Taking? Authorizing Provider  cephALEXin (KEFLEX) 500 MG capsule Take 1 capsule (500 mg total) by mouth 4 (four) times daily. 10/24/22  Yes Cathren Laine, MD  acetaminophen (TYLENOL) 325 MG tablet Take 650 mg by mouth every 6 (six) hours as needed.    [provider]  ammonium lactate (LAC-HYDRIN) 12 % cream Apply 1 Application topically as needed for dry skin. 09/11/22   Helane Gunther, DPM  Cholecalciferol 1000 units tablet Take 400 Units by mouth daily.    [provider]  clopidogrel (PLAVIX) 75 MG tablet Take 75 mg by mouth daily.    [provider]  enalapril (VASOTEC) 20 MG tablet Take 20 mg by mouth daily.    [provider]  gabapentin (NEURONTIN) 300 MG capsule Take 300 mg by mouth 3 (three) times daily.    [provider]  hydrochlorothiazide (HYDRODIURIL) 50 MG tablet Take 50 mg by mouth daily.    [provider]  loratadine (CLARITIN) 10 MG tablet Take 10 mg by mouth daily.    [provider]  metoprolol tartrate (LOPRESSOR) 50 MG tablet Take 50 mg by mouth 2 (two) times daily.    [provider]  Multiple Vitamins-Minerals (MULTIVITAMIN WITH MINERALS) tablet Take 1 tablet by mouth daily.    [provider]  oxyCODONE (OXY IR/ROXICODONE) 5 MG immediate release tablet Take 5 mg by  mouth every 4 (four) hours as needed for severe pain.    [provider]  oxycodone (ROXICODONE) 30 MG immediate release tablet Take 30 mg by mouth every 4 (four) hours as needed for pain.    [provider]  pantoprazole (PROTONIX) 40 MG tablet Take 40 mg by mouth daily.    [provider]  senna (SENOKOT) 8.6 MG TABS tablet Take 1 tablet by mouth.    [provider]  simethicone (MYLICON) 80 MG chewable tablet Chew 80 mg by mouth every 6 (six) hours as needed for flatulence.    [provider]  tiZANidine (ZANAFLEX) 2 MG tablet Take by mouth every 6 (six) hours as  needed for muscle spasms.    [provider]  topiramate (TOPAMAX) 100 MG tablet Take 100 mg by mouth 2 (two) times daily.    [provider]  vitamin C (ASCORBIC ACID) 500 MG tablet Take 500 mg by mouth daily.    [provider]    Physical Exam: Vitals:   10/24/22 1930 10/24/22 2100 10/24/22 2150 10/24/22 2200  BP: 137/76 139/81  134/89  Pulse: 73 83  77  Resp: 20 20  17   Temp:   98.9 F (37.2 C)   TempSrc:   Oral   SpO2: 93% 95%  91%   General:  Appears calm and comfortable and is in NAD Eyes:  PERRL, EOMI, normal lids, iris ENT:  grossly normal hearing, lips & tongue, mmm; appropriate dentition Neck:  no LAD, masses or thyromegaly; no carotid bruits Cardiovascular:  RRR, no m/r/g. No LE edema. +lymphedema  Respiratory:   CTA bilaterally with no wheezes/rales/rhonchi.  Normal respiratory effort. Abdomen:  soft, NT, ND, NABS Back:   normal alignment, no CVAT Skin:  no rash or induration seen on limited exam. Bilateral mild erythema to medial LE.  Musculoskeletal:  grossly normal tone BUE/BLE, good ROM, no bony abnormality Lower extremity:  No LE edema.  Limited foot exam with no ulcerations.  2+ distal pulses. Psychiatric:  grossly normal mood and affect, speech fluent and appropriate, AOx2 Neurologic:  CN 2-12 grossly intact, moves all extremities in coordinated fashion, sensation intact   Radiological Exams on Admission: Independently reviewed - see discussion in A/P where applicable  DG CHEST PORT 1 VIEW  Result Date: 10/25/2022 CLINICAL DATA:  Increased weakness and COVID-19 positivity, initial encounter EXAM: PORTABLE CHEST 1 VIEW COMPARISON:  None Available. FINDINGS: Cardiac shadow is enlarged. Postsurgical changes are noted in the lower thoracic spine. Lungs are clear. No focal abnormality is noted. IMPRESSION: No acute abnormality noted. Electronically Signed   By: 10/27/2022 M.D.   On: 10/25/2022 00:12   CT Head Wo Contrast  Result  Date: 10/24/2022 CLINICAL DATA:  Mental status change, unknown cause EXAM: CT HEAD WITHOUT CONTRAST TECHNIQUE: Contiguous axial images were obtained from the base of the skull through the vertex without intravenous contrast. RADIATION DOSE REDUCTION: This exam was performed according to the departmental dose-optimization program which includes automated exposure control, adjustment of the mA and/or kV according to patient size and/or use of iterative reconstruction technique. COMPARISON:  None Available. FINDINGS: Brain: No intracranial hemorrhage, mass effect, or midline shift. Normal for age atrophy with mild periventricular and deep white matter hypodensity typical of chronic small vessel ischemia. No hydrocephalus. The basilar cisterns are patent. No evidence of territorial infarct or acute ischemia. Enlarged partially empty sella. No extra-axial or intracranial fluid collection. Vascular: Atherosclerosis of skullbase vasculature without hyperdense vessel or abnormal  calcification. Skull: No acute findings. Frontal hyperostosis. Sinuses/Orbits: No acute findings. Bilateral lens resection. Other: None. IMPRESSION: 1. No acute intracranial abnormality. 2. Mild chronic small vessel ischemia. 3. Enlarged partially empty sella, typically incidental but can be seen in the setting of intracranial hypertension. Electronically Signed   By: Keith Rake M.D.   On: 10/24/2022 19:47   DG Knee Complete 4 Views Right  Result Date: 10/23/2022 CLINICAL DATA:  Fall, right knee pain EXAM: RIGHT KNEE - COMPLETE 4+ VIEW COMPARISON:  None Available. 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 No acute fracture or dislocation. No effusion. Mild diffuse subcutaneous edema. IMPRESSION: 1. Severe tricompartmental degenerative arthritis. Electronically Signed   By: Fidela Salisbury M.D.   On: 10/23/2022 22:22   DG Ankle 2 Views Left  Result Date: 10/23/2022 CLINICAL DATA:  Fall, left ankle  pain EXAM: LEFT ANKLE - 2 VIEW COMPARISON:  None FINDINGS: Normal alignment. No acute fracture or dislocation. Mild tibiotalar degenerative arthritis. No ankle effusion. Diffuse subcutaneous edema with bimalleolar soft tissue swelling IMPRESSION: 1. Soft tissue swelling. No acute fracture or dislocation. Electronically Signed   By: Fidela Salisbury M.D.   On: 10/23/2022 22:22   DG Ankle 2 Views Right  Result Date: 10/23/2022 CLINICAL DATA:  Fall, right ankle pain EXAM: RIGHT ANKLE - 2 VIEW COMPARISON:  None Available. FINDINGS: Normal alignment. No acute fracture or dislocation. Moderate tibiotalar degenerative arthritis. No effusion. Diffuse subcutaneous edema with bimalleolar soft tissue swelling. IMPRESSION: 1. Soft tissue swelling. No acute fracture or dislocation. Electronically Signed   By: Fidela Salisbury M.D.   On: 10/23/2022 22:21   DG Foot 2 Views Right  Result Date: 10/23/2022 CLINICAL DATA:  Trauma EXAM: RIGHT FOOT - 2 VIEW COMPARISON:  None Available. FINDINGS: The bones are osteopenic. There is no evidence of fracture or dislocation. There is no evidence of arthropathy or other focal bone abnormality. Soft tissues are unremarkable. IMPRESSION: 1. No acute fracture or dislocation. 2. Osteopenia. Electronically Signed   By: Ronney Asters M.D.   On: 10/23/2022 22:20   DG Foot 2 Views Left  Result Date: 10/23/2022 CLINICAL DATA:  Fall, left foot pain EXAM: LEFT FOOT - 2 VIEW COMPARISON:  None Available. 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 Otherwise normal alignment. No acute fracture or dislocation. Joint spaces are preserved. Soft tissues are unremarkable. IMPRESSION: 1. 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 Electronically Signed   By: Fidela Salisbury M.D.   On: 10/23/2022 22:20    EKG: Independently reviewed.  NSR with rate 75; nonspecific ST changes with no evidence of acute ischemia No previous EKG   Labs on Admission: I have personally reviewed the  available labs and imaging studies at the time of the admission.  Pertinent labs:   wbc: 3.6,  hgb: 10.9,  platelets: 131,  COVID positive  Assessment and Plan: Principal Problem:   Acute encephalopathy in setting of covid 19 Active Problems:   Weakness   Pancytopenia (HCC)   HTN (hypertension)   COVID-19 virus infection   Neuropathic pain   S/P lumbar fusion    Assessment and Plan: * Acute encephalopathy in setting of covid 54 82 year old female with 2 day history of weakness, fall and fever with intermittent confusion found to have covid-19 -obs to telemetry -airborne/droplet precautions -PPE -check covid labs -no respiratory symptoms, no oxygen requirement and CXR clear -family thinking about paxlovid -vitamins, antitussive if needed, SABA prn  -IS to bedside  -CT head  with no acute finding -troponin pending, EKG with no acute finding -metabolic labs pending -BC pending, UA clean  -gentle IVF   Weakness Likely secondary to covid 19 and debility Check TSH/B12 UA with no signs of infection  PT/OT   Pancytopenia (HCC) Likely secondary to covid-19 Continue to trend Check iron studies   HTN (hypertension) Well controlled  Continue vasotec 20mg  BID Hctz 50mg  daily  Toporol xl 100mg  daily   S/P lumbar fusion Continue oxycontin BID Pmp website checked and px verified   Neuropathic pain Continue gabapentin TID Continue cymbalta     Advance Care Planning:   Code Status: Full Code   Consults: PT/OT   DVT Prophylaxis: lovenox   Family Communication: son at beside   Severity of Illness: The appropriate patient status for this patient is OBSERVATION. Observation status is judged to be reasonable and necessary in order to provide the required intensity of service to ensure the patient's safety. The patient's presenting symptoms, physical exam findings, and initial radiographic and laboratory data in the context of their medical condition is felt to place  them at decreased risk for further clinical deterioration. Furthermore, it is anticipated that the patient will be medically stable for discharge from the hospital within 2 midnights of admission.   Author: Orma Flaming, MD 10/25/2022 1:22 AM  For on call review [email or web address].

## 2022-10-24 NOTE — ED Notes (Signed)
The pt just returned from xray 

## 2022-10-24 NOTE — Discharge Instructions (Addendum)
It was our pleasure to provide your ER care today - we hope that you feel better.  Drink plenty of fluids/stay well hydrated.   Take antibiotic as prescribed.   Fall precautions - Use walker/wheelchair, and great care/caution when up to help minimize risk of falling.   Follow up closely with primary care doctor in the coming week.  Return to ER if worse, new symptoms, high fevers, new or worsening or severe pain, spreading redness, increased swelling, chest pain, trouble breathing, weak/fainting, or other concern.

## 2022-10-24 NOTE — ED Provider Notes (Signed)
  Physical Exam  BP 125/70   Pulse 86   Temp 98.9 F (37.2 C) (Oral)   Resp 15   SpO2 91%   Physical Exam  Procedures  Procedures  ED Course / MDM   Clinical Course as of 10/26/22 1231  Fri Oct 24, 2022  1625 82 yo F with fall. On plavix. Had multiple x-rays after a fall. Now has generalized weakness. Family states she felt warm as well and confused. Urinalysis is WNL. Generally weak on exam but uses a WC and walker at baseline. Awaiting head CT at this time with her weakness. On the RLE that has lymphedema there is an area that has possible cellulitis. Plan to send home on keflex if everything is normal.  [RP]  2343 Patient reassessed and is COVID-positive.  Is satting well on room air but was unable to perform walking pulse ox without two-person assist.  Lives at home with her daughter and has occasional help but per her family members this is a significant decline from prior.  Will admit to medicine for further management of her COVID since she is high risk for decline and is at the start of her illness today.  Will also have her evaluated for her ambulatory dysfunction.  Dr Rogers Blocker from the hospitalist to admit the patient. [RP]    Clinical Course User Index [RP] Fransico Meadow, MD   Medical Decision Making Amount and/or Complexity of Data Reviewed Labs: ordered. Radiology: ordered. ECG/medicine tests: ordered.  Risk Prescription drug management. Decision regarding hospitalization.     Fransico Meadow, MD 10/26/22 [phone]

## 2022-10-24 NOTE — ED Triage Notes (Addendum)
BIBEMS from home. EMS called for unresponsive patient. Upon EMS arrival patient is alert and orientedx4. Family reporting that patient has been having increased weakness, malaise, fever, and warm to touch. Patient having increased incontinence and more difficulty moving then normal. Patient given prescribed oxycodone and tylenol prior to EMS arrival. Patient reporting 10/10 pain in legs

## 2022-10-25 ENCOUNTER — Encounter (HOSPITAL_COMMUNITY): Payer: Self-pay | Admitting: Family Medicine

## 2022-10-25 DIAGNOSIS — R531 Weakness: Secondary | ICD-10-CM

## 2022-10-25 DIAGNOSIS — G9340 Encephalopathy, unspecified: Secondary | ICD-10-CM | POA: Diagnosis not present

## 2022-10-25 LAB — COMPREHENSIVE METABOLIC PANEL WITH GFR
ALT: 19 U/L (ref 0–44)
AST: 41 U/L (ref 15–41)
Albumin: 2.9 g/dL — ABNORMAL LOW (ref 3.5–5.0)
Alkaline Phosphatase: 57 U/L (ref 38–126)
Anion gap: 7 (ref 5–15)
BUN: 12 mg/dL (ref 8–23)
CO2: 29 mmol/L (ref 22–32)
Calcium: 8.5 mg/dL — ABNORMAL LOW (ref 8.9–10.3)
Chloride: 99 mmol/L (ref 98–111)
Creatinine, Ser: 0.74 mg/dL (ref 0.44–1.00)
GFR, Estimated: >60 mL/min
Glucose, Bld: 113 mg/dL — ABNORMAL HIGH (ref 70–99)
Potassium: 3.8 mmol/L (ref 3.5–5.1)
Sodium: 135 mmol/L (ref 135–145)
Total Bilirubin: 0.6 mg/dL (ref 0.3–1.2)
Total Protein: 7.4 g/dL (ref 6.5–8.1)

## 2022-10-25 LAB — CBC WITH DIFFERENTIAL/PLATELET
Abs Immature Granulocytes: 0.01 K/uL (ref 0.00–0.07)
Basophils Absolute: 0 K/uL (ref 0.0–0.1)
Basophils Relative: 0 %
Eosinophils Absolute: 0 K/uL (ref 0.0–0.5)
Eosinophils Relative: 0 %
HCT: 34.1 % — ABNORMAL LOW (ref 36.0–46.0)
Hemoglobin: 11 g/dL — ABNORMAL LOW (ref 12.0–15.0)
Immature Granulocytes: 0 %
Lymphocytes Relative: 19 %
Lymphs Abs: 0.6 K/uL — ABNORMAL LOW (ref 0.7–4.0)
MCH: 29.9 pg (ref 26.0–34.0)
MCHC: 32.3 g/dL (ref 30.0–36.0)
MCV: 92.7 fL (ref 80.0–100.0)
Monocytes Absolute: 0.7 K/uL (ref 0.1–1.0)
Monocytes Relative: 24 %
Neutro Abs: 1.7 K/uL (ref 1.7–7.7)
Neutrophils Relative %: 57 %
Platelets: 135 K/uL — ABNORMAL LOW (ref 150–400)
RBC: 3.68 MIL/uL — ABNORMAL LOW (ref 3.87–5.11)
RDW: 15.1 % (ref 11.5–15.5)
WBC: 3 K/uL — ABNORMAL LOW (ref 4.0–10.5)
nRBC: 0 % (ref 0.0–0.2)

## 2022-10-25 LAB — IRON AND TIBC
Iron: 26 ug/dL — ABNORMAL LOW (ref 28–170)
Saturation Ratios: 12 % (ref 10.4–31.8)
TIBC: 214 ug/dL — ABNORMAL LOW (ref 250–450)
UIBC: 188 ug/dL

## 2022-10-25 LAB — VITAMIN B12: Vitamin B-12: 873 pg/mL (ref 180–914)

## 2022-10-25 LAB — AMMONIA: Ammonia: 34 umol/L (ref 9–35)

## 2022-10-25 LAB — C-REACTIVE PROTEIN: CRP: 4.3 mg/dL — ABNORMAL HIGH

## 2022-10-25 LAB — CK: Total CK: 373 U/L — ABNORMAL HIGH (ref 38–234)

## 2022-10-25 LAB — TSH: TSH: 0.225 u[IU]/mL — ABNORMAL LOW (ref 0.350–4.500)

## 2022-10-25 LAB — FERRITIN: Ferritin: 136 ng/mL (ref 11–307)

## 2022-10-25 LAB — TROPONIN I (HIGH SENSITIVITY): Troponin I (High Sensitivity): 25 ng/L — ABNORMAL HIGH (ref <18)

## 2022-10-25 LAB — PROCALCITONIN: Procalcitonin: <0.1 ng/mL

## 2022-10-25 LAB — D-DIMER, QUANTITATIVE: D-Dimer, Quant: 1.99 ug/mL-FEU — ABNORMAL HIGH (ref 0.00–0.50)

## 2022-10-25 LAB — FIBRINOGEN: Fibrinogen: 688 mg/dL — ABNORMAL HIGH (ref 210–475)

## 2022-10-25 LAB — BRAIN NATRIURETIC PEPTIDE: B Natriuretic Peptide: 298.7 pg/mL — ABNORMAL HIGH (ref 0.0–100.0)

## 2022-10-25 LAB — MAGNESIUM: Magnesium: 1.7 mg/dL (ref 1.7–2.4)

## 2022-10-25 MED ORDER — CEPHALEXIN 500 MG PO CAPS
500.0000 mg | ORAL_CAPSULE | Freq: Three times a day (TID) | ORAL | Status: AC
  Administered 2022-10-25 – 2022-10-26 (×5): 500 mg via ORAL
  Filled 2022-10-25 (×2): qty 2
  Filled 2022-10-25 (×3): qty 1

## 2022-10-25 MED ORDER — METOPROLOL SUCCINATE ER 50 MG PO TB24
100.0000 mg | ORAL_TABLET | Freq: Every day | ORAL | Status: DC
  Administered 2022-10-26 – 2022-10-28 (×3): 100 mg via ORAL
  Filled 2022-10-25 (×4): qty 2

## 2022-10-25 MED ORDER — ATORVASTATIN CALCIUM 10 MG PO TABS
10.0000 mg | ORAL_TABLET | Freq: Every day | ORAL | Status: DC
  Administered 2022-10-25 – 2022-11-03 (×10): 10 mg via ORAL
  Filled 2022-10-25 (×10): qty 1

## 2022-10-25 MED ORDER — DULOXETINE HCL 60 MG PO CPEP
60.0000 mg | ORAL_CAPSULE | Freq: Every day | ORAL | Status: DC
  Administered 2022-10-25 – 2022-11-03 (×10): 60 mg via ORAL
  Filled 2022-10-25 (×5): qty 1
  Filled 2022-10-25: qty 2
  Filled 2022-10-25 (×4): qty 1

## 2022-10-25 MED ORDER — CLOPIDOGREL BISULFATE 75 MG PO TABS
75.0000 mg | ORAL_TABLET | Freq: Every day | ORAL | Status: DC
  Administered 2022-10-25 – 2022-11-03 (×10): 75 mg via ORAL
  Filled 2022-10-25 (×10): qty 1

## 2022-10-25 MED ORDER — HYDROCHLOROTHIAZIDE 25 MG PO TABS
50.0000 mg | ORAL_TABLET | Freq: Every day | ORAL | Status: DC
  Administered 2022-10-25 – 2022-10-28 (×4): 50 mg via ORAL
  Filled 2022-10-25 (×5): qty 2

## 2022-10-25 MED ORDER — ACETAMINOPHEN 650 MG RE SUPP: 650.0000 mg | Freq: Four times a day (QID) | RECTAL | Status: DC | PRN

## 2022-10-25 MED ORDER — POLYETHYLENE GLYCOL 3350 17 G PO PACK
17.0000 g | PACK | Freq: Every day | ORAL | Status: DC
  Administered 2022-10-25 – 2022-11-03 (×10): 17 g via ORAL
  Filled 2022-10-25 (×9): qty 1

## 2022-10-25 MED ORDER — OXYCODONE HCL 5 MG PO TABS
10.0000 mg | ORAL_TABLET | Freq: Four times a day (QID) | ORAL | Status: DC | PRN
  Administered 2022-10-25: 10 mg via ORAL
  Filled 2022-10-25: qty 2

## 2022-10-25 MED ORDER — DOCUSATE SODIUM 100 MG PO CAPS
100.0000 mg | ORAL_CAPSULE | Freq: Every day | ORAL | Status: DC
  Administered 2022-10-25 – 2022-11-02 (×9): 100 mg via ORAL
  Filled 2022-10-25 (×8): qty 1

## 2022-10-25 MED ORDER — OXYCODONE HCL ER 10 MG PO T12A
30.0000 mg | EXTENDED_RELEASE_TABLET | Freq: Two times a day (BID) | ORAL | Status: DC
  Administered 2022-10-25 – 2022-11-03 (×19): 30 mg via ORAL
  Filled 2022-10-25 (×13): qty 3
  Filled 2022-10-25: qty 2
  Filled 2022-10-25 (×5): qty 3
  Filled 2022-10-25: qty 2

## 2022-10-25 MED ORDER — ENOXAPARIN SODIUM 40 MG/0.4ML IJ SOSY
40.0000 mg | PREFILLED_SYRINGE | INTRAMUSCULAR | Status: DC
  Administered 2022-10-25 – 2022-11-03 (×10): 40 mg via SUBCUTANEOUS
  Filled 2022-10-25 (×10): qty 0.4

## 2022-10-25 MED ORDER — GABAPENTIN 400 MG PO CAPS
400.0000 mg | ORAL_CAPSULE | Freq: Three times a day (TID) | ORAL | Status: DC
  Administered 2022-10-25 – 2022-11-03 (×27): 400 mg via ORAL
  Filled 2022-10-25 (×27): qty 1

## 2022-10-25 MED ORDER — ACETAMINOPHEN 325 MG PO TABS
650.0000 mg | ORAL_TABLET | Freq: Four times a day (QID) | ORAL | Status: DC | PRN
  Administered 2022-10-25: 650 mg via ORAL
  Filled 2022-10-25: qty 2

## 2022-10-25 MED ORDER — ENALAPRIL MALEATE 10 MG PO TABS
20.0000 mg | ORAL_TABLET | Freq: Two times a day (BID) | ORAL | Status: DC
  Administered 2022-10-25 – 2022-10-28 (×8): 20 mg via ORAL
  Filled 2022-10-25 (×2): qty 8
  Filled 2022-10-25 (×6): qty 2
  Filled 2022-10-25: qty 8
  Filled 2022-10-25 (×2): qty 2
  Filled 2022-10-25: qty 8
  Filled 2022-10-25 (×2): qty 2

## 2022-10-25 MED ORDER — SODIUM CHLORIDE 0.9 % IV SOLN: INTRAVENOUS | Status: AC

## 2022-10-25 NOTE — Progress Notes (Signed)
PROGRESS NOTE    Kathleen [name]  [id number] DOB: 1940-10-13 DOA: 10/24/2022 PCP: Garwin Brothers, MD    Brief Narrative:  82 year old with history of hypertension, osteoarthritis, spinal stenosis and ambulatory dysfunction, chronic pain syndrome on oxycodone at home who was brought to the emergency room with weakness and confusion, low-grade fever and profound debility.  She was in the emergency room on 1/25 with leg weakness and fall, skeletal survey was negative and she was discharged home.  When she went home she was found to be more weak and debilitated, temperature 101 so brought back.  In the emergency room hemodynamically stable.  COVID-19 was positive.  She was also found to have a small area of cellulitis on the right medial thigh.  On room air.  Admitted due to profound debility from COVID-19.   Assessment & Plan:   Acute infective encephalopathy secondary to COVID-19 infection: Nonfocal exam.  Adequately improved and back to normal mentation now.  On room air.  No pulmonary symptoms.  Family declined Paxlovid. Continue supportive care.  PT OT as above.  Profound debility, chronic pain syndrome, spinal stenosis, Patient on chronic pain management.  Continue long-acting oxycodone and short acting oxycodone.  Mobilize with PT OT.  She is also on gabapentin and Cymbalta that will be continued. She will likely need skilled nursing facility, transfer when available.  Right medial thigh cellulitis: Without any obvious abscess or collections.  Keflex for 5 days.  Essential hypertension: Blood pressure stable on Vasotec, hydrochlorothiazide and Toprol-XL.  Work with PT OT.  Transfer to SNF with pace program whenever available.     DVT prophylaxis: enoxaparin (LOVENOX) injection 40 mg Start: 10/25/22 1000   Code Status: Full code Family Communication: Daughter at the bedside Disposition Plan: Status is: Observation The patient remains OBS appropriate and will d/c before 2  midnights.     Consultants:  None  Procedures:  None  Antimicrobials:  Keflex 1/26----   Subjective: Patient seen in the morning rounds.  Denies any complaints.  She was eating breakfast.  Poor historian.  Afebrile overnight.  She was not confident whether she can get out of the bed.  Daughter was at bedside tells me that herself and her brother could not help her get out of the bed at home.  Looks better than yesterday as per daughter.  Objective: Vitals:   10/24/22 2200 10/25/22 0230 10/25/22 0500 10/25/22 1200  BP: 134/89 (!) 149/88 (!) 143/90 (!) 144/89  Pulse: 77 76 77 77  Resp: 17 (!) 29 (!) 31 13  Temp:    98.9 F (37.2 C)  TempSrc:    Oral  SpO2: 91% (!) 81% 93% 97%    Intake/Output Summary (Last 24 hours) at 10/25/2022 1347 Last data filed at 10/25/2022 1256 Gross per 24 hour  Intake 637.5 ml  Output 800 ml  Net -162.5 ml   There were no vitals filed for this visit.  Examination:  General exam: Appears calm and comfortable  Frail and debilitated.  Not in distress.  Pleasant to conversation. Respiratory system: Clear to auscultation. Respiratory effort normal.  No added sounds. Cardiovascular system: S1 & S2 heard, RRR. No JVD, murmurs, rubs, gallops or clicks.  No pitting edema.  She has obese extremities and difficult to move. Gastrointestinal system: Nondistended.  Large and pendulous.  Bowel sound present. Central nervous system: Alert and awake.  Oriented x 2-3.  Pleasant to conversation. Extremities: Symmetric 5 x 5 power.  Profound generalized weakness.  Data Reviewed: I have personally reviewed following labs and imaging studies  CBC: Recent Labs  Lab 10/24/22 1203 10/25/22 0031  WBC 3.6* 3.0*  NEUTROABS  --  1.7  HGB 10.9* 11.0*  HCT 33.2* 34.1*  MCV 90.2 92.7  PLT 131* 607*   Basic Metabolic Panel: Recent Labs  Lab 10/24/22 1203 10/25/22 0031 10/25/22 0154  NA 135 135  --   K 3.8 3.8  --   CL 99 99  --   CO2 28 29  --    GLUCOSE 108* 113*  --   BUN 15 12  --   CREATININE 0.71 0.74  --   CALCIUM 8.6* 8.5*  --   MG  --   --  1.7   GFR: CrCl cannot be calculated (Unknown ideal weight.). Liver Function Tests: Recent Labs  Lab 10/24/22 1203 10/25/22 0031  AST 34 41  ALT 18 19  ALKPHOS 53 57  BILITOT 0.7 0.6  PROT 6.9 7.4  ALBUMIN 2.8* 2.9*   No results for input(s): "LIPASE", "AMYLASE" in the last 168 hours. Recent Labs  Lab 10/25/22 0031  AMMONIA 34   Coagulation Profile: No results for input(s): "INR", "PROTIME" in the last 168 hours. Cardiac Enzymes: Recent Labs  Lab 10/25/22 0154  CKTOTAL 373*   BNP (last 3 results) No results for input(s): "PROBNP" in the last 8760 hours. HbA1C: No results for input(s): "HGBA1C" in the last 72 hours. CBG: No results for input(s): "GLUCAP" in the last 168 hours. Lipid Profile: No results for input(s): "CHOL", "HDL", "LDLCALC", "TRIG", "CHOLHDL", "LDLDIRECT" in the last 72 hours. Thyroid Function Tests: Recent Labs    10/25/22 0031  TSH 0.225*   Anemia Panel: Recent Labs    10/25/22 0031 10/25/22 0154  VITAMINB12 873  --   FERRITIN  --  136  TIBC  --  214*  IRON  --  26*   Sepsis Labs: Recent Labs  Lab 10/25/22 0154  PROCALCITON <0.10    Recent Results (from the past 240 hour(s))  Resp panel by RT-PCR (RSV, Flu A&B, Covid) Anterior Nasal Swab     Status: Abnormal   Collection Time: 10/24/22  2:55 PM   Specimen: Anterior Nasal Swab  Result Value Ref Range Status   SARS Coronavirus 2 by RT PCR POSITIVE (A) NEGATIVE Final    Comment: (NOTE) SARS-CoV-2 target nucleic acids are DETECTED.  The SARS-CoV-2 RNA is generally detectable in upper respiratory specimens during the acute phase of infection. Positive results are indicative of the presence of the identified virus, but do not rule out bacterial infection or co-infection with other pathogens not detected by the test. Clinical correlation with patient history and other  diagnostic information is necessary to determine patient infection status. The expected result is Negative.  Fact Sheet for Patients: EntrepreneurPulse.com.au  Fact Sheet for Healthcare Providers: IncredibleEmployment.be  This test is not yet approved or cleared by the [place] FDA and  has been authorized for detection and/or diagnosis of SARS-CoV-2 by FDA under an Emergency Use Authorization (EUA).  This EUA will remain in effect (meaning this test can be used) for the duration of  the COVID-19 declaration under Section 564(b)(1) of the A ct, 21 U.S.C. section 360bbb-3(b)(1), unless the authorization is terminated or revoked sooner.     Influenza A by PCR NEGATIVE NEGATIVE Final   Influenza B by PCR NEGATIVE NEGATIVE Final    Comment: (NOTE) The Xpert Xpress SARS-CoV-2/FLU/RSV plus assay is intended as an aid in  the diagnosis of influenza from Nasopharyngeal swab specimens and should not be used as a sole basis for treatment. Nasal washings and aspirates are unacceptable for Xpert Xpress SARS-CoV-2/FLU/RSV testing.  Fact Sheet for Patients: BloggerCourse.com  Fact Sheet for Healthcare Providers: SeriousBroker.it  This test is not yet approved or cleared by the [place] FDA and has been authorized for detection and/or diagnosis of SARS-CoV-2 by FDA under an Emergency Use Authorization (EUA). This EUA will remain in effect (meaning this test can be used) for the duration of the COVID-19 declaration under Section 564(b)(1) of the Act, 21 U.S.C. section 360bbb-3(b)(1), unless the authorization is terminated or revoked.     Resp Syncytial Virus by PCR NEGATIVE NEGATIVE Final    Comment: (NOTE) Fact Sheet for Patients: BloggerCourse.com  Fact Sheet for Healthcare Providers: SeriousBroker.it  This test is not yet approved or  cleared by the [place] FDA and has been authorized for detection and/or diagnosis of SARS-CoV-2 by FDA under an Emergency Use Authorization (EUA). This EUA will remain in effect (meaning this test can be used) for the duration of the COVID-19 declaration under Section 564(b)(1) of the Act, 21 U.S.C. section 360bbb-3(b)(1), unless the authorization is terminated or revoked.  Performed at [facility], [address], [place], [place] [address]          Radiology Studies: DG CHEST PORT 1 VIEW  Result Date: 10/25/2022 CLINICAL DATA:  Increased weakness and COVID-19 positivity, initial encounter EXAM: PORTABLE CHEST 1 VIEW COMPARISON:  None Available. FINDINGS: Cardiac shadow is enlarged. Postsurgical changes are noted in the lower thoracic spine. Lungs are clear. No focal abnormality is noted. IMPRESSION: No acute abnormality noted. Electronically Signed   By: Alcide Clever M.D.   On: 10/25/2022 00:12   CT Head Wo Contrast  Result Date: 10/24/2022 CLINICAL DATA:  Mental status change, unknown cause EXAM: CT HEAD WITHOUT CONTRAST TECHNIQUE: Contiguous axial images were obtained from the base of the skull through the vertex without intravenous contrast. RADIATION DOSE REDUCTION: This exam was performed according to the departmental dose-optimization program which includes automated exposure control, adjustment of the mA and/or kV according to patient size and/or use of iterative reconstruction technique. COMPARISON:  None Available. FINDINGS: Brain: No intracranial hemorrhage, mass effect, or midline shift. Normal for age atrophy with mild periventricular and deep white matter hypodensity typical of chronic small vessel ischemia. No hydrocephalus. The basilar cisterns are patent. No evidence of territorial infarct or acute ischemia. Enlarged partially empty sella. No extra-axial or intracranial fluid collection. Vascular: Atherosclerosis of skullbase vasculature without hyperdense  vessel or abnormal calcification. Skull: No acute findings. Frontal hyperostosis. Sinuses/Orbits: No acute findings. Bilateral lens resection. Other: None. IMPRESSION: 1. No acute intracranial abnormality. 2. Mild chronic small vessel ischemia. 3. Enlarged partially empty sella, typically incidental but can be seen in the setting of intracranial hypertension. Electronically Signed   By: Narda Rutherford M.D.   On: 10/24/2022 19:47   DG Knee Complete 4 Views Right  Result Date: 10/23/2022 CLINICAL DATA:  Fall, right knee pain EXAM: RIGHT KNEE - COMPLETE 4+ VIEW COMPARISON:  None Available. 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 No acute fracture or dislocation. No effusion. Mild diffuse subcutaneous edema. IMPRESSION: 1. Severe tricompartmental degenerative arthritis. Electronically Signed   By: Helyn Numbers M.D.   On: 10/23/2022 22:22   DG Ankle 2 Views Left  Result Date: 10/23/2022 CLINICAL DATA:  Fall, left ankle pain EXAM: LEFT ANKLE - 2 VIEW COMPARISON:  None FINDINGS: Normal alignment. No acute fracture or dislocation. Mild tibiotalar degenerative arthritis. No ankle effusion. Diffuse subcutaneous edema with bimalleolar soft tissue swelling IMPRESSION: 1. Soft tissue swelling. No acute fracture or dislocation. Electronically Signed   By: Fidela Salisbury M.D.   On: 10/23/2022 22:22   DG Ankle 2 Views Right  Result Date: 10/23/2022 CLINICAL DATA:  Fall, right ankle pain EXAM: RIGHT ANKLE - 2 VIEW COMPARISON:  None Available. FINDINGS: Normal alignment. No acute fracture or dislocation. Moderate tibiotalar degenerative arthritis. No effusion. Diffuse subcutaneous edema with bimalleolar soft tissue swelling. IMPRESSION: 1. Soft tissue swelling. No acute fracture or dislocation. Electronically Signed   By: Fidela Salisbury M.D.   On: 10/23/2022 22:21   DG Foot 2 Views Right  Result Date: 10/23/2022 CLINICAL DATA:  Trauma EXAM: RIGHT FOOT - 2 VIEW COMPARISON:   None Available. FINDINGS: The bones are osteopenic. There is no evidence of fracture or dislocation. There is no evidence of arthropathy or other focal bone abnormality. Soft tissues are unremarkable. IMPRESSION: 1. No acute fracture or dislocation. 2. Osteopenia. Electronically Signed   By: Ronney Asters M.D.   On: 10/23/2022 22:20   DG Foot 2 Views Left  Result Date: 10/23/2022 CLINICAL DATA:  Fall, left foot pain EXAM: LEFT FOOT - 2 VIEW COMPARISON:  None Available. 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 Otherwise normal alignment. No acute fracture or dislocation. Joint spaces are preserved. Soft tissues are unremarkable. IMPRESSION: 1. 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 Electronically Signed   By: Fidela Salisbury M.D.   On: 10/23/2022 22:20        Scheduled Meds:  vitamin C  500 mg Oral Daily   atorvastatin  10 mg Oral Daily   cephALEXin  500 mg Oral Q8H   clopidogrel  75 mg Oral Daily   docusate sodium  100 mg Oral QHS   DULoxetine  60 mg Oral Daily   enalapril  20 mg Oral BID   enoxaparin (LOVENOX) injection  40 mg Subcutaneous Q24H   gabapentin  400 mg Oral TID   hydrochlorothiazide  50 mg Oral Daily   metoprolol succinate  100 mg Oral Daily   oxyCODONE  30 mg Oral Q12H   zinc sulfate  220 mg Oral Daily   Continuous Infusions:   LOS: 0 days    Time spent: 35 minutes    Barb Merino, MD [facility] Pager [phone]

## 2022-10-25 NOTE — Assessment & Plan Note (Signed)
Continue oxycontin BID Pmp website checked and px verified

## 2022-10-25 NOTE — ED Notes (Signed)
Pt on purewick, denies pain at this time. Call light within reach

## 2022-10-25 NOTE — Assessment & Plan Note (Signed)
82 year old female with 2 day history of weakness, fall and fever with intermittent confusion found to have covid-19 -obs to telemetry -airborne/droplet precautions -PPE -check covid labs -no respiratory symptoms, no oxygen requirement and CXR clear -family thinking about paxlovid -vitamins, antitussive if needed, SABA prn  -IS to bedside  -CT head with no acute finding -troponin pending, EKG with no acute finding -metabolic labs pending -BC pending, UA clean  -gentle IVF

## 2022-10-25 NOTE — ED Notes (Addendum)
ED TO INPATIENT HANDOFF REPORT  ED Nurse Name and Phone #: Deja Kaigler 89  S Name/Age/Gender Kathleen [name] 82 y.o. female Room/Bed: 029C/029C  Code Status   Code Status: Full Code  Home/SNF/Other Rehab Patient oriented to: self, place, time, and situation Is this baseline? Yes   Triage Complete: Triage complete  Chief Complaint Acute encephalopathy [G93.40]  Triage Note BIBEMS from home. EMS called for unresponsive patient. Upon EMS arrival patient is alert and orientedx4. Family reporting that patient has been having increased weakness, malaise, fever, and warm to touch. Patient having increased incontinence and more difficulty moving then normal. Patient given prescribed oxycodone and tylenol prior to EMS arrival. Patient reporting 10/10 pain in legs   Allergies Allergies  Allergen Reactions   Aspirin Nausea And Vomiting    Level of Care/Admitting Diagnosis ED Disposition     ED Disposition  Admit   Condition  --   [place]: [place] [[id number]]  Level of Care: Telemetry Medical [104]  May place patient in observation at [facility] or [facility] if equivalent level of care is available:: Yes  Covid Evaluation: Confirmed COVID Positive  Diagnosis: Acute encephalopathy [id number]  Admitting Physician: Orma Flaming [id number]  Attending Physician: Orma Flaming [id number]          B Medical/Surgery History No past medical history on file. No past surgical history on file.   A IV Location/Drains/Wounds Patient Lines/Drains/Airways Status     Active Line/Drains/Airways     Name Placement date Placement time Site Days   Peripheral IV 10/25/22 20 G Posterior;Right Hand 10/25/22  0030  Hand  less than 1   Peripheral IV 10/25/22 20 G Anterior;Left;Proximal Forearm 10/25/22  0030  Forearm  less than 1            Intake/Output Last 24 hours  Intake/Output Summary (Last 24 hours) at 10/25/2022 1042 Last data filed at  10/25/2022 0602 Gross per 24 hour  Intake --  Output 800 ml  Net -800 ml    Labs/Imaging Results for orders placed or performed during the hospital encounter of 10/24/22 (from the past 48 hour(s))  CBC     Status: Abnormal   Collection Time: 10/24/22 12:03 PM  Result Value Ref Range   WBC 3.6 (L) 4.0 - 10.5 K/uL   RBC 3.68 (L) 3.87 - 5.11 MIL/uL   Hemoglobin 10.9 (L) 12.0 - 15.0 g/dL   HCT 33.2 (L) 36.0 - 46.0 %   MCV 90.2 80.0 - 100.0 fL   MCH 29.6 26.0 - 34.0 pg   MCHC 32.8 30.0 - 36.0 g/dL   RDW 14.7 11.5 - 15.5 %   Platelets 131 (L) 150 - 400 K/uL    Comment: REPEATED TO VERIFY   nRBC 0.0 0.0 - 0.2 %    Comment: Performed at [facility], [place] [address] [place], [place] [address]  Comprehensive metabolic panel     Status: Abnormal   Collection Time: 10/24/22 12:03 PM  Result Value Ref Range   Sodium 135 135 - 145 mmol/L   Potassium 3.8 3.5 - 5.1 mmol/L   Chloride 99 98 - 111 mmol/L   CO2 28 22 - 32 mmol/L   Glucose, Bld 108 (H) 70 - 99 mg/dL    Comment: Glucose reference range applies only to samples taken after fasting for at least 8 hours.   BUN 15 8 - 23 mg/dL   Creatinine, Ser 0.71 0.44 - 1.00 mg/dL  Calcium 8.6 (L) 8.9 - 10.3 mg/dL   Total Protein 6.9 6.5 - 8.1 g/dL   Albumin 2.8 (L) 3.5 - 5.0 g/dL   AST 34 15 - 41 U/L   ALT 18 0 - 44 U/L   Alkaline Phosphatase 53 38 - 126 U/L   Total Bilirubin 0.7 0.3 - 1.2 mg/dL   GFR, Estimated >60 >60 mL/min    Comment: (NOTE) Calculated using the CKD-EPI Creatinine Equation (2021)    Anion gap 8 5 - 15    Comment: Performed at [facility] [address] [place], [place] [address]  Urinalysis, Routine w reflex microscopic -Urine, Catheterized     Status: None   Collection Time: 10/24/22  2:16 PM  Result Value Ref Range   Color, Urine YELLOW YELLOW   APPearance CLEAR CLEAR   Specific Gravity, Urine 1.010 1.005 - 1.030   pH 6.0 5.0 - 8.0   Glucose, UA NEGATIVE NEGATIVE mg/dL   Hgb urine dipstick  NEGATIVE NEGATIVE   Bilirubin Urine NEGATIVE NEGATIVE   Ketones, ur NEGATIVE NEGATIVE mg/dL   Protein, ur NEGATIVE NEGATIVE mg/dL   Nitrite NEGATIVE NEGATIVE   Leukocytes,Ua NEGATIVE NEGATIVE    Comment: Performed at [facility] [address], [place], [place] [address]  Resp panel by RT-PCR (RSV, Flu A&B, Covid) Anterior Nasal Swab     Status: Abnormal   Collection Time: 10/24/22  2:55 PM   Specimen: Anterior Nasal Swab  Result Value Ref Range   SARS Coronavirus 2 by RT PCR POSITIVE (A) NEGATIVE    Comment: (NOTE) SARS-CoV-2 target nucleic acids are DETECTED.  The SARS-CoV-2 RNA is generally detectable in upper respiratory specimens during the acute phase of infection. Positive results are indicative of the presence of the identified virus, but do not rule out bacterial infection or co-infection with other pathogens not detected by the test. Clinical correlation with patient history and other diagnostic information is necessary to determine patient infection status. The expected result is Negative.  Fact Sheet for Patients: EntrepreneurPulse.com.au  Fact Sheet for Healthcare Providers: IncredibleEmployment.be  This test is not yet approved or cleared by the [place] FDA and  has been authorized for detection and/or diagnosis of SARS-CoV-2 by FDA under an Emergency Use Authorization (EUA).  This EUA will remain in effect (meaning this test can be used) for the duration of  the COVID-19 declaration under Section 564(b)(1) of the A ct, 21 U.S.C. section 360bbb-3(b)(1), unless the authorization is terminated or revoked sooner.     Influenza A by PCR NEGATIVE NEGATIVE   Influenza B by PCR NEGATIVE NEGATIVE    Comment: (NOTE) The Xpert Xpress SARS-CoV-2/FLU/RSV plus assay is intended as an aid in the diagnosis of influenza from Nasopharyngeal swab specimens and should not be used as a sole basis for treatment. Nasal washings  and aspirates are unacceptable for Xpert Xpress SARS-CoV-2/FLU/RSV testing.  Fact Sheet for Patients: EntrepreneurPulse.com.au  Fact Sheet for Healthcare Providers: IncredibleEmployment.be  This test is not yet approved or cleared by the [place] FDA and has been authorized for detection and/or diagnosis of SARS-CoV-2 by FDA under an Emergency Use Authorization (EUA). This EUA will remain in effect (meaning this test can be used) for the duration of the COVID-19 declaration under Section 564(b)(1) of the Act, 21 U.S.C. section 360bbb-3(b)(1), unless the authorization is terminated or revoked.     Resp Syncytial Virus by PCR NEGATIVE NEGATIVE    Comment: (NOTE) Fact Sheet for Patients: EntrepreneurPulse.com.au  Fact Sheet for Healthcare Providers: IncredibleEmployment.be  This test is not yet approved or cleared by the [place] FDA and has been authorized for detection and/or diagnosis of SARS-CoV-2 by FDA under an Emergency Use Authorization (EUA). This EUA will remain in effect (meaning this test can be used) for the duration of the COVID-19 declaration under Section 564(b)(1) of the Act, 21 U.S.C. section 360bbb-3(b)(1), unless the authorization is terminated or revoked.  Performed at [facility], [place] [address] [place], [place] [address]   Vitamin B12     Status: None   Collection Time: 10/25/22 12:31 AM  Result Value Ref Range   Vitamin B-12 873 180 - 914 pg/mL    Comment: (NOTE) This assay is not validated for testing neonatal or myeloproliferative syndrome specimens for Vitamin B12 levels. Performed at [facility], [place] [address], [place], [place] [address]   TSH     Status: Abnormal   Collection Time: 10/25/22 12:31 AM  Result Value Ref Range   TSH 0.225 (L) 0.350 - 4.500 uIU/mL    Comment: Performed by a 3rd Generation assay with a functional sensitivity of <=0.01  uIU/mL. Performed at [facility], [place] [address], [place], [place] [address]   Ammonia     Status: None   Collection Time: 10/25/22 12:31 AM  Result Value Ref Range   Ammonia 34 9 - 35 umol/L    Comment: HEMOLYSIS AT THIS LEVEL MAY AFFECT RESULT Performed at [facility], [place] [address], [place], [place] [address]   CBC with Differential/Platelet     Status: Abnormal   Collection Time: 10/25/22 12:31 AM  Result Value Ref Range   WBC 3.0 (L) 4.0 - 10.5 K/uL   RBC 3.68 (L) 3.87 - 5.11 MIL/uL   Hemoglobin 11.0 (L) 12.0 - 15.0 g/dL   HCT 34.1 (L) 36.0 - 46.0 %   MCV 92.7 80.0 - 100.0 fL   MCH 29.9 26.0 - 34.0 pg   MCHC 32.3 30.0 - 36.0 g/dL   RDW 15.1 11.5 - 15.5 %   Platelets 135 (L) 150 - 400 K/uL    Comment: REPEATED TO VERIFY   nRBC 0.0 0.0 - 0.2 %   Neutrophils Relative % 57 %   Neutro Abs 1.7 1.7 - 7.7 K/uL   Lymphocytes Relative 19 %   Lymphs Abs 0.6 (L) 0.7 - 4.0 K/uL   Monocytes Relative 24 %   Monocytes Absolute 0.7 0.1 - 1.0 K/uL   Eosinophils Relative 0 %   Eosinophils Absolute 0.0 0.0 - 0.5 K/uL   Basophils Relative 0 %   Basophils Absolute 0.0 0.0 - 0.1 K/uL   Immature Granulocytes 0 %   Abs Immature Granulocytes 0.01 0.00 - 0.07 K/uL    Comment: Performed at [facility], [place] [address] [place], [place] [address]  Comprehensive metabolic panel     Status: Abnormal   Collection Time: 10/25/22 12:31 AM  Result Value Ref Range   Sodium 135 135 - 145 mmol/L   Potassium 3.8 3.5 - 5.1 mmol/L   Chloride 99 98 - 111 mmol/L   CO2 29 22 - 32 mmol/L   Glucose, Bld 113 (H) 70 - 99 mg/dL    Comment: Glucose reference range applies only to samples taken after fasting for at least 8 hours.   BUN 12 8 - 23 mg/dL   Creatinine, Ser 0.74 0.44 - 1.00 mg/dL   Calcium 8.5 (L) 8.9 - 10.3  mg/dL   Total Protein 7.4 6.5 - 8.1 g/dL   Albumin 2.9 (L) 3.5 - 5.0 g/dL   AST 41 15 - 41 U/L   ALT 19 0 - 44 U/L   Alkaline Phosphatase 57 38 - 126 U/L   Total  Bilirubin 0.6 0.3 - 1.2 mg/dL   GFR, Estimated >47 >42 mL/min    Comment: (NOTE) Calculated using the CKD-EPI Creatinine Equation (2021)    Anion gap 7 5 - 15    Comment: Performed at [facility], [address], [place], [place] [address]  Troponin I (High Sensitivity)     Status: Abnormal   Collection Time: 10/25/22 12:31 AM  Result Value Ref Range   Troponin I (High Sensitivity) 25 (H) <18 ng/L    Comment: (NOTE) Elevated high sensitivity troponin I (hsTnI) values and significant  changes across serial measurements may suggest ACS but many other  chronic and acute conditions are known to elevate hsTnI results.  Refer to the "Links" section for chest pain algorithms and additional  guidance. Performed at [facility], [address] [place], [place] [address]   CK     Status: Abnormal   Collection Time: 10/25/22  1:54 AM  Result Value Ref Range   Total CK 373 (H) 38 - 234 U/L    Comment: Performed at [facility], [address] [place], [place] [address]  Brain natriuretic peptide     Status: Abnormal   Collection Time: 10/25/22  1:54 AM  Result Value Ref Range   B Natriuretic Peptide 298.7 (H) 0.0 - 100.0 pg/mL    Comment: Performed at [facility], [address] [place], [place] [address]  C-reactive protein     Status: Abnormal   Collection Time: 10/25/22  1:54 AM  Result Value Ref Range   CRP 4.3 (H) <1.0 mg/dL    Comment: Performed at [facility], [address], [place], [place] [address]  Ferritin     Status: None   Collection Time: 10/25/22  1:54 AM  Result Value Ref Range   Ferritin 136 11 - 307 ng/mL    Comment: Performed at [facility], [address] [place], [place] [address]  D-dimer, quantitative     Status: Abnormal   Collection Time: 10/25/22  1:54 AM  Result Value Ref Range   D-Dimer, Quant 1.99 (H) 0.00 - 0.50 ug/mL-FEU    Comment: (NOTE) At the manufacturer cut-off value of 0.5 g/mL FEU, this assay has  a negative predictive value of 95-100%.This assay is intended for use in conjunction with a clinical pretest probability (PTP) assessment model to exclude pulmonary embolism (PE) and deep venous thrombosis (DVT) in outpatients suspected of PE or DVT. Results should be correlated with clinical presentation. Performed at [facility], [address] [place], [place] [address]   Fibrinogen     Status: Abnormal   Collection Time: 10/25/22  1:54 AM  Result Value Ref Range   Fibrinogen 688 (H) 210 - 475 mg/dL    Comment: (NOTE) Fibrinogen results may be underestimated in patients receiving thrombolytic therapy. Performed at [facility], [address] [place], [place] [address]   Procalcitonin     Status: None   Collection Time: 10/25/22  1:54 AM  Result Value Ref Range   Procalcitonin <0.10 ng/mL    Comment:        Interpretation: PCT (Procalcitonin) <= 0.5 ng/mL: Systemic infection (  sepsis) is not likely. Local bacterial infection is possible. (NOTE)       Sepsis PCT Algorithm           Lower Respiratory Tract                                      Infection PCT Algorithm    ----------------------------     ----------------------------         PCT < 0.25 ng/mL                PCT < 0.10 ng/mL          Strongly encourage             Strongly discourage   discontinuation of antibiotics    initiation of antibiotics    ----------------------------     -----------------------------       PCT 0.25 - 0.50 ng/mL            PCT 0.10 - 0.25 ng/mL               OR       >80% decrease in PCT            Discourage initiation of                                            antibiotics      Encourage discontinuation           of antibiotics    ----------------------------     -----------------------------         PCT >= 0.50 ng/mL              PCT 0.26 - 0.50 ng/mL               AND        <80% decrease in PCT             Encourage initiation of                                              antibiotics       Encourage continuation           of antibiotics    ----------------------------     -----------------------------        PCT >= 0.50 ng/mL                  PCT > 0.50 ng/mL               AND         increase in PCT                  Strongly encourage                                      initiation of antibiotics    Strongly encourage escalation           of antibiotics                                     -----------------------------  PCT <= 0.25 ng/mL                                                 OR                                        > 80% decrease in PCT                                      Discontinue / Do not initiate                                             antibiotics  Performed at [facility], [address] [place], [place] [address]   Magnesium     Status: None   Collection Time: 10/25/22  1:54 AM  Result Value Ref Range   Magnesium 1.7 1.7 - 2.4 mg/dL    Comment: Performed at [facility], [address] [place], [place] [address]  Iron and TIBC     Status: Abnormal   Collection Time: 10/25/22  1:54 AM  Result Value Ref Range   Iron 26 (L) 28 - 170 ug/dL   TIBC 808 (L) 811 - 031 ug/dL   Saturation Ratios 12 10.4 - 31.8 %   UIBC 188 ug/dL    Comment: Performed at [facility], [address], [place], [place] [address]   DG CHEST PORT 1 VIEW  Result Date: 10/25/2022 CLINICAL DATA:  Increased weakness and COVID-19 positivity, initial encounter EXAM: PORTABLE CHEST 1 VIEW COMPARISON:  None Available. FINDINGS: Cardiac shadow is enlarged. Postsurgical changes are noted in the lower thoracic spine. Lungs are clear. No focal abnormality is noted. IMPRESSION: No acute abnormality noted. Electronically Signed   By: Alcide Clever M.D.   On: 10/25/2022 00:12   CT Head Wo Contrast  Result Date: 10/24/2022 CLINICAL DATA:  Mental status change, unknown cause EXAM: CT HEAD WITHOUT  CONTRAST TECHNIQUE: Contiguous axial images were obtained from the base of the skull through the vertex without intravenous contrast. RADIATION DOSE REDUCTION: This exam was performed according to the departmental dose-optimization program which includes automated exposure control, adjustment of the mA and/or kV according to patient size and/or use of iterative reconstruction technique. COMPARISON:  None Available. FINDINGS: Brain: No intracranial hemorrhage, mass effect, or midline shift. Normal for age atrophy with mild periventricular and deep white matter hypodensity typical of chronic small vessel ischemia. No hydrocephalus. The basilar cisterns are patent. No evidence of territorial infarct or acute ischemia. Enlarged partially empty sella. No extra-axial or intracranial fluid collection. Vascular: Atherosclerosis of skullbase vasculature without hyperdense vessel or abnormal calcification. Skull: No acute findings. Frontal hyperostosis. Sinuses/Orbits: No acute findings. Bilateral lens resection. Other: None. IMPRESSION: 1. No acute intracranial abnormality. 2. Mild chronic small vessel ischemia. 3. Enlarged partially empty sella, typically incidental but can be seen in the setting of intracranial hypertension. Electronically Signed   By: Narda Rutherford M.D.   On: 10/24/2022 19:47   DG Knee Complete 4 Views Right  Result  Date: 10/23/2022 CLINICAL DATA:  Fall, right knee pain EXAM: RIGHT KNEE - COMPLETE 4+ VIEW COMPARISON:  None Available. 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 No acute fracture or dislocation. No effusion. Mild diffuse subcutaneous edema. IMPRESSION: 1. Severe tricompartmental degenerative arthritis. Electronically Signed   By: Fidela Salisbury M.D.   On: 10/23/2022 22:22   DG Ankle 2 Views Left  Result Date: 10/23/2022 CLINICAL DATA:  Fall, left ankle pain EXAM: LEFT ANKLE - 2 VIEW COMPARISON:  None FINDINGS: Normal alignment. No acute  fracture or dislocation. Mild tibiotalar degenerative arthritis. No ankle effusion. Diffuse subcutaneous edema with bimalleolar soft tissue swelling IMPRESSION: 1. Soft tissue swelling. No acute fracture or dislocation. Electronically Signed   By: Fidela Salisbury M.D.   On: 10/23/2022 22:22   DG Ankle 2 Views Right  Result Date: 10/23/2022 CLINICAL DATA:  Fall, right ankle pain EXAM: RIGHT ANKLE - 2 VIEW COMPARISON:  None Available. FINDINGS: Normal alignment. No acute fracture or dislocation. Moderate tibiotalar degenerative arthritis. No effusion. Diffuse subcutaneous edema with bimalleolar soft tissue swelling. IMPRESSION: 1. Soft tissue swelling. No acute fracture or dislocation. Electronically Signed   By: Fidela Salisbury M.D.   On: 10/23/2022 22:21   DG Foot 2 Views Right  Result Date: 10/23/2022 CLINICAL DATA:  Trauma EXAM: RIGHT FOOT - 2 VIEW COMPARISON:  None Available. FINDINGS: The bones are osteopenic. There is no evidence of fracture or dislocation. There is no evidence of arthropathy or other focal bone abnormality. Soft tissues are unremarkable. IMPRESSION: 1. No acute fracture or dislocation. 2. Osteopenia. Electronically Signed   By: Ronney Asters M.D.   On: 10/23/2022 22:20   DG Foot 2 Views Left  Result Date: 10/23/2022 CLINICAL DATA:  Fall, left foot pain EXAM: LEFT FOOT - 2 VIEW COMPARISON:  None Available. 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 Otherwise normal alignment. No acute fracture or dislocation. Joint spaces are preserved. Soft tissues are unremarkable. IMPRESSION: 1. 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 Electronically Signed   By: Fidela Salisbury M.D.   On: 10/23/2022 22:20    Pending Labs Unresulted Labs (From admission, onward)     Start     Ordered   10/25/22 0500  CBC with Differential/Platelet  Daily,   R      10/24/22 2350   10/25/22 0500  Comprehensive metabolic panel  Daily,   R      10/24/22 2350   10/24/22 2350  Culture,  blood (Routine X 2) w Reflex to ID Panel  BLOOD CULTURE X 2,   R      10/24/22 2350            Vitals/Pain Today's Vitals   10/24/22 2200 10/24/22 2216 10/25/22 0230 10/25/22 0500  BP: 134/89  (!) 149/88 (!) 143/90  Pulse: 77  76 77  Resp: 17  (!) 29 (!) 31  Temp:      TempSrc:      SpO2: 91%  (!) 81% 93%  PainSc:  0-No pain      Isolation Precautions Airborne and Contact precautions  Medications Medications  albuterol (VENTOLIN HFA) 108 (90 Base) MCG/ACT inhaler 2 puff (has no administration in time range)  ascorbic acid (VITAMIN C) tablet 500 mg (500 mg Oral Given 10/25/22 0959)  zinc sulfate capsule 220 mg (220 mg Oral Given 10/25/22 0958)  enalapril (VASOTEC) tablet 20 mg (20 mg Oral Given 10/25/22 0957)  hydrochlorothiazide (HYDRODIURIL) tablet 50 mg (50 mg Oral Given 10/25/22  1000)  clopidogrel (PLAVIX) tablet 75 mg (75 mg Oral Given 10/25/22 0959)  gabapentin (NEURONTIN) capsule 400 mg (400 mg Oral Given 10/25/22 0957)  atorvastatin (LIPITOR) tablet 10 mg (10 mg Oral Given 10/25/22 0959)  docusate sodium (COLACE) capsule 100 mg (has no administration in time range)  DULoxetine (CYMBALTA) DR capsule 60 mg (60 mg Oral Given 10/25/22 0958)  metoprolol succinate (TOPROL-XL) 24 hr tablet 100 mg (has no administration in time range)  oxyCODONE (OXYCONTIN) 12 hr tablet 30 mg (30 mg Oral Given 10/25/22 0421)  enoxaparin (LOVENOX) injection 40 mg (40 mg Subcutaneous Not Given 10/25/22 1000)  0.9 %  sodium chloride infusion ( Intravenous New Bag/Given 10/25/22 0426)  acetaminophen (TYLENOL) tablet 650 mg (has no administration in time range)    Or  acetaminophen (TYLENOL) suppository 650 mg (has no administration in time range)  cephALEXin (KEFLEX) capsule 500 mg (500 mg Oral Given 10/25/22 1000)  cephALEXin (KEFLEX) capsule 1,000 mg (1,000 mg Oral Given 10/24/22 1743)    Mobility non-ambulatory     Focused Assessments     R Recommendations: See Admitting Provider  Note  Report given to: Amedeo Gory RN   Additional Notes: pt is covid +, gave PO meds except oxycontin do not have dose in ED per Pharmacist and will tube to ED using secure code. Family at bedside. Per MD gonna send to rehab at d/c. Takes meds whole.

## 2022-10-25 NOTE — Assessment & Plan Note (Signed)
Likely secondary to covid-19 Continue to trend Check iron studies

## 2022-10-25 NOTE — Assessment & Plan Note (Signed)
Well controlled  Continue vasotec 20mg  BID Hctz 50mg  daily  Toporol xl 100mg  daily

## 2022-10-25 NOTE — ED Notes (Signed)
MD at bedside and speaking with family

## 2022-10-25 NOTE — Assessment & Plan Note (Signed)
Continue gabapentin TID Continue cymbalta

## 2022-10-25 NOTE — ED Notes (Signed)
ED TO INPATIENT HANDOFF REPORT  ED Nurse Name and Phone #: [phone]  S Name/Age/Gender Kathleen [name] 82 y.o. female Room/Bed: 029C/029C  Code Status   Code Status: Full Code  Home/SNF/Other Home Patient oriented to: self, place, time, and situation Is this baseline? Yes   Triage Complete: Triage complete  Chief Complaint Acute encephalopathy [G93.40]  Triage Note BIBEMS from home. EMS called for unresponsive patient. Upon EMS arrival patient is alert and orientedx4. Family reporting that patient has been having increased weakness, malaise, fever, and warm to touch. Patient having increased incontinence and more difficulty moving then normal. Patient given prescribed oxycodone and tylenol prior to EMS arrival. Patient reporting 10/10 pain in legs   Allergies Allergies Allergen Reactions  Aspirin Nausea And Vomiting   Level of Care/Admitting Diagnosis ED Disposition    ED Disposition Admit  Condition --  Comment Hospital Area: [facility] [[id number]]  Level of Care: Telemetry Medical [104]  May place patient in observation at [facility] or [facility] if equivalent level of care is available:: Yes  Covid Evaluation: Confirmed COVID Positive  Diagnosis: Acute encephalopathy [431540]  Admitting Physician: Orland Mustard [[id number]]  Attending Physician: Orland Mustard [[id number]]       B Medical/Surgery History No past medical history on file. No past surgical history on file.   A IV Location/Drains/Wounds Patient Lines/Drains/Airways Status    Active Line/Drains/Airways    Name Placement date Placement time Site Days  Peripheral IV 10/25/22 20 G Posterior;Right Hand 10/25/22  0030  Hand  less than 1  Peripheral IV 10/25/22 20 G Anterior;Left;Proximal Forearm 10/25/22  0030  Forearm  less than 1        Intake/Output Last 24 hours No intake or output data in the 24 hours ending 10/25/22 0451  Labs/Imaging Results for orders placed  or performed during the hospital encounter of 10/24/22 (from the past 48 hour(s)) CBC     Status: Abnormal  Collection Time: 10/24/22 12:03 PM Result Value Ref Range  WBC 3.6 (L) 4.0 - 10.5 K/uL  RBC 3.68 (L) 3.87 - 5.11 MIL/uL  Hemoglobin 10.9 (L) 12.0 - 15.0 g/dL  HCT 12.4 (L) 58.0 - 99.8 %  MCV 90.2 80.0 - 100.0 fL  MCH 29.6 26.0 - 34.0 pg  MCHC 32.8 30.0 - 36.0 g/dL  RDW 33.8 25.0 - 53.9 %  Platelets 131 (L) 150 - 400 K/uL   Comment: REPEATED TO VERIFY  nRBC 0.0 0.0 - 0.2 %   Comment: Performed at [facility], [address] [place], [place] [address] Comprehensive metabolic panel     Status: Abnormal  Collection Time: 10/24/22 12:03 PM Result Value Ref Range  Sodium 135 135 - 145 mmol/L  Potassium 3.8 3.5 - 5.1 mmol/L  Chloride 99 98 - 111 mmol/L  CO2 28 22 - 32 mmol/L  Glucose, Bld 108 (H) 70 - 99 mg/dL   Comment: Glucose reference range applies only to samples taken after fasting for at least 8 hours.  BUN 15 8 - 23 mg/dL  Creatinine, Ser 1.93 7.90 - 1.00 mg/dL  Calcium 8.6 (L) 8.9 - 10.3 mg/dL  Total Protein 6.9 6.5 - 8.1 g/dL  Albumin 2.8 (L) 3.5 - 5.0 g/dL  AST 34 15 - 41 U/L  ALT 18 0 - 44 U/L  Alkaline Phosphatase 53 38 - 126 U/L  Total Bilirubin 0.7 0.3 - 1.2 mg/dL  GFR, Estimated >24 >09 mL/min   Comment: (NOTE) Calculated using the CKD-EPI Creatinine  Equation (2021)   Anion gap 8 5 - 15   Comment: Performed at [facility], [address] [place], [place] [address] Urinalysis, Routine w reflex microscopic -Urine, Catheterized     Status: None  Collection Time: 10/24/22  2:16 PM Result Value Ref Range  Color, Urine YELLOW YELLOW  APPearance CLEAR CLEAR  Specific Gravity, Urine 1.010 1.005 - 1.030  pH 6.0 5.0 - 8.0  Glucose, UA NEGATIVE NEGATIVE mg/dL  Hgb urine dipstick NEGATIVE NEGATIVE  Bilirubin Urine NEGATIVE NEGATIVE  Ketones, ur NEGATIVE NEGATIVE mg/dL  Protein, ur NEGATIVE NEGATIVE  mg/dL  Nitrite NEGATIVE NEGATIVE  Leukocytes,Ua NEGATIVE NEGATIVE   Comment: Performed at [facility], [address] [place], [place] [address] Resp panel by RT-PCR (RSV, Flu A&B, Covid) Anterior Nasal Swab     Status: Abnormal  Collection Time: 10/24/22  2:55 PM  Specimen: Anterior Nasal Swab Result Value Ref Range  SARS Coronavirus 2 by RT PCR POSITIVE (A) NEGATIVE   Comment: (NOTE) SARS-CoV-2 target nucleic acids are DETECTED.  The SARS-CoV-2 RNA is generally detectable in upper respiratory specimens during the acute phase of infection. Positive results are indicative of the presence of the identified virus, but do not rule out bacterial infection or co-infection with other pathogens not detected by the test. Clinical correlation with patient history and other diagnostic information is necessary to determine patient infection status. The expected result is Negative.  Fact Sheet for Patients: BloggerCourse.com  Fact Sheet for Healthcare Providers: SeriousBroker.it  This test is not yet approved or cleared by the [place] FDA and  has been authorized for detection and/or diagnosis of SARS-CoV-2 by FDA under an Emergency Use Authorization (EUA).  This EUA will remain in effect (meaning this test can be used) for the duration of  the COVID-19 declaration under Section 564(b)(1) of the A ct, 21 U.S.C. section 360bbb-3(b)(1), unless the authorization is terminated or revoked sooner.    Influenza A by PCR NEGATIVE NEGATIVE  Influenza B by PCR NEGATIVE NEGATIVE   Comment: (NOTE) The Xpert Xpress SARS-CoV-2/FLU/RSV plus assay is intended as an aid in the diagnosis of influenza from Nasopharyngeal swab specimens and should not be used as a sole basis for treatment. Nasal washings and aspirates are unacceptable for Xpert Xpress SARS-CoV-2/FLU/RSV testing.  Fact Sheet for  Patients: BloggerCourse.com  Fact Sheet for Healthcare Providers: SeriousBroker.it  This test is not yet approved or cleared by the [place] FDA and has been authorized for detection and/or diagnosis of SARS-CoV-2 by FDA under an Emergency Use Authorization (EUA). This EUA will remain in effect (meaning this test can be used) for the duration of the COVID-19 declaration under Section 564(b)(1) of the Act, 21 U.S.C. section 360bbb-3(b)(1), unless the authorization is terminated or revoked.    Resp Syncytial Virus by PCR NEGATIVE NEGATIVE   Comment: (NOTE) Fact Sheet for Patients: BloggerCourse.com  Fact Sheet for Healthcare Providers: SeriousBroker.it  This test is not yet approved or cleared by the [place] FDA and has been authorized for detection and/or diagnosis of SARS-CoV-2 by FDA under an Emergency Use Authorization (EUA). This EUA will remain in effect (meaning this test can be used) for the duration of the COVID-19 declaration under Section 564(b)(1) of the Act, 21 U.S.C. section 360bbb-3(b)(1), unless the authorization is terminated or revoked.  Performed at [facility], [address], [place], [place] [address]  Vitamin B12     Status: None  Collection Time: 10/25/22 12:31 AM Result  Value Ref Range  Vitamin B-12 873 180 - 914 pg/mL   Comment: (NOTE) This assay is not validated for testing neonatal or myeloproliferative syndrome specimens for Vitamin B12 levels. Performed at [facility], [place] [address], [place], [place] [address]  TSH     Status: Abnormal  Collection Time: 10/25/22 12:31 AM Result Value Ref Range  TSH 0.225 (L) 0.350 - 4.500 uIU/mL   Comment: Performed by a 3rd Generation assay with a functional sensitivity of <=0.01 uIU/mL. Performed at [facility], [place] [address], [place], [place] [address]  Ammonia     Status:  None  Collection Time: 10/25/22 12:31 AM Result Value Ref Range  Ammonia 34 9 - 35 umol/L   Comment: HEMOLYSIS AT THIS LEVEL MAY AFFECT RESULT Performed at [facility], [place] [address], [place], [place] [address]  CBC with Differential/Platelet     Status: Abnormal  Collection Time: 10/25/22 12:31 AM Result Value Ref Range  WBC 3.0 (L) 4.0 - 10.5 K/uL  RBC 3.68 (L) 3.87 - 5.11 MIL/uL  Hemoglobin 11.0 (L) 12.0 - 15.0 g/dL  HCT 34.1 (L) 36.0 - 46.0 %  MCV 92.7 80.0 - 100.0 fL  MCH 29.9 26.0 - 34.0 pg  MCHC 32.3 30.0 - 36.0 g/dL  RDW 15.1 11.5 - 15.5 %  Platelets 135 (L) 150 - 400 K/uL   Comment: REPEATED TO VERIFY  nRBC 0.0 0.0 - 0.2 %  Neutrophils Relative % 57 %  Neutro Abs 1.7 1.7 - 7.7 K/uL  Lymphocytes Relative 19 %  Lymphs Abs 0.6 (L) 0.7 - 4.0 K/uL  Monocytes Relative 24 %  Monocytes Absolute 0.7 0.1 - 1.0 K/uL  Eosinophils Relative 0 %  Eosinophils Absolute 0.0 0.0 - 0.5 K/uL  Basophils Relative 0 %  Basophils Absolute 0.0 0.0 - 0.1 K/uL  Immature Granulocytes 0 %  Abs Immature Granulocytes 0.01 0.00 - 0.07 K/uL   Comment: Performed at [facility], [place] [address], [place], [place] [address] Comprehensive metabolic panel     Status: Abnormal  Collection Time: 10/25/22 12:31 AM Result Value Ref Range  Sodium 135 135 - 145 mmol/L  Potassium 3.8 3.5 - 5.1 mmol/L  Chloride 99 98 - 111 mmol/L  CO2 29 22 - 32 mmol/L  Glucose, Bld 113 (H) 70 - 99 mg/dL   Comment: Glucose reference range applies only to samples taken after fasting for at least 8 hours.  BUN 12 8 - 23 mg/dL  Creatinine, Ser 0.74 0.44 - 1.00 mg/dL  Calcium 8.5 (L) 8.9 - 10.3 mg/dL  Total Protein 7.4 6.5 - 8.1 g/dL  Albumin 2.9 (L) 3.5 - 5.0 g/dL  AST 41 15 - 41 U/L  ALT 19 0 - 44 U/L  Alkaline Phosphatase 57 38 - 126 U/L  Total Bilirubin 0.6 0.3 - 1.2 mg/dL  GFR, Estimated >60 >60 mL/min   Comment: (NOTE) Calculated using the CKD-EPI Creatinine Equation (2021)   Anion gap 7 5 -  15   Comment: Performed at [facility], [place] [address] [place], [place] [address] Troponin I (High Sensitivity)     Status: Abnormal  Collection Time: 10/25/22 12:31 AM Result Value Ref Range  Troponin I (High Sensitivity) 25 (H) <18 ng/L   Comment: (NOTE) Elevated high sensitivity troponin I (hsTnI) values and significant  changes across serial measurements may suggest ACS but many other  chronic and acute conditions are known to elevate hsTnI results.  Refer to the "Links"  section for chest pain algorithms and additional  guidance. Performed at [facility], [place] [address], [place], [place] [address]   DG CHEST PORT 1 VIEW  Result Date: 10/25/2022 CLINICAL DATA:  Increased weakness and COVID-19 positivity, initial encounter EXAM: PORTABLE CHEST 1 VIEW COMPARISON:  None Available. FINDINGS: Cardiac shadow is enlarged. Postsurgical changes are noted in the lower thoracic spine. Lungs are clear. No focal abnormality is noted. IMPRESSION: No acute abnormality noted. Electronically Signed   By: Inez Catalina M.D.   On: 10/25/2022 00:12   CT Head Wo Contrast  Result Date: 10/24/2022 CLINICAL DATA:  Mental status change, unknown cause EXAM: CT HEAD WITHOUT CONTRAST TECHNIQUE: Contiguous axial images were obtained from the base of the skull through the vertex without intravenous contrast. RADIATION DOSE REDUCTION: This exam was performed according to the departmental dose-optimization program which includes automated exposure control, adjustment of the mA and/or kV according to patient size and/or use of iterative reconstruction technique. COMPARISON:  None Available. FINDINGS: Brain: No intracranial hemorrhage, mass effect, or midline shift. Normal for age atrophy with mild periventricular and deep white matter hypodensity typical of chronic small vessel ischemia. No hydrocephalus. The basilar cisterns are patent. No evidence of territorial infarct or acute ischemia. Enlarged partially  empty sella. No extra-axial or intracranial fluid collection. Vascular: Atherosclerosis of skullbase vasculature without hyperdense vessel or abnormal calcification. Skull: No acute findings. Frontal hyperostosis. Sinuses/Orbits: No acute findings. Bilateral lens resection. Other: None. IMPRESSION: 1. No acute intracranial abnormality. 2. Mild chronic small vessel ischemia. 3. Enlarged partially empty sella, typically incidental but can be seen in the setting of intracranial hypertension. Electronically Signed   By: Keith Rake M.D.   On: 10/24/2022 19:47   DG Knee Complete 4 Views Right  Result Date: 10/23/2022 CLINICAL DATA:  Fall, right knee pain EXAM: RIGHT KNEE - COMPLETE 4+ VIEW COMPARISON:  None Available. 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 No acute fracture or dislocation. No effusion. Mild diffuse subcutaneous edema. IMPRESSION: 1. Severe tricompartmental degenerative arthritis. Electronically Signed   By: Fidela Salisbury M.D.   On: 10/23/2022 22:22   DG Ankle 2 Views Left  Result Date: 10/23/2022 CLINICAL DATA:  Fall, left ankle pain EXAM: LEFT ANKLE - 2 VIEW COMPARISON:  None FINDINGS: Normal alignment. No acute fracture or dislocation. Mild tibiotalar degenerative arthritis. No ankle effusion. Diffuse subcutaneous edema with bimalleolar soft tissue swelling IMPRESSION: 1. Soft tissue swelling. No acute fracture or dislocation. Electronically Signed   By: Fidela Salisbury M.D.   On: 10/23/2022 22:22   DG Ankle 2 Views Right  Result Date: 10/23/2022 CLINICAL DATA:  Fall, right ankle pain EXAM: RIGHT ANKLE - 2 VIEW COMPARISON:  None Available. FINDINGS: Normal alignment. No acute fracture or dislocation. Moderate tibiotalar degenerative arthritis. No effusion. Diffuse subcutaneous edema with bimalleolar soft tissue swelling. IMPRESSION: 1. Soft tissue swelling. No acute fracture or dislocation. Electronically Signed   By: Fidela Salisbury M.D.   On:  10/23/2022 22:21   DG Foot 2 Views Right  Result Date: 10/23/2022 CLINICAL DATA:  Trauma EXAM: RIGHT FOOT - 2 VIEW COMPARISON:  None Available. FINDINGS: The bones are osteopenic. There is no evidence of fracture or dislocation. There is no evidence of arthropathy or other focal bone abnormality. Soft tissues are unremarkable. IMPRESSION: 1. No acute fracture or dislocation. 2. Osteopenia. Electronically Signed   By: Ronney Asters M.D.   On: 10/23/2022 22:20   DG Foot 2 Views Left  Result Date: 10/23/2022  CLINICAL DATA:  Fall, left foot pain EXAM: LEFT FOOT - 2 VIEW COMPARISON:  None Available. 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 Otherwise normal alignment. No acute fracture or dislocation. Joint spaces are preserved. Soft tissues are unremarkable. IMPRESSION: 1. 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 Electronically Signed   By: Helyn Numbers M.D.   On: 10/23/2022 22:20    Pending Labs Unresulted Labs (From admission, onward)    Start     Ordered  10/25/22 0500  CBC with Differential/Platelet  Daily,   R     10/24/22 2350  10/25/22 0500  Comprehensive metabolic panel  Daily,   R     10/24/22 2350  10/25/22 0111  Iron and TIBC  Once,   R       10/25/22 0110  10/24/22 2351  Magnesium  Once,   R       10/24/22 2350  10/24/22 2350  Brain natriuretic peptide  Once,   R       10/24/22 2350  10/24/22 2350  C-reactive protein  Once,   R       10/24/22 2350  10/24/22 2350  Ferritin  Once,   R       10/24/22 2350  10/24/22 2350  D-dimer, quantitative  Once,   R       10/24/22 2350  10/24/22 2350  Fibrinogen  Once,   R       10/24/22 2350  10/24/22 2350  Procalcitonin  Once,   R       10/24/22 2350  10/24/22 2350  Culture, blood (Routine X 2) w Reflex to ID Panel  BLOOD CULTURE X 2,   R     10/24/22 2350  10/24/22 2349  CK  Once,   R       10/24/22 2348        Vitals/Pain Today's Vitals  10/24/22 2150 10/24/22 2200 10/24/22  2216 10/25/22 0230 BP:  134/89  (!) 149/88 Pulse:  77  76 Resp:  17  (!) 29 Temp: 98.9 F (37.2 C)    TempSrc: Oral    SpO2:  91%  (!) 81% PainSc:   0-No pain    Isolation Precautions Airborne and Contact precautions  Medications Medications albuterol (VENTOLIN HFA) 108 (90 Base) MCG/ACT inhaler 2 puff (has no administration in time range) ascorbic acid (VITAMIN C) tablet 500 mg (has no administration in time range) zinc sulfate capsule 220 mg (has no administration in time range) oxyCODONE (OXYCONTIN) 12 hr tablet 30 mg (30 mg Oral Given 10/25/22 0421) enoxaparin (LOVENOX) injection 40 mg (has no administration in time range) 0.9 %  sodium chloride infusion ( Intravenous New Bag/Given 10/25/22 0426) acetaminophen (TYLENOL) tablet 650 mg (has no administration in time range)   Or acetaminophen (TYLENOL) suppository 650 mg (has no administration in time range) cephALEXin (KEFLEX) capsule 1,000 mg (1,000 mg Oral Given 10/24/22 1743)   Mobility non-ambulatory     Focused Assessments Neuro Assessment Handoff:  Swallow screen pass?            Neuro Assessment: Within Defined Limits Neuro Checks:      Has TPA been given? No If patient is a Neuro Trauma and patient is going to OR before floor call report to 4N Charge nurse: [phone] or [phone]   R Recommendations: See Admitting Provider Note  Report given to:   Additional Notes:

## 2022-10-25 NOTE — Evaluation (Signed)
Physical Therapy Evaluation Patient Details Name: Kathleen [name] MRN: [id number] DOB: Mar 20, 1941 Today's Date: 10/25/2022  History of Present Illness  Pt is a 82 y.o. F who presents 10/24/2022 with complaints of weakness and confusion. Pt with recent fall 1/25 in which she was in ED. Found to have Covid-19. Significant PMH: HTN, OA, spinal stenosis, s/p thoracic laminectomy and fusion.  Clinical Impression  PTA, pt lives with her daughter, has a caregiver 2x/day, and [facility]. Pt is a household ambulator with a RW and requires assist for ADL's. Since pt fell on Thursday, she has not been able to stand. Pt presents with generalized weakness, impaired sitting balance, and decreased activity tolerance. Pt requiring two person maximal assist for bed mobility. Due to stretcher height, body habitus, and positioning, once sitting up on edge of bed, pt right hip sliding anteriorly. Pt/pt daughter asked for pt to be repositioned back in supine. Will reassess further mobility once pt transitioned to hospital bed. Pt and pt daughter are agreeable to ST SNF to address deficits, decrease caregiver burden, and maximize functional mobility     Recommendations for follow up therapy are one component of a multi-disciplinary discharge planning process, led by the attending physician.  Recommendations may be updated based on patient status, additional functional criteria and insurance authorization.  Follow Up Recommendations Skilled nursing-short term rehab (<3 hours/day) Can patient physically be transported by private vehicle: No    Assistance Recommended at Discharge Frequent or constant Supervision/Assistance  Patient can return home with the following  Two people to help with walking and/or transfers;Two people to help with bathing/dressing/bathroom    Equipment Recommendations Other (comment) (TBA)  Recommendations for Other Services       Functional Status Assessment Patient has had a  recent decline in their functional status and demonstrates the ability to make significant improvements in function in a reasonable and predictable amount of time.     Precautions / Restrictions Precautions Precautions: Fall Restrictions Weight Bearing Restrictions: No      Mobility  Bed Mobility Overal bed mobility: Needs Assistance Bed Mobility: Supine to Sit, Sit to Supine     Supine to sit: Max assist, +2 for physical assistance Sit to supine: Max assist, +2 for physical assistance   General bed mobility comments: MaxA + 2 for supine <> sit from stretcher. Upon sitting up, R hip sliding anteriorly and pt/pt daughter requesting for pt to lie back down    Transfers                        Ambulation/Gait                  Stairs            Wheelchair Mobility    Modified Rankin (Stroke Patients Only)       Balance Overall balance assessment: Needs assistance Sitting-balance support: Feet supported Sitting balance-Leahy Scale: Poor                                       Pertinent Vitals/Pain Pain Assessment Pain Assessment: Faces Faces Pain Scale: Hurts a little bit Pain Location: generalized Pain Descriptors / Indicators: Discomfort Pain Intervention(s): Monitored during session, Limited activity within patient's tolerance    Home Living Family/patient expects to be discharged to:: Private residence Living Arrangements: Children (daughter)   Type of Home: House  Home Access: Ramped entrance       Home Layout: One level Home Equipment: Conservation officer, nature (2 wheels);Wheelchair - manual;BSC/3in1;Hospital bed      Prior Function Prior Level of Function : Needs assist             Mobility Comments: Sleeps in recliner, typically household ambulator. Rides handicapped Lucianne Lei to [facility]. Fall on Thursday and has since been unable to stand ADLs Comments: Caregiver in AM and PM, [facility]ACE during day     Hand Dominance         Extremity/Trunk Assessment   Upper Extremity Assessment Upper Extremity Assessment: RUE deficits/detail;LUE deficits/detail RUE Deficits / Details: Shoulder flexion ROM to ~100 degress, chronic pain LUE Deficits / Details: Shoulder flexion AROM WFL    Lower Extremity Assessment Lower Extremity Assessment: Generalized weakness;RLE deficits/detail;LLE deficits/detail    Cervical / Trunk Assessment Cervical / Trunk Assessment: Other exceptions Cervical / Trunk Exceptions: increased body habitus  Communication   Communication: No difficulties  Cognition Arousal/Alertness: Awake/alert Behavior During Therapy: WFL for tasks assessed/performed Overall Cognitive Status: Impaired/Different from baseline Area of Impairment: Memory, Safety/judgement                     Memory: Decreased short-term memory   Safety/Judgement: Decreased awareness of deficits     General Comments: Decreased insight into deficits, pt daughter answering majority of questions        General Comments      Exercises     Assessment/Plan    PT Assessment Patient needs continued PT services  PT Problem List Decreased strength;Decreased activity tolerance;Decreased balance;Decreased mobility;Obesity       PT Treatment Interventions DME instruction;Gait training;Functional mobility training;Therapeutic activities;Therapeutic exercise;Balance training;Patient/family education    PT Goals (Current goals can be found in the Care Plan section)  Acute Rehab PT Goals Patient Stated Goal: pt daughter agreeable to rehab PT Goal Formulation: With patient/family Time For Goal Achievement: 11/08/22 Potential to Achieve Goals: Fair    Frequency Min 2X/week     Co-evaluation               AM-PAC PT "6 Clicks" Mobility  Outcome Measure Help needed turning from your back to your side while in a flat bed without using bedrails?: A Lot Help needed moving from lying on your back to sitting on the  side of a flat bed without using bedrails?: Total Help needed moving to and from a bed to a chair (including a wheelchair)?: Total Help needed standing up from a chair using your arms (e.g., wheelchair or bedside chair)?: Total Help needed to walk in hospital room?: Total Help needed climbing 3-5 steps with a railing? : Total 6 Click Score: 7    End of Session   Activity Tolerance: Patient tolerated treatment well Patient left: in bed;with call bell/phone within reach;with family/visitor present Nurse Communication: Mobility status PT Visit Diagnosis: Other abnormalities of gait and mobility (R26.89);Muscle weakness (generalized) (M62.81);History of falling (Z91.81)    Time: 7619-5093 PT Time Calculation (min) (ACUTE ONLY): 27 min   Charges:   PT Evaluation $PT Eval Moderate Complexity: 1 Mod PT Treatments $Therapeutic Activity: 8-22 mins        Wyona Almas, PT, DPT Acute Rehabilitation Services Office [phone]   Deno Etienne 10/25/2022, 12:42 PM

## 2022-10-25 NOTE — Assessment & Plan Note (Signed)
Likely secondary to covid 19 and debility Check TSH/B12 UA with no signs of infection  PT/OT

## 2022-10-26 DIAGNOSIS — G9340 Encephalopathy, unspecified: Secondary | ICD-10-CM | POA: Diagnosis not present

## 2022-10-26 LAB — CBC WITH DIFFERENTIAL/PLATELET
Abs Immature Granulocytes: 0 10*3/uL (ref 0.00–0.07)
Basophils Absolute: 0 10*3/uL (ref 0.0–0.1)
Basophils Relative: 1 %
Eosinophils Absolute: 0 10*3/uL (ref 0.0–0.5)
Eosinophils Relative: 1 %
HCT: 35.2 % — ABNORMAL LOW (ref 36.0–46.0)
Hemoglobin: 11.5 g/dL — ABNORMAL LOW (ref 12.0–15.0)
Immature Granulocytes: 0 %
Lymphocytes Relative: 38 %
Lymphs Abs: 1.1 10*3/uL (ref 0.7–4.0)
MCH: 29.4 pg (ref 26.0–34.0)
MCHC: 32.7 g/dL (ref 30.0–36.0)
MCV: 90 fL (ref 80.0–100.0)
Monocytes Absolute: 0.5 10*3/uL (ref 0.1–1.0)
Monocytes Relative: 16 %
Neutro Abs: 1.3 10*3/uL — ABNORMAL LOW (ref 1.7–7.7)
Neutrophils Relative %: 44 %
Platelets: 130 10*3/uL — ABNORMAL LOW (ref 150–400)
RBC: 3.91 MIL/uL (ref 3.87–5.11)
RDW: 14.7 % (ref 11.5–15.5)
WBC: 2.9 10*3/uL — ABNORMAL LOW (ref 4.0–10.5)
nRBC: 0 % (ref 0.0–0.2)

## 2022-10-26 LAB — COMPREHENSIVE METABOLIC PANEL
ALT: 19 U/L (ref 0–44)
AST: 35 U/L (ref 15–41)
Albumin: 2.6 g/dL — ABNORMAL LOW (ref 3.5–5.0)
Alkaline Phosphatase: 56 U/L (ref 38–126)
Anion gap: 5 (ref 5–15)
BUN: 10 mg/dL (ref 8–23)
CO2: 32 mmol/L (ref 22–32)
Calcium: 8.2 mg/dL — ABNORMAL LOW (ref 8.9–10.3)
Chloride: 98 mmol/L (ref 98–111)
Creatinine, Ser: 0.73 mg/dL (ref 0.44–1.00)
GFR, Estimated: >60 mL/min (ref >60)
Glucose, Bld: 108 mg/dL — ABNORMAL HIGH (ref 70–99)
Potassium: 3.5 mmol/L (ref 3.5–5.1)
Sodium: 135 mmol/L (ref 135–145)
Total Bilirubin: 0.5 mg/dL (ref 0.3–1.2)
Total Protein: 6.7 g/dL (ref 6.5–8.1)

## 2022-10-26 NOTE — Progress Notes (Signed)
OT Cancellation Note  Patient Details Name: Kathleen [name] MRN: [id number] DOB: 27-Feb-1941   Cancelled Treatment:    Reason Eval/Treat Not Completed: Other (comment) (Pt's family wishes to be present for OT evaluation; Planning for 11am this date.)  Elliot Cousin 10/26/2022, 7:47 AM

## 2022-10-26 NOTE — [place] FL2 (Signed)
[place] MEDICAID FL2 LEVEL OF CARE FORM     IDENTIFICATION  Patient Name: Kathleen [name] [place] Birthdate: Jan 20, 1941 Sex: female Admission Date (Current Location): 10/24/2022  [facility] and [place] Number:  Herbalist and Address:  [facility] [place], [place] [address] [place], [place] [address]      Provider Number:    Attending Physician Name and Address:  Darliss Cheney, MD  Relative Name and Phone Number:  Kathleen [name] [phone]    Current Level of Care: SNF Recommended Level of Care: [place] Prior Approval Number:    Date Approved/Denied:   PASRR Number: [id number]  Discharge Plan: SNF    Current Diagnoses: Patient Active Problem List   Diagnosis Date Noted   Weakness 10/25/2022   HTN (hypertension) 10/24/2022   Acute encephalopathy in setting of covid 19 10/24/2022   COVID-19 virus infection 10/24/2022   Pancytopenia ([place]) 10/24/2022   Pain due to onychomycosis of toenails of both feet 09/10/2022   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[place]) 11/05/2021   Neuropathic pain 09/23/2017   S/P lumbar fusion 09/23/2017    Orientation RESPIRATION BLADDER Height & Weight     Self, Time, Situation, Place  Normal Continent Weight:   Height:     BEHAVIORAL SYMPTOMS/MOOD NEUROLOGICAL BOWEL NUTRITION STATUS      Continent Diet  AMBULATORY STATUS COMMUNICATION OF NEEDS Skin   Extensive Assist Verbally Normal                       Personal Care Assistance Level of Assistance  Bathing, Feeding, Dressing Bathing Assistance: Maximum assistance Feeding assistance: Independent Dressing Assistance: Maximum assistance     Functional Limitations Info  Sight, Hearing, Speech Sight Info: Adequate Hearing Info: Adequate Speech Info: Adequate    SPECIAL CARE FACTORS FREQUENCY  PT (By licensed PT), OT (By licensed OT)     PT Frequency: 5x per week OT Frequency: 5x per week            Contractures Contractures Info: Not  present    Additional Factors Info  Code Status Code Status Info: full code             Current Medications (10/26/2022):  This is the current hospital active medication list Current Facility-Administered Medications  Medication Dose Route Frequency Provider Last Rate Last Admin   acetaminophen (TYLENOL) tablet 650 mg  650 mg Oral Q6H PRN Orma Flaming, MD   650 mg at 10/25/22 1303   Or   acetaminophen (TYLENOL) suppository 650 mg  650 mg Rectal Q6H PRN Orma Flaming, MD       albuterol (VENTOLIN HFA) 108 (90 Base) MCG/ACT inhaler 2 puff  2 puff Inhalation Q6H PRN Orma Flaming, MD       ascorbic acid (VITAMIN C) tablet 500 mg  500 mg Oral Daily Orma Flaming, MD   500 mg at 10/26/22 1010   atorvastatin (LIPITOR) tablet 10 mg  10 mg Oral Daily Orma Flaming, MD   10 mg at 10/26/22 1010   cephALEXin (KEFLEX) capsule 500 mg  500 mg Oral Q8H Barb Merino, MD   500 mg at 10/26/22 0551   clopidogrel (PLAVIX) tablet 75 mg  75 mg Oral Daily Orma Flaming, MD   75 mg at 10/26/22 1010   docusate sodium (COLACE) capsule 100 mg  100 mg Oral QHS Orma Flaming, MD   100 mg at 10/25/22 2217   DULoxetine (CYMBALTA) DR capsule 60 mg  60 mg Oral  Daily Orma Flaming, MD   60 mg at 10/26/22 1011   enalapril (VASOTEC) tablet 20 mg  20 mg Oral BID Orma Flaming, MD   20 mg at 10/26/22 1010   enoxaparin (LOVENOX) injection 40 mg  40 mg Subcutaneous Q24H Orma Flaming, MD   40 mg at 10/26/22 1009   gabapentin (NEURONTIN) capsule 400 mg  400 mg Oral TID Orma Flaming, MD   400 mg at 10/26/22 1011   hydrochlorothiazide (HYDRODIURIL) tablet 50 mg  50 mg Oral Daily Orma Flaming, MD   50 mg at 10/26/22 1010   metoprolol succinate (TOPROL-XL) 24 hr tablet 100 mg  100 mg Oral Daily Orma Flaming, MD   100 mg at 10/26/22 1010   oxyCODONE (Oxy IR/ROXICODONE) immediate release tablet 10 mg  10 mg Oral Q6H PRN Barb Merino, MD   10 mg at 10/25/22 1239   oxyCODONE (OXYCONTIN) 12 hr tablet 30 mg  30 mg  Oral Q12H Orma Flaming, MD   30 mg at 10/26/22 1011   polyethylene glycol (MIRALAX / GLYCOLAX) packet 17 g  17 g Oral Daily Barb Merino, MD   17 g at 10/26/22 1010   zinc sulfate capsule 220 mg  220 mg Oral Daily Orma Flaming, MD   220 mg at 10/26/22 1010     Discharge Medications: Please see discharge summary for a list of discharge medications.  Relevant Imaging Results:  Relevant Lab Results:   Additional Information soc sec# [id number]   covid postive 10/24/22  Kathleen [name], [place], [place]

## 2022-10-26 NOTE — TOC Initial Note (Signed)
Transition of Care [facility]) - Initial/Assessment Note    Patient Details  Name: Kathleen [name] MRN: [id number] Date of Birth: Dec 21, 1940  Transition of [facility]) CM/SW Contact:    Elliot Gurney [place], [place] Phone Number: 10/26/2022, 2:19 PM  Clinical Narrative:                 This Education officer, museum spoke with patient's son and daughter to discuss SNF recommendation. It was confirmed that patient resides with her daughter, has a caregiver that comes in 2x per day-2 hours in the morning and evening and is also active with [facility]. Patient walks with a walker. SNF recommendation discussed, per patient's family they were happy with [facility] , she has also been to [facility]. They just want to make sure that patient has the appropriate equipment -parallel bars ( 2 bars on either side).Phoebe Perch completed and will be faxed out. Bed offers to be discussed with family once received.  [place]ia Witz, LCSW Transition of Care      Expected Discharge Plan: Skilled Nursing Facility Barriers to Discharge: Continued Medical Work up   Patient Goals and CMS Choice   CMS Medicare.gov Compare Post Acute Care list provided to:: Patient Represenative (must comment) Choice offered to / list presented to : Adult Children      Expected Discharge Plan and Services In-house Referral: Clinical Social Work   Post Acute Care Choice: [place] Living arrangements for the past 2 months: [place]                                      Prior Living Arrangements/Services Living arrangements for the past 2 months: Single Family Home Lives with:: Adult Children          Need for Family Participation in Patient Care: Yes (Comment) Care giver support system in place?: Yes (comment)   Criminal Activity/Legal Involvement Pertinent to Current Situation/Hospitalization: No - Comment as needed  Activities of Daily Living      Permission Sought/Granted                   Emotional Assessment         Alcohol / Substance Use: Not Applicable Psych Involvement: No (comment)  Admission diagnosis:  Thrombocytopenia ([place]) [D69.6] Acute encephalopathy [G93.40] Generalized weakness [R53.1] Cellulitis of leg, right [T51.761] Sprain and strain of right ankle [S93.401A, S96.911A] COVID [U07.1] Patient Active Problem List   Diagnosis Date Noted   Weakness 10/25/2022   HTN (hypertension) 10/24/2022   Acute encephalopathy in setting of covid 19 10/24/2022   COVID-19 virus infection 10/24/2022   Pancytopenia ([place]) 10/24/2022   Pain due to onychomycosis of toenails of both feet 09/10/2022   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[place]) 11/05/2021   Neuropathic pain 09/23/2017   S/P lumbar fusion 09/23/2017   PCP:  Garwin Brothers, MD Pharmacy:   [id number] - [place], [place] [address] [place] [address] Phone: [phone] Fax: [phone]     Social Determinants of Health (SDOH) Social History:   SDOH Interventions:     Readmission Risk Interventions     No data to display

## 2022-10-26 NOTE — Evaluation (Signed)
Occupational Therapy Evaluation Patient Details Name: Kathleen [name] MRN: [id number] DOB: September 30, 1940 Today's Date: 10/26/2022   History of Present Illness Pt is a 82 y.o. F who presents 10/24/2022 with complaints of weakness and confusion. Pt with recent fall 1/25 in which she was in ED. Found to have Covid-19. Significant PMH: HTN, OA, spinal stenosis, s/p thoracic laminectomy and fusion.   Clinical Impression   [name] was evaluated s/p the above admission list, she requires assist for ADLs and mobility at baseline, has a PGA and attends [facility]. Upon evaluation pt was limited by weakness, body habitus, pain, poor activity tolerance and balance. Overall she needed max A+2 for bed mobility and to stand EOB x2 for 5 seconds each time. Due to the deficits listed below, she also requires total A for LB ADLs and up to mod A for UB ADLs. OT to follow acutely. Recommend d/c to SNF      Recommendations for follow up therapy are one component of a multi-disciplinary discharge planning process, led by the attending physician.  Recommendations may be updated based on patient status, additional functional criteria and insurance authorization.   Follow Up Recommendations  Skilled nursing-short term rehab (<3 hours/day)     Assistance Recommended at Discharge Frequent or constant Supervision/Assistance  Patient can return home with the following A lot of help with walking and/or transfers;Two people to help with walking and/or transfers;Two people to help with bathing/dressing/bathroom;A lot of help with bathing/dressing/bathroom;Assistance with cooking/housework;Direct supervision/assist for medications management;Direct supervision/assist for financial management;Assist for transportation;Help with stairs or ramp for entrance    Functional Status Assessment  Patient has had a recent decline in their functional status and demonstrates the ability to make significant improvements in function in a reasonable  and predictable amount of time.  Equipment Recommendations  Other (comment) (defer)       Precautions / Restrictions Precautions Precautions: Fall Restrictions Weight Bearing Restrictions: No      Mobility Bed Mobility Overal bed mobility: Needs Assistance Bed Mobility: Supine to Sit, Sit to Supine     Supine to sit: Max assist, +2 for physical assistance, +2 for safety/equipment Sit to supine: Max assist, +2 for physical assistance, +2 for safety/equipment        Transfers Overall transfer level: Needs assistance Equipment used: 2 person hand held assist Transfers: Sit to/from Stand Sit to Stand: Max assist, +2 physical assistance, +2 safety/equipment           General transfer comment: face to face, 2x witch second tolerance      Balance Overall balance assessment: Needs assistance Sitting-balance support: Feet supported Sitting balance-Leahy Scale: Poor     Standing balance support: Bilateral upper extremity supported, During functional activity Standing balance-Leahy Scale: Poor           ADL either performed or assessed with clinical judgement   ADL Overall ADL's : Needs assistance/impaired Eating/Feeding: Independent;Sitting   Grooming: Set up;Sitting   Upper Body Bathing: Moderate assistance;Sitting   Lower Body Bathing: Total assistance;Bed level   Upper Body Dressing : Moderate assistance;Sitting   Lower Body Dressing: Total assistance;Bed level   Toilet Transfer: Total assistance   Toileting- Clothing Manipulation and Hygiene: Total assistance;Sit to/from stand       Functional mobility during ADLs: Maximal assistance;+2 for physical assistance;+2 for safety/equipment General ADL Comments: large body habitus, weakness, anxious     Vision Baseline Vision/History: 0 No visual deficits Vision Assessment?: No apparent visual deficits     Perception Perception Perception Tested?:  No   Praxis Praxis Praxis tested?: Not tested     Pertinent Vitals/Pain Pain Assessment Pain Assessment: Faces Faces Pain Scale: Hurts little more Pain Location: IV and bilat LEs Pain Descriptors / Indicators: Discomfort Pain Intervention(s): Limited activity within patient's tolerance, Monitored during session     Hand Dominance Right   Extremity/Trunk Assessment Upper Extremity Assessment Upper Extremity Assessment: RUE deficits/detail;LUE deficits/detail RUE Deficits / Details: Shoulder flexion ROM to ~100 degress, chronic pain LUE Deficits / Details: Shoulder flexion AROM WFL   Lower Extremity Assessment Lower Extremity Assessment: Defer to PT evaluation   Cervical / Trunk Assessment Cervical / Trunk Assessment: Other exceptions Cervical / Trunk Exceptions: increased body habitus   Communication Communication Communication: No difficulties   Cognition Arousal/Alertness: Awake/alert Behavior During Therapy: WFL for tasks assessed/performed, Anxious Overall Cognitive Status: Impaired/Different from baseline Area of Impairment: Memory, Safety/judgement                     Memory: Decreased short-term memory   Safety/Judgement: Decreased awareness of deficits     General Comments: slow and delliberate. anxious in anticipation of pain and FOF     General Comments  VSS on RA, son present and supportive    Exercises     Shoulder Instructions      Home Living Family/patient expects to be discharged to:: Private residence Living Arrangements: Children   Type of Home: House Home Access: Ramped entrance     Home Layout: One level         Bathroom Toilet: Handicapped height     Home Equipment: Agricultural consultant (2 wheels);Wheelchair - manual;BSC/3in1;Hospital bed          Prior Functioning/Environment Prior Level of Function : Needs assist             Mobility Comments: Sleeps in recliner, typically household ambulator. Rides handicapped Zenaida Niece to [facility]. Fall on Thursday and has since been  unable to stand ADLs Comments: Caregiver in AM and PM, [facility] during day        OT Problem List: Decreased range of motion;Decreased strength;Decreased activity tolerance;Impaired balance (sitting and/or standing);Decreased knowledge of use of DME or AE;Decreased knowledge of precautions;Obesity      OT Treatment/Interventions: Self-care/ADL training;Therapeutic exercise;Neuromuscular education;Cognitive remediation/compensation;Patient/family education;Balance training    OT Goals(Current goals can be found in the care plan section) Acute Rehab OT Goals Patient Stated Goal: to feel better OT Goal Formulation: With patient Time For Goal Achievement: 11/09/22 Potential to Achieve Goals: Good ADL Goals Pt Will Perform Upper Body Dressing: with set-up;sitting Pt Will Perform Lower Body Dressing: with max assist;sit to/from stand Pt Will Transfer to Toilet: with mod assist;stand pivot transfer;bedside commode Additional ADL Goal #1: Pt will complete bed mobility with mod A as a precursor to ADLs  OT Frequency: Min 2X/week       AM-PAC OT "6 Clicks" Daily Activity     Outcome Measure Help from another person eating meals?: None Help from another person taking care of personal grooming?: A Little Help from another person toileting, which includes using toliet, bedpan, or urinal?: Total Help from another person bathing (including washing, rinsing, drying)?: A Lot Help from another person to put on and taking off regular upper body clothing?: A Lot Help from another person to put on and taking off regular lower body clothing?: Total 6 Click Score: 13   End of Session Nurse Communication: Mobility status (pt needs new bed)  Activity Tolerance: Patient tolerated treatment well Patient left:  in bed;with call bell/phone within reach;with bed alarm set  OT Visit Diagnosis: Unsteadiness on feet (R26.81);Other abnormalities of gait and mobility (R26.89);Muscle weakness (generalized)  (M62.81);Pain                Time: 1761-6073 OT Time Calculation (min): 34 min Charges:  OT General Charges $OT Visit: 1 Visit OT Evaluation $OT Eval Moderate Complexity: 1 Mod OT Treatments $Therapeutic Activity: 8-22 mins   Elliot Cousin 10/26/2022, 2:57 PM

## 2022-10-26 NOTE — Progress Notes (Signed)
PROGRESS NOTE    Kathleen [name]  JSE:[id number] DOB: August 30, 1941 DOA: 10/24/2022 PCP: Eloisa Northern, MD    Brief Narrative:  82 year old with history of hypertension, osteoarthritis, spinal stenosis and ambulatory dysfunction, chronic pain syndrome on oxycodone at home who was brought to the emergency room with weakness and confusion, low-grade fever and profound debility.  She was in the emergency room on 1/25 with leg weakness and fall, skeletal survey was negative and she was discharged home.  When she went home she was found to be more weak and debilitated, temperature 101 so brought back.  In the emergency room hemodynamically stable.  COVID-19 was positive.  She was also found to have a small area of cellulitis on the right medial thigh.  On room air.  Admitted due to profound debility from COVID-19.   Assessment & Plan:   Acute infective encephalopathy secondary to COVID-19 infection: Nonfocal exam.  Adequately improved and back to normal mentation now.  On room air.  No pulmonary symptoms.  Family declined Paxlovid. Continue supportive care.  PT OT as above.  Profound debility, chronic pain syndrome, spinal stenosis, Patient on chronic pain management.  Continue long-acting oxycodone and short acting oxycodone.  Mobilize with PT OT.  She is also on gabapentin and Cymbalta that will be continued.  Evaluated by PT OT and they recommended SNF.  Patient is medically stable, TOC consulted.  Right medial thigh cellulitis: Without any obvious abscess or collections.  Keflex for 5 days.  Essential hypertension: Blood pressure stable on Vasotec, hydrochlorothiazide and Toprol-XL.  Pancytopenia: Stable.  DVT prophylaxis: enoxaparin (LOVENOX) injection 40 mg Start: 10/25/22 1000   Code Status: Full code Family Communication: None at bedside. Disposition Plan: Status is: Observation, will need discharge to SNF.   Consultants:  None  Procedures:  None  Antimicrobials:  Keflex  1/26----   Subjective: Patient seen and examined.  She states that she is feeling well and she has no symptoms.  No respiratory symptoms either.  She is fully alert and oriented.  Objective: Vitals:   10/25/22 1502 10/25/22 1603 10/25/22 2000 10/26/22 0604  BP: 114/86 139/66 137/79 109/60  Pulse: 81 70 81 79  Resp: 19 (!) 23 16 15   Temp: 98.8 F (37.1 C) 98.9 F (37.2 C) 98.9 F (37.2 C)   TempSrc: Oral Oral Oral   SpO2: 94% 100%  91%    Intake/Output Summary (Last 24 hours) at 10/26/2022 10/28/2022 Last data filed at 10/26/2022 0226 Gross per 24 hour  Intake 1382.09 ml  Output 1950 ml  Net -567.91 ml    There were no vitals filed for this visit.  Examination:  General exam: Appears calm and comfortable, morbidly obese Respiratory system: Clear to auscultation. Respiratory effort normal. Cardiovascular system: S1 & S2 heard, RRR. No JVD, murmurs, rubs, gallops or clicks. No pedal edema. Gastrointestinal system: Abdomen is nondistended, soft and nontender. No organomegaly or masses felt. Normal bowel sounds heard. Central nervous system: Alert and oriented. No focal neurological deficits. Extremities: Very mild erythema right medial thigh Psychiatry: Judgement and insight appear normal. Mood & affect appropriate.   Data Reviewed: I have personally reviewed following labs and imaging studies  CBC: Recent Labs  Lab 10/24/22 1203 10/25/22 0031 10/26/22 0341  WBC 3.6* 3.0* 2.9*  NEUTROABS  --  1.7 1.3*  HGB 10.9* 11.0* 11.5*  HCT 33.2* 34.1* 35.2*  MCV 90.2 92.7 90.0  PLT 131* 135* 130*    Basic Metabolic Panel: Recent Labs  Lab 10/24/22 1203  10/25/22 0031 10/25/22 0154 10/26/22 0341  NA 135 135  --  135  K 3.8 3.8  --  3.5  CL 99 99  --  98  CO2 28 29  --  32  GLUCOSE 108* 113*  --  108*  BUN 15 12  --  10  CREATININE 0.71 0.74  --  0.73  CALCIUM 8.6* 8.5*  --  8.2*  MG  --   --  1.7  --     GFR: CrCl cannot be calculated (Unknown ideal weight.). Liver  Function Tests: Recent Labs  Lab 10/24/22 1203 10/25/22 0031 10/26/22 0341  AST 34 41 35  ALT 18 19 19   ALKPHOS 53 57 56  BILITOT 0.7 0.6 0.5  PROT 6.9 7.4 6.7  ALBUMIN 2.8* 2.9* 2.6*    No results for input(s): "LIPASE", "AMYLASE" in the last 168 hours. Recent Labs  Lab 10/25/22 0031  AMMONIA 34    Coagulation Profile: No results for input(s): "INR", "PROTIME" in the last 168 hours. Cardiac Enzymes: Recent Labs  Lab 10/25/22 0154  CKTOTAL 373*    BNP (last 3 results) No results for input(s): "PROBNP" in the last 8760 hours. HbA1C: No results for input(s): "HGBA1C" in the last 72 hours. CBG: No results for input(s): "GLUCAP" in the last 168 hours. Lipid Profile: No results for input(s): "CHOL", "HDL", "LDLCALC", "TRIG", "CHOLHDL", "LDLDIRECT" in the last 72 hours. Thyroid Function Tests: Recent Labs    10/25/22 0031  TSH 0.225*    Anemia Panel: Recent Labs    10/25/22 0031 10/25/22 0154  VITAMINB12 873  --   FERRITIN  --  136  TIBC  --  214*  IRON  --  26*    Sepsis Labs: Recent Labs  Lab 10/25/22 0154  PROCALCITON <0.10     Recent Results (from the past 240 hour(s))  Resp panel by RT-PCR (RSV, Flu A&B, Covid) Anterior Nasal Swab     Status: Abnormal   Collection Time: 10/24/22  2:55 PM   Specimen: Anterior Nasal Swab  Result Value Ref Range Status   SARS Coronavirus 2 by RT PCR POSITIVE (A) NEGATIVE Final    Comment: (NOTE) SARS-CoV-2 target nucleic acids are DETECTED.  The SARS-CoV-2 RNA is generally detectable in upper respiratory specimens during the acute phase of infection. Positive results are indicative of the presence of the identified virus, but do not rule out bacterial infection or co-infection with other pathogens not detected by the test. Clinical correlation with patient history and other diagnostic information is necessary to determine patient infection status. The expected result is Negative.  Fact Sheet for  Patients: EntrepreneurPulse.com.au  Fact Sheet for Healthcare Providers: IncredibleEmployment.be  This test is not yet approved or cleared by the [place] FDA and  has been authorized for detection and/or diagnosis of SARS-CoV-2 by FDA under an Emergency Use Authorization (EUA).  This EUA will remain in effect (meaning this test can be used) for the duration of  the COVID-19 declaration under Section 564(b)(1) of the A ct, 21 U.S.C. section 360bbb-3(b)(1), unless the authorization is terminated or revoked sooner.     Influenza A by PCR NEGATIVE NEGATIVE Final   Influenza B by PCR NEGATIVE NEGATIVE Final    Comment: (NOTE) The Xpert Xpress SARS-CoV-2/FLU/RSV plus assay is intended as an aid in the diagnosis of influenza from Nasopharyngeal swab specimens and should not be used as a sole basis for treatment. Nasal washings and aspirates are unacceptable for Xpert Xpress SARS-CoV-2/FLU/RSV testing.  Fact Sheet for Patients: BloggerCourse.com  Fact Sheet for Healthcare Providers: SeriousBroker.it  This test is not yet approved or cleared by the [place] FDA and has been authorized for detection and/or diagnosis of SARS-CoV-2 by FDA under an Emergency Use Authorization (EUA). This EUA will remain in effect (meaning this test can be used) for the duration of the COVID-19 declaration under Section 564(b)(1) of the Act, 21 U.S.C. section 360bbb-3(b)(1), unless the authorization is terminated or revoked.     Resp Syncytial Virus by PCR NEGATIVE NEGATIVE Final    Comment: (NOTE) Fact Sheet for Patients: BloggerCourse.com  Fact Sheet for Healthcare Providers: SeriousBroker.it  This test is not yet approved or cleared by the [place] FDA and has been authorized for detection and/or diagnosis of SARS-CoV-2 by FDA under an Emergency Use  Authorization (EUA). This EUA will remain in effect (meaning this test can be used) for the duration of the COVID-19 declaration under Section 564(b)(1) of the Act, 21 U.S.C. section 360bbb-3(b)(1), unless the authorization is terminated or revoked.  Performed at [facility], [address] [place], [place] [address]   Culture, blood (Routine X 2) w Reflex to ID Panel     Status: None (Preliminary result)   Collection Time: 10/24/22 11:50 PM   Specimen: BLOOD  Result Value Ref Range Status   Specimen Description BLOOD SITE NOT SPECIFIED  Final   Special Requests   Final    BOTTLES DRAWN AEROBIC AND ANAEROBIC Blood Culture results may not be optimal due to an excessive volume of blood received in culture bottles   Culture   Final    NO GROWTH 1 DAY Performed at [facility] [place] [facility], [address] [place], [place] [address]    Report Status PENDING  Incomplete  Culture, blood (Routine X 2) w Reflex to ID Panel     Status: None (Preliminary result)   Collection Time: 10/24/22 11:55 PM   Specimen: BLOOD  Result Value Ref Range Status   Specimen Description BLOOD SITE NOT SPECIFIED  Final   Special Requests   Final    BOTTLES DRAWN AEROBIC AND ANAEROBIC Blood Culture results may not be optimal due to an excessive volume of blood received in culture bottles   Culture   Final    NO GROWTH 1 DAY Performed at [facility], [address] [place], [place] [address]    Report Status PENDING  Incomplete         Radiology Studies: DG CHEST PORT 1 VIEW  Result Date: 10/25/2022 CLINICAL DATA:  Increased weakness and COVID-19 positivity, initial encounter EXAM: PORTABLE CHEST 1 VIEW COMPARISON:  None Available. FINDINGS: Cardiac shadow is enlarged. Postsurgical changes are noted in the lower thoracic spine. Lungs are clear. No focal abnormality is noted. IMPRESSION: No acute abnormality noted. Electronically Signed   By: Alcide Clever M.D.   On: 10/25/2022 00:12   CT Head Wo  Contrast  Result Date: 10/24/2022 CLINICAL DATA:  Mental status change, unknown cause EXAM: CT HEAD WITHOUT CONTRAST TECHNIQUE: Contiguous axial images were obtained from the base of the skull through the vertex without intravenous contrast. RADIATION DOSE REDUCTION: This exam was performed according to the departmental dose-optimization program which includes automated exposure control, adjustment of the mA and/or kV according to patient size and/or use of iterative reconstruction technique. COMPARISON:  None Available. FINDINGS: Brain: No intracranial hemorrhage, mass effect, or midline shift. Normal for age atrophy with mild periventricular and deep white matter hypodensity  typical of chronic small vessel ischemia. No hydrocephalus. The basilar cisterns are patent. No evidence of territorial infarct or acute ischemia. Enlarged partially empty sella. No extra-axial or intracranial fluid collection. Vascular: Atherosclerosis of skullbase vasculature without hyperdense vessel or abnormal calcification. Skull: No acute findings. Frontal hyperostosis. Sinuses/Orbits: No acute findings. Bilateral lens resection. Other: None. IMPRESSION: 1. No acute intracranial abnormality. 2. Mild chronic small vessel ischemia. 3. Enlarged partially empty sella, typically incidental but can be seen in the setting of intracranial hypertension. Electronically Signed   By: Keith Rake M.D.   On: 10/24/2022 19:47        Scheduled Meds:  vitamin C  500 mg Oral Daily   atorvastatin  10 mg Oral Daily   cephALEXin  500 mg Oral Q8H   clopidogrel  75 mg Oral Daily   docusate sodium  100 mg Oral QHS   DULoxetine  60 mg Oral Daily   enalapril  20 mg Oral BID   enoxaparin (LOVENOX) injection  40 mg Subcutaneous Q24H   gabapentin  400 mg Oral TID   hydrochlorothiazide  50 mg Oral Daily   metoprolol succinate  100 mg Oral Daily   oxyCODONE  30 mg Oral Q12H   polyethylene glycol  17 g Oral Daily   zinc sulfate  220 mg Oral  Daily   Continuous Infusions:   LOS: 0 days    Time spent: 35 minutes    Darliss Cheney, MD [facility] Pager [phone]

## 2022-10-27 DIAGNOSIS — G9340 Encephalopathy, unspecified: Secondary | ICD-10-CM | POA: Diagnosis not present

## 2022-10-27 LAB — CBC WITH DIFFERENTIAL/PLATELET
Abs Immature Granulocytes: 0 10*3/uL (ref 0.00–0.07)
Basophils Absolute: 0 10*3/uL (ref 0.0–0.1)
Basophils Relative: 1 %
Eosinophils Absolute: 0.1 10*3/uL (ref 0.0–0.5)
Eosinophils Relative: 2 %
HCT: 34.5 % — ABNORMAL LOW (ref 36.0–46.0)
Hemoglobin: 11.6 g/dL — ABNORMAL LOW (ref 12.0–15.0)
Immature Granulocytes: 0 %
Lymphocytes Relative: 49 %
Lymphs Abs: 1.5 10*3/uL (ref 0.7–4.0)
MCH: 30.2 pg (ref 26.0–34.0)
MCHC: 33.6 g/dL (ref 30.0–36.0)
MCV: 89.8 fL (ref 80.0–100.0)
Monocytes Absolute: 0.4 10*3/uL (ref 0.1–1.0)
Monocytes Relative: 13 %
Neutro Abs: 1 10*3/uL — ABNORMAL LOW (ref 1.7–7.7)
Neutrophils Relative %: 35 %
Platelets: 125 10*3/uL — ABNORMAL LOW (ref 150–400)
RBC: 3.84 MIL/uL — ABNORMAL LOW (ref 3.87–5.11)
RDW: 14.7 % (ref 11.5–15.5)
WBC: 3 10*3/uL — ABNORMAL LOW (ref 4.0–10.5)
nRBC: 0 % (ref 0.0–0.2)

## 2022-10-27 LAB — COMPREHENSIVE METABOLIC PANEL
ALT: 18 U/L (ref 0–44)
AST: 30 U/L (ref 15–41)
Albumin: 2.6 g/dL — ABNORMAL LOW (ref 3.5–5.0)
Alkaline Phosphatase: 54 U/L (ref 38–126)
Anion gap: 10 (ref 5–15)
BUN: 11 mg/dL (ref 8–23)
CO2: 28 mmol/L (ref 22–32)
Calcium: 8.3 mg/dL — ABNORMAL LOW (ref 8.9–10.3)
Chloride: 97 mmol/L — ABNORMAL LOW (ref 98–111)
Creatinine, Ser: 0.76 mg/dL (ref 0.44–1.00)
GFR, Estimated: >60 mL/min (ref >60)
Glucose, Bld: 91 mg/dL (ref 70–99)
Potassium: 3.8 mmol/L (ref 3.5–5.1)
Sodium: 135 mmol/L (ref 135–145)
Total Bilirubin: 0.4 mg/dL (ref 0.3–1.2)
Total Protein: 6.6 g/dL (ref 6.5–8.1)

## 2022-10-27 NOTE — TOC Progression Note (Addendum)
Transition of Care [facility]) - Progression Note    Patient Details  Name: Kathleen [name] MRN: [id number] Date of Birth: 1941-02-13  Transition of Care [facility]) CM/SW Contact  Joanne Chars, LCSW Phone Number: 10/27/2022, 10:45 AM  Clinical Narrative:   CSW called, LM for [facility] social worker Emmie Niemann.   1110: TC from [employer].  Discussed SNF recommendation and she will inform the team and confirm that they can approve this.    Expected Discharge Plan: [place] Barriers to Discharge: Continued Medical Work up  Expected Discharge Plan and Services In-house Referral: Clinical Social Work   Post Acute Care Choice: [place] Living arrangements for the past 2 months: Single Family Home                                       Social Determinants of Health (SDOH) Interventions    Readmission Risk Interventions     No data to display

## 2022-10-27 NOTE — Progress Notes (Signed)
PROGRESS NOTE    Kathleen [name]  [id number] DOB: 12/21/1940 DOA: 10/24/2022 PCP: Garwin Brothers, MD   Brief Narrative:  This 82 year old female with history of hypertension, osteoarthritis, spinal stenosis and ambulatory dysfunction, chronic pain syndrome on oxycodone at home who was brought to the emergency room with weakness and confusion, low-grade fever and profound debility. She was in the emergency room on 1/25 with leg weakness and fall, skeletal survey was negative and she was discharged home. When she went home she was found to be more weak and debilitated, temperature 101 so brought back. In the emergency room hemodynamically stable. COVID-19 was positive. She was also found to have a small area of cellulitis on the right medial thigh. On room air. Admitted due to profound debility from COVID-19.    Assessment & Plan:   Principal Problem:   Acute encephalopathy in setting of covid 19 Active Problems:   Weakness   Pancytopenia (HCC)   HTN (hypertension)   COVID-19 virus infection   Neuropathic pain   S/P lumbar fusion  Acute Infective encephalopathy secondary to COVID-19 infection: Physical exam : Non focal,   Adequately improved and back to normal mentation now.   She remains on room air.  No pulmonary symptoms.  Family declined Paxlovid. Continue supportive care.  PT OT as above.   Profound debility, chronic pain syndrome, spinal stenosis: Patient on chronic pain management.  Continue long-acting oxycodone and short acting oxycodone. Mobilize with PT OT.  Continue on gabapentin and Cymbalta   Evaluated by PT OT and they recommended SNF.  Patient is medically stable, TOC consulted.   Right medial thigh cellulitis:  There is no evidence of obvious abscess or collections.  Continue Keflex for 5 days.   Essential hypertension: Blood pressure stable on Vasotec, hydrochlorothiazide and Toprol-XL.   Pancytopenia: Stable. Outpatient follow up.  DVT prophylaxis:  Lovenox Code Status:Full code. Family Communication: No family at bed side. Disposition Plan:   Status is: Observation The patient remains OBS appropriate and will d/c before 2 midnights.   Awaiting SNF placement , insurance authorization.   Consultants:  None  Procedures: None  Antimicrobials: Keflex  Subjective: Patient was seen and examined at bedside.  Overnight events noted.   Patient reported doing much better.  She still reports feeling weak and tired and fatigued.  Objective: Vitals:   10/26/22 1010 10/26/22 1519 10/26/22 2000 10/27/22 0744  BP: 125/70 111/60  122/64  Pulse: 86 74 65 71  Resp:  14 (!) 24 16  Temp:  98.7 F (37.1 C)  98.2 F (36.8 C)  TempSrc:  Oral  Oral  SpO2:  98% 98% 95%    Intake/Output Summary (Last 24 hours) at 10/27/2022 1305 Last data filed at 10/27/2022 1000 Gross per 24 hour  Intake 480 ml  Output --  Net 480 ml   There were no vitals filed for this visit.  Examination:  General exam: Appears calm and comfortable, not in any acute distress. Respiratory system: Clear to auscultation. Respiratory effort normal.  RR 15. Cardiovascular system: S1 & S2 heard, RRR. No JVD, murmurs, rubs, gallops or clicks. No pedal edema. Gastrointestinal system: Abdomen is soft, non tender, non distended, BS+ Central nervous system: Alert and oriented x 3. No focal neurological deficits. Extremities: No edema, no cyanosis, no clubbing Skin: No rashes, lesions or ulcers Psychiatry: Judgement and insight appear normal. Mood & affect appropriate.     Data Reviewed: I have personally reviewed following labs and imaging studies  CBC:  Recent Labs  Lab 10/24/22 1203 10/25/22 0031 10/26/22 0341 10/27/22 0345  WBC 3.6* 3.0* 2.9* 3.0*  NEUTROABS  --  1.7 1.3* 1.0*  HGB 10.9* 11.0* 11.5* 11.6*  HCT 33.2* 34.1* 35.2* 34.5*  MCV 90.2 92.7 90.0 89.8  PLT 131* 135* 130* 125*   Basic Metabolic Panel: Recent Labs  Lab 10/24/22 1203 10/25/22 0031  10/25/22 0154 10/26/22 0341 10/27/22 0345  NA 135 135  --  135 135  K 3.8 3.8  --  3.5 3.8  CL 99 99  --  98 97*  CO2 28 29  --  32 28  GLUCOSE 108* 113*  --  108* 91  BUN 15 12  --  10 11  CREATININE 0.71 0.74  --  0.73 0.76  CALCIUM 8.6* 8.5*  --  8.2* 8.3*  MG  --   --  1.7  --   --    GFR: CrCl cannot be calculated (Unknown ideal weight.). Liver Function Tests: Recent Labs  Lab 10/24/22 1203 10/25/22 0031 10/26/22 0341 10/27/22 0345  AST 34 41 35 30  ALT 18 19 19 18   ALKPHOS 53 57 56 54  BILITOT 0.7 0.6 0.5 0.4  PROT 6.9 7.4 6.7 6.6  ALBUMIN 2.8* 2.9* 2.6* 2.6*   No results for input(s): "LIPASE", "AMYLASE" in the last 168 hours. Recent Labs  Lab 10/25/22 0031  AMMONIA 34   Coagulation Profile: No results for input(s): "INR", "PROTIME" in the last 168 hours. Cardiac Enzymes: Recent Labs  Lab 10/25/22 0154  CKTOTAL 373*   BNP (last 3 results) No results for input(s): "PROBNP" in the last 8760 hours. HbA1C: No results for input(s): "HGBA1C" in the last 72 hours. CBG: No results for input(s): "GLUCAP" in the last 168 hours. Lipid Profile: No results for input(s): "CHOL", "HDL", "LDLCALC", "TRIG", "CHOLHDL", "LDLDIRECT" in the last 72 hours. Thyroid Function Tests: Recent Labs    10/25/22 0031  TSH 0.225*   Anemia Panel: Recent Labs    10/25/22 0031 10/25/22 0154  VITAMINB12 873  --   FERRITIN  --  136  TIBC  --  214*  IRON  --  26*   Sepsis Labs: Recent Labs  Lab 10/25/22 0154  PROCALCITON <0.10    Recent Results (from the past 240 hour(s))  Resp panel by RT-PCR (RSV, Flu A&B, Covid) Anterior Nasal Swab     Status: Abnormal   Collection Time: 10/24/22  2:55 PM   Specimen: Anterior Nasal Swab  Result Value Ref Range Status   SARS Coronavirus 2 by RT PCR POSITIVE (A) NEGATIVE Final    Comment: (NOTE) SARS-CoV-2 target nucleic acids are DETECTED.  The SARS-CoV-2 RNA is generally detectable in upper respiratory specimens during the  acute phase of infection. Positive results are indicative of the presence of the identified virus, but do not rule out bacterial infection or co-infection with other pathogens not detected by the test. Clinical correlation with patient history and other diagnostic information is necessary to determine patient infection status. The expected result is Negative.  Fact Sheet for Patients: 10/26/22  Fact Sheet for Healthcare Providers: BloggerCourse.com  This test is not yet approved or cleared by the [place] FDA and  has been authorized for detection and/or diagnosis of SARS-CoV-2 by FDA under an Emergency Use Authorization (EUA).  This EUA will remain in effect (meaning this test can be used) for the duration of  the COVID-19 declaration under Section 564(b)(1) of the A ct, 21 U.S.C. section 360bbb-3(b)(1),  unless the authorization is terminated or revoked sooner.     Influenza A by PCR NEGATIVE NEGATIVE Final   Influenza B by PCR NEGATIVE NEGATIVE Final    Comment: (NOTE) The Xpert Xpress SARS-CoV-2/FLU/RSV plus assay is intended as an aid in the diagnosis of influenza from Nasopharyngeal swab specimens and should not be used as a sole basis for treatment. Nasal washings and aspirates are unacceptable for Xpert Xpress SARS-CoV-2/FLU/RSV testing.  Fact Sheet for Patients: EntrepreneurPulse.com.au  Fact Sheet for Healthcare Providers: IncredibleEmployment.be  This test is not yet approved or cleared by the [place] FDA and has been authorized for detection and/or diagnosis of SARS-CoV-2 by FDA under an Emergency Use Authorization (EUA). This EUA will remain in effect (meaning this test can be used) for the duration of the COVID-19 declaration under Section 564(b)(1) of the Act, 21 U.S.C. section 360bbb-3(b)(1), unless the authorization is terminated or revoked.     Resp  Syncytial Virus by PCR NEGATIVE NEGATIVE Final    Comment: (NOTE) Fact Sheet for Patients: EntrepreneurPulse.com.au  Fact Sheet for Healthcare Providers: IncredibleEmployment.be  This test is not yet approved or cleared by the [place] FDA and has been authorized for detection and/or diagnosis of SARS-CoV-2 by FDA under an Emergency Use Authorization (EUA). This EUA will remain in effect (meaning this test can be used) for the duration of the COVID-19 declaration under Section 564(b)(1) of the Act, 21 U.S.C. section 360bbb-3(b)(1), unless the authorization is terminated or revoked.  Performed at [facility], [place] [address], [place], [place] [address]   Culture, blood (Routine X 2) w Reflex to ID Panel     Status: None (Preliminary result)   Collection Time: 10/24/22 11:50 PM   Specimen: BLOOD  Result Value Ref Range Status   Specimen Description BLOOD SITE NOT SPECIFIED  Final   Special Requests   Final    BOTTLES DRAWN AEROBIC AND ANAEROBIC Blood Culture results may not be optimal due to an excessive volume of blood received in culture bottles   Culture   Final    NO GROWTH 2 DAYS Performed at [facility], [place] [address] [place], [place] [address]    Report Status PENDING  Incomplete  Culture, blood (Routine X 2) w Reflex to ID Panel     Status: None (Preliminary result)   Collection Time: 10/24/22 11:55 PM   Specimen: BLOOD  Result Value Ref Range Status   Specimen Description BLOOD SITE NOT SPECIFIED  Final   Special Requests   Final    BOTTLES DRAWN AEROBIC AND ANAEROBIC Blood Culture results may not be optimal due to an excessive volume of blood received in culture bottles   Culture   Final    NO GROWTH 2 DAYS Performed at [facility], [place] [address], [place], [place] [address]    Report Status PENDING  Incomplete   Radiology Studies: No results found.  Scheduled Meds:  vitamin C  500 mg Oral Daily    atorvastatin  10 mg Oral Daily   clopidogrel  75 mg Oral Daily   docusate sodium  100 mg Oral QHS   DULoxetine  60 mg Oral Daily   enalapril  20 mg Oral BID   enoxaparin (LOVENOX) injection  40 mg Subcutaneous Q24H   gabapentin  400 mg Oral TID   hydrochlorothiazide  50 mg Oral Daily   metoprolol succinate  100 mg Oral Daily   oxyCODONE  30 mg Oral Q12H  polyethylene glycol  17 g Oral Daily   zinc sulfate  220 mg Oral Daily   Continuous Infusions:   LOS: 0 days    Time spent: 50 mins    Saachi Zale, MD [facility]   If 7PM-7AM, please contact night-coverage

## 2022-10-28 DIAGNOSIS — G9340 Encephalopathy, unspecified: Secondary | ICD-10-CM | POA: Diagnosis not present

## 2022-10-28 LAB — CBC WITH DIFFERENTIAL/PLATELET
Abs Immature Granulocytes: 0.01 10*3/uL (ref 0.00–0.07)
Basophils Absolute: 0 10*3/uL (ref 0.0–0.1)
Basophils Relative: 1 %
Eosinophils Absolute: 0.1 10*3/uL (ref 0.0–0.5)
Eosinophils Relative: 3 %
HCT: 35.2 % — ABNORMAL LOW (ref 36.0–46.0)
Hemoglobin: 11.6 g/dL — ABNORMAL LOW (ref 12.0–15.0)
Immature Granulocytes: 0 %
Lymphocytes Relative: 48 %
Lymphs Abs: 1.8 10*3/uL (ref 0.7–4.0)
MCH: 29.8 pg (ref 26.0–34.0)
MCHC: 33 g/dL (ref 30.0–36.0)
MCV: 90.5 fL (ref 80.0–100.0)
Monocytes Absolute: 0.5 10*3/uL (ref 0.1–1.0)
Monocytes Relative: 12 %
Neutro Abs: 1.4 10*3/uL — ABNORMAL LOW (ref 1.7–7.7)
Neutrophils Relative %: 36 %
Platelets: 136 10*3/uL — ABNORMAL LOW (ref 150–400)
RBC: 3.89 MIL/uL (ref 3.87–5.11)
RDW: 14.5 % (ref 11.5–15.5)
WBC: 3.8 10*3/uL — ABNORMAL LOW (ref 4.0–10.5)
nRBC: 0 % (ref 0.0–0.2)

## 2022-10-28 LAB — COMPREHENSIVE METABOLIC PANEL
ALT: 19 U/L (ref 0–44)
AST: 33 U/L (ref 15–41)
Albumin: 2.5 g/dL — ABNORMAL LOW (ref 3.5–5.0)
Alkaline Phosphatase: 61 U/L (ref 38–126)
Anion gap: 7 (ref 5–15)
BUN: 14 mg/dL (ref 8–23)
CO2: 32 mmol/L (ref 22–32)
Calcium: 8.6 mg/dL — ABNORMAL LOW (ref 8.9–10.3)
Chloride: 97 mmol/L — ABNORMAL LOW (ref 98–111)
Creatinine, Ser: 0.75 mg/dL (ref 0.44–1.00)
GFR, Estimated: >60 mL/min (ref >60)
Glucose, Bld: 100 mg/dL — ABNORMAL HIGH (ref 70–99)
Potassium: 3.8 mmol/L (ref 3.5–5.1)
Sodium: 136 mmol/L (ref 135–145)
Total Bilirubin: 0.5 mg/dL (ref 0.3–1.2)
Total Protein: 6.8 g/dL (ref 6.5–8.1)

## 2022-10-28 NOTE — Progress Notes (Signed)
Physical Therapy Treatment Patient Details Name: Kathleen [name] MRN: [id number] DOB: [birth year] Today's Date: 10/28/2022   History of Present Illness Pt is a 82 y.o. F who presents 10/24/2022 with complaints of weakness and confusion. Pt with recent fall 1/25 in which she was in ED. Found to have Covid-19. Significant PMH: HTN, OA, spinal stenosis, s/p thoracic laminectomy and fusion.    PT Comments    Pt received in supine, agreeable to therapy session, pt daughter Odette Fraction present and encouraging, receptive to caregiver instruction. Pt with good effort and tolerance for seated and standing balance and strengthening activities. Pt bed noted to be wet from purewick/incontinence and linens were changed druing session, reviewed with pt/RN pt may need more frequent skin checks and barrier ointment to prevent maceration related skin breakdown. Pt able to stand from elevated EOB with up to +2 maxA and perform a few pre-gait sidesteps/shuffled steps toward HOB. Pt SpO2 and HR WFL throughout on RA. Pt would benefit from maxi sky lift OOB to chair daily (pt needing BM during session so defer at time of session). Pt continues to benefit from PT services to progress toward functional mobility goals.   Recommendations for follow up therapy are one component of a multi-disciplinary discharge planning process, led by the attending physician.  Recommendations may be updated based on patient status, additional functional criteria and insurance authorization.  Follow Up Recommendations  Skilled nursing-short term rehab (<3 hours/day) Can patient physically be transported by private vehicle: No   Assistance Recommended at Discharge Frequent or constant Supervision/Assistance  Patient can return home with the following Two people to help with walking and/or transfers;Two people to help with bathing/dressing/bathroom;Help with stairs or ramp for entrance;Assist for transportation;Assistance with cooking/housework    Equipment Recommendations  Other (comment) (TBD)    Recommendations for Other Services       Precautions / Restrictions Precautions Precautions: Fall Precaution Comments: Airborne/Contact precs Restrictions Weight Bearing Restrictions: No     Mobility  Bed Mobility Overal bed mobility: Needs Assistance Bed Mobility: Supine to Sit, Sit to Supine     Supine to sit: +2 for physical assistance, Mod assist, HOB elevated Sit to supine: Max assist, +2 for physical assistance, +2 for safety/equipment   General bed mobility comments: to R EOB, increased assist to lift BLE over EOB returning to supine    Transfers Overall transfer level: Needs assistance Equipment used: Rolling walker (2 wheels) Transfers: Sit to/from Stand Sit to Stand: From elevated surface, Max assist, +2 physical assistance           General transfer comment: elevated bed>RW, pt needed 2 attempts to reach full upright posture and use of momentum strategy.    Ambulation/Gait Ambulation/Gait assistance: Mod assist, +2 physical assistance   Assistive device: Rolling walker (2 wheels) Gait Pattern/deviations: Step-to pattern, Shuffle     Pre-gait activities: sidesteps along EOB x3 toward her R side, pt with limited to no B foot clearance and needed modA for weight shifting assist to offload, pt shuffled feet toward her R side     Stairs             Wheelchair Mobility    Modified Rankin (Stroke Patients Only)       Balance Overall balance assessment: Needs assistance Sitting-balance support: Feet supported Sitting balance-Leahy Scale: Poor     Standing balance support: Bilateral upper extremity supported, During functional activity Standing balance-Leahy Scale: Poor Standing balance comment: reliant on RW and +2 external assist for safety  Cognition Arousal/Alertness: Awake/alert Behavior During Therapy: WFL for tasks assessed/performed,  Anxious Overall Cognitive Status: Impaired/Different from baseline Area of Impairment: Memory, Safety/judgement                     Memory: Decreased short-term memory   Safety/Judgement: Decreased awareness of deficits     General Comments: slow and delliberate. anxious in anticipation of pain and FOF        Exercises Other Exercises Other Exercises: supine BLE AROM: ankle pumps, heel slides x 10 reps ea Other Exercises: supine BUE AROM: cross-body reaching and pulling (to opposite rail and pushing away for B elbow strengthening) x5 reps ea direction Other Exercises: seated BUE AROM: anterior and lateral reaching with trunk lean and cues for core activation x5 reps ea direction    General Comments General comments (skin integrity, edema, etc.): No acute s/sx distress other than discomfort in peri area, reivewed skin protection with notifying staff when bed wet/making sure she stays clean and dry and use of barrier ointment PRN to prevent maceration, RN notified pt may need more thorough bed bath and skin checks due to purewick/moisture; wet bed linens changed while pt standing EOB during session. Pt needed to have BM but then was unable, RN notified in case she needs bed pan again shortly      Pertinent Vitals/Pain Pain Assessment Pain Assessment: Faces Faces Pain Scale: Hurts little more Pain Location: bilat LEs with knee flexion and weight bearing Pain Descriptors / Indicators: Discomfort     PT Goals (current goals can now be found in the care plan section) Acute Rehab PT Goals Patient Stated Goal: pt daughter agreeable to rehab PT Goal Formulation: With patient/family Time For Goal Achievement: 11/08/22 Progress towards PT goals: Progressing toward goals    Frequency    Min 2X/week      PT Plan Current plan remains appropriate       AM-PAC PT "6 Clicks" Mobility   Outcome Measure  Help needed turning from your back to your side while in a flat bed  without using bedrails?: A Lot Help needed moving from lying on your back to sitting on the side of a flat bed without using bedrails?: Total (+2 maxA with no rails/flat bed) Help needed moving to and from a bed to a chair (including a wheelchair)?: Total Help needed standing up from a chair using your arms (e.g., wheelchair or bedside chair)?: Total Help needed to walk in hospital room?: Total Help needed climbing 3-5 steps with a railing? : Total 6 Click Score: 7    End of Session Equipment Utilized During Treatment: Gait belt Activity Tolerance: Patient tolerated treatment well Patient left: in bed;with call bell/phone within reach;with bed alarm set;with family/visitor present Nurse Communication: Mobility status;Need for lift equipment;Other (comment) (pt sheets changed but she needs a new purewick and more thorough bed bath and barrier ointment; also would benefit from maxi sky lift OOB to chair after bed bath/BM complete) PT Visit Diagnosis: Other abnormalities of gait and mobility (R26.89);Muscle weakness (generalized) (M62.81);History of falling (Z91.81)     Time: 5009-3818 PT Time Calculation (min) (ACUTE ONLY): 35 min  Charges:  $Therapeutic Exercise: 8-22 mins $Therapeutic Activity: 8-22 mins                     Lakechia Nay P., PTA Acute Rehabilitation Services Secure Chat Preferred 9a-5:30pm Office: [place] 10/28/2022, 4:57 PM

## 2022-10-28 NOTE — Progress Notes (Signed)
PROGRESS NOTE    Kathleen [name]  QMG:[id number] DOB: 11/24/1940 DOA: 10/24/2022 PCP: Garwin Brothers, MD   Brief Narrative:  This 82 year old female with history of hypertension, osteoarthritis, spinal stenosis and ambulatory dysfunction, chronic pain syndrome on oxycodone at home who was brought to the emergency room with weakness and confusion, low-grade fever and profound debility. She was in the emergency room on 1/25 with leg weakness and fall, skeletal survey was negative and she was discharged home. When she went home she was found to be more weak and debilitated, temperature 101 so brought back. In the emergency room hemodynamically stable. COVID-19 was positive. She was also found to have a small area of cellulitis on the right medial thigh. On room air. Admitted due to profound debility from COVID-19.  Medically clear for discharge to SNF pending 10 day quarantine for SNF.   Assessment & Plan:   Principal Problem:   Acute encephalopathy in setting of covid 19 Active Problems:   Weakness   Pancytopenia (HCC)   HTN (hypertension)   COVID-19 virus infection   Neuropathic pain   S/P lumbar fusion  Acute Infective encephalopathy secondary to COVID-19 infection: Physical exam : Non focal,   Adequately improved and back to normal mentation now.   She remains on room air.  No pulmonary symptoms.  Family declined Paxlovid. Continue supportive care.  PT OT  recommended SNF   Profound debility, chronic pain syndrome, spinal stenosis: Patient on chronic pain management.  Continue long-acting oxycodone and short acting oxycodone. Mobilize with PT OT.  Continue on gabapentin and Cymbalta   Evaluated by PT OT and they recommended SNF.  Patient is medically stable, TOC consulted.   Right medial thigh cellulitis:  There is no evidence of obvious abscess or collections.  Continue Keflex for 5 days.   Essential hypertension: Blood pressure stable on Vasotec, hydrochlorothiazide and Toprol-XL.    Pancytopenia: Stable. Outpatient follow up.  DVT prophylaxis: Lovenox Code Status:Full code. Family Communication: No family at bed side. Disposition Plan:  Status is: Observation The patient remains OBS appropriate and will d/c before 2 midnights.  Patient is medically clear but she needs to complete 10-day quarantine after COVID diagnosis to be discharged to SNF. COVID diagnosis 10/24/22, likely discharge to SNF on 11/04/2022.   Consultants:  None  Procedures: None  Antimicrobials: Keflex  Subjective: Patient was seen and examined at bedside. Overnight events noted.   Patient reports doing much improved. She  still reports feeling weak and tired and fatigued.  Objective: Vitals:   10/27/22 0744 10/27/22 1915 10/27/22 2140 10/28/22 0813  BP: 122/64 100/60 105/64 108/77  Pulse: 71 65 63 67  Resp: 16 13 12 18   Temp: 98.2 F (36.8 C)   98.3 F (36.8 C)  TempSrc: Oral     SpO2: 95% 97% 94% 100%    Intake/Output Summary (Last 24 hours) at 10/28/2022 1147 Last data filed at 10/28/2022 0820 Gross per 24 hour  Intake 480 ml  Output 2000 ml  Net -1520 ml   There were no vitals filed for this visit.  Examination:  General exam: Appears comfortable, not in any acute distress. Respiratory system: Clear to auscultation. Respiratory effort normal.  RR 15. Cardiovascular system: S1 & S2 heard, regular rate and rhythm, no murmur. Gastrointestinal system: Abdomen is soft, non tender, non distended, BS+ Central nervous system: Alert and oriented x 3. No focal neurological deficits. Extremities: No edema, no cyanosis, no clubbing Skin: No rashes, lesions or ulcers Psychiatry: Judgement  and insight appear normal. Mood & affect appropriate.     Data Reviewed: I have personally reviewed following labs and imaging studies  CBC: Recent Labs  Lab 10/24/22 1203 10/25/22 0031 10/26/22 0341 10/27/22 0345 10/28/22 0507  WBC 3.6* 3.0* 2.9* 3.0* 3.8*  NEUTROABS  --  1.7 1.3*  1.0* 1.4*  HGB 10.9* 11.0* 11.5* 11.6* 11.6*  HCT 33.2* 34.1* 35.2* 34.5* 35.2*  MCV 90.2 92.7 90.0 89.8 90.5  PLT 131* 135* 130* 125* 136*   Basic Metabolic Panel: Recent Labs  Lab 10/24/22 1203 10/25/22 0031 10/25/22 0154 10/26/22 0341 10/27/22 0345 10/28/22 0507  NA 135 135  --  135 135 136  K 3.8 3.8  --  3.5 3.8 3.8  CL 99 99  --  98 97* 97*  CO2 28 29  --  32 28 32  GLUCOSE 108* 113*  --  108* 91 100*  BUN 15 12  --  10 11 14   CREATININE 0.71 0.74  --  0.73 0.76 0.75  CALCIUM 8.6* 8.5*  --  8.2* 8.3* 8.6*  MG  --   --  1.7  --   --   --    GFR: CrCl cannot be calculated (Unknown ideal weight.). Liver Function Tests: Recent Labs  Lab 10/24/22 1203 10/25/22 0031 10/26/22 0341 10/27/22 0345 10/28/22 0507  AST 34 41 35 30 33  ALT 18 19 19 18 19   ALKPHOS 53 57 56 54 61  BILITOT 0.7 0.6 0.5 0.4 0.5  PROT 6.9 7.4 6.7 6.6 6.8  ALBUMIN 2.8* 2.9* 2.6* 2.6* 2.5*   No results for input(s): "LIPASE", "AMYLASE" in the last 168 hours. Recent Labs  Lab 10/25/22 0031  AMMONIA 34   Coagulation Profile: No results for input(s): "INR", "PROTIME" in the last 168 hours. Cardiac Enzymes: Recent Labs  Lab 10/25/22 0154  CKTOTAL 373*   BNP (last 3 results) No results for input(s): "PROBNP" in the last 8760 hours. HbA1C: No results for input(s): "HGBA1C" in the last 72 hours. CBG: No results for input(s): "GLUCAP" in the last 168 hours. Lipid Profile: No results for input(s): "CHOL", "HDL", "LDLCALC", "TRIG", "CHOLHDL", "LDLDIRECT" in the last 72 hours. Thyroid Function Tests: No results for input(s): "TSH", "T4TOTAL", "FREET4", "T3FREE", "THYROIDAB" in the last 72 hours.  Anemia Panel: No results for input(s): "VITAMINB12", "FOLATE", "FERRITIN", "TIBC", "IRON", "RETICCTPCT" in the last 72 hours.  Sepsis Labs: Recent Labs  Lab 10/25/22 0154  PROCALCITON <0.10    Recent Results (from the past 240 hour(s))  Resp panel by RT-PCR (RSV, Flu A&B, Covid) Anterior  Nasal Swab     Status: Abnormal   Collection Time: 10/24/22  2:55 PM   Specimen: Anterior Nasal Swab  Result Value Ref Range Status   SARS Coronavirus 2 by RT PCR POSITIVE (A) NEGATIVE Final    Comment: (NOTE) SARS-CoV-2 target nucleic acids are DETECTED.  The SARS-CoV-2 RNA is generally detectable in upper respiratory specimens during the acute phase of infection. Positive results are indicative of the presence of the identified virus, but do not rule out bacterial infection or co-infection with other pathogens not detected by the test. Clinical correlation with patient history and other diagnostic information is necessary to determine patient infection status. The expected result is Negative.  Fact Sheet for Patients: 10/27/22  Fact Sheet for Healthcare Providers: 10/26/22  This test is not yet approved or cleared by the [employer] FDA and  has been authorized for detection and/or diagnosis of SARS-CoV-2 by FDA  under an Emergency Use Authorization (EUA).  This EUA will remain in effect (meaning this test can be used) for the duration of  the COVID-19 declaration under Section 564(b)(1) of the A ct, 21 U.S.C. section 360bbb-3(b)(1), unless the authorization is terminated or revoked sooner.     Influenza A by PCR NEGATIVE NEGATIVE Final   Influenza B by PCR NEGATIVE NEGATIVE Final    Comment: (NOTE) The Xpert Xpress SARS-CoV-2/FLU/RSV plus assay is intended as an aid in the diagnosis of influenza from Nasopharyngeal swab specimens and should not be used as a sole basis for treatment. Nasal washings and aspirates are unacceptable for Xpert Xpress SARS-CoV-2/FLU/RSV testing.  Fact Sheet for Patients: EntrepreneurPulse.com.au  Fact Sheet for Healthcare Providers: IncredibleEmployment.be  This test is not yet approved or cleared by the [place] FDA and has been  authorized for detection and/or diagnosis of SARS-CoV-2 by FDA under an Emergency Use Authorization (EUA). This EUA will remain in effect (meaning this test can be used) for the duration of the COVID-19 declaration under Section 564(b)(1) of the Act, 21 U.S.C. section 360bbb-3(b)(1), unless the authorization is terminated or revoked.     Resp Syncytial Virus by PCR NEGATIVE NEGATIVE Final    Comment: (NOTE) Fact Sheet for Patients: EntrepreneurPulse.com.au  Fact Sheet for Healthcare Providers: IncredibleEmployment.be  This test is not yet approved or cleared by the [place] FDA and has been authorized for detection and/or diagnosis of SARS-CoV-2 by FDA under an Emergency Use Authorization (EUA). This EUA will remain in effect (meaning this test can be used) for the duration of the COVID-19 declaration under Section 564(b)(1) of the Act, 21 U.S.C. section 360bbb-3(b)(1), unless the authorization is terminated or revoked.  Performed at [facility], [place] [address] [place], [place] [address]   Culture, blood (Routine X 2) w Reflex to ID Panel     Status: None (Preliminary result)   Collection Time: 10/24/22 11:50 PM   Specimen: BLOOD  Result Value Ref Range Status   Specimen Description BLOOD SITE NOT SPECIFIED  Final   Special Requests   Final    BOTTLES DRAWN AEROBIC AND ANAEROBIC Blood Culture results may not be optimal due to an excessive volume of blood received in culture bottles   Culture   Final    NO GROWTH 3 DAYS Performed at [facility], [place] [address], [place], [place] [address]    Report Status PENDING  Incomplete  Culture, blood (Routine X 2) w Reflex to ID Panel     Status: None (Preliminary result)   Collection Time: 10/24/22 11:55 PM   Specimen: BLOOD  Result Value Ref Range Status   Specimen Description BLOOD SITE NOT SPECIFIED  Final   Special Requests   Final    BOTTLES DRAWN AEROBIC AND ANAEROBIC  Blood Culture results may not be optimal due to an excessive volume of blood received in culture bottles   Culture   Final    NO GROWTH 3 DAYS Performed at [facility], [place] [address] [place], [place] [address]    Report Status PENDING  Incomplete   Radiology Studies: No results found.  Scheduled Meds:  vitamin C  500 mg Oral Daily   atorvastatin  10 mg Oral Daily   clopidogrel  75 mg Oral Daily   docusate sodium  100 mg Oral QHS   DULoxetine  60 mg Oral Daily   enalapril  20 mg Oral BID   enoxaparin (LOVENOX) injection  40  mg Subcutaneous Q24H   gabapentin  400 mg Oral TID   hydrochlorothiazide  50 mg Oral Daily   metoprolol succinate  100 mg Oral Daily   oxyCODONE  30 mg Oral Q12H   polyethylene glycol  17 g Oral Daily   zinc sulfate  220 mg Oral Daily   Continuous Infusions:   LOS: 0 days    Time spent: 35 mins    Naiyana Barbian, MD [facility]   If 7PM-7AM, please contact night-coverage

## 2022-10-28 NOTE — Plan of Care (Signed)
  Problem: Education: Goal: Knowledge of General Education information will improve Description: Including pain rating scale, medication(s)/side effects and non-pharmacologic comfort measures Outcome: Progressing   Problem: Activity: Goal: Risk for activity intolerance will decrease Outcome: Progressing   Problem: Nutrition: Goal: Adequate nutrition will be maintained Outcome: Progressing

## 2022-10-28 NOTE — TOC Progression Note (Signed)
Transition of Care [facility] - Progression Note    Patient Details  Name: SHEANA BIR MRN: [id number] Date of Birth: Oct 11, 1940  [facility] CM/SW Contact  Joanne Chars, LCSW Phone Number: 10/28/2022, 10:54 AM  Clinical Narrative:  TC from [facility].  SNF has been approved, discussed that [facility] are requiring 10 day quarantine before they can admit.     Expected Discharge Plan: [place] Barriers to Discharge: Continued Medical Work up  Expected Discharge Plan and Services In-house Referral: Clinical Social Work   Post Acute Care Choice: [place] Living arrangements for the past 2 months: Single Family Home                                       Social Determinants of Health (SDOH) Interventions    Readmission Risk Interventions     No data to display

## 2022-10-29 DIAGNOSIS — L03115 Cellulitis of right lower limb: Secondary | ICD-10-CM | POA: Diagnosis present

## 2022-10-29 DIAGNOSIS — G894 Chronic pain syndrome: Secondary | ICD-10-CM | POA: Diagnosis present

## 2022-10-29 DIAGNOSIS — S96911A Strain of unspecified muscle and tendon at ankle and foot level, right foot, initial encounter: Secondary | ICD-10-CM | POA: Diagnosis present

## 2022-10-29 DIAGNOSIS — G9341 Metabolic encephalopathy: Secondary | ICD-10-CM | POA: Diagnosis present

## 2022-10-29 DIAGNOSIS — Z79891 Long term (current) use of opiate analgesic: Secondary | ICD-10-CM | POA: Diagnosis not present

## 2022-10-29 DIAGNOSIS — Z886 Allergy status to analgesic agent status: Secondary | ICD-10-CM | POA: Diagnosis not present

## 2022-10-29 DIAGNOSIS — M1990 Unspecified osteoarthritis, unspecified site: Secondary | ICD-10-CM | POA: Diagnosis present

## 2022-10-29 DIAGNOSIS — W19XXXA Unspecified fall, initial encounter: Secondary | ICD-10-CM | POA: Diagnosis present

## 2022-10-29 DIAGNOSIS — G9340 Encephalopathy, unspecified: Secondary | ICD-10-CM | POA: Diagnosis not present

## 2022-10-29 DIAGNOSIS — Z981 Arthrodesis status: Secondary | ICD-10-CM | POA: Diagnosis not present

## 2022-10-29 DIAGNOSIS — Z7902 Long term (current) use of antithrombotics/antiplatelets: Secondary | ICD-10-CM | POA: Diagnosis not present

## 2022-10-29 DIAGNOSIS — R5381 Other malaise: Secondary | ICD-10-CM | POA: Diagnosis present

## 2022-10-29 DIAGNOSIS — U071 COVID-19: Secondary | ICD-10-CM | POA: Diagnosis present

## 2022-10-29 DIAGNOSIS — R0789 Other chest pain: Secondary | ICD-10-CM | POA: Diagnosis present

## 2022-10-29 DIAGNOSIS — Z751 Person awaiting admission to adequate facility elsewhere: Secondary | ICD-10-CM | POA: Diagnosis not present

## 2022-10-29 DIAGNOSIS — D61818 Other pancytopenia: Secondary | ICD-10-CM | POA: Diagnosis present

## 2022-10-29 DIAGNOSIS — I10 Essential (primary) hypertension: Secondary | ICD-10-CM | POA: Diagnosis present

## 2022-10-29 DIAGNOSIS — G629 Polyneuropathy, unspecified: Secondary | ICD-10-CM | POA: Diagnosis present

## 2022-10-29 DIAGNOSIS — Z79899 Other long term (current) drug therapy: Secondary | ICD-10-CM | POA: Diagnosis not present

## 2022-10-29 DIAGNOSIS — D696 Thrombocytopenia, unspecified: Secondary | ICD-10-CM | POA: Diagnosis present

## 2022-10-29 LAB — CBC WITH DIFFERENTIAL/PLATELET
Abs Immature Granulocytes: 0.01 10*3/uL (ref 0.00–0.07)
Basophils Absolute: 0 10*3/uL (ref 0.0–0.1)
Basophils Relative: 1 %
Eosinophils Absolute: 0.1 10*3/uL (ref 0.0–0.5)
Eosinophils Relative: 2 %
HCT: 38.3 % (ref 36.0–46.0)
Hemoglobin: 12.6 g/dL (ref 12.0–15.0)
Immature Granulocytes: 0 %
Lymphocytes Relative: 44 %
Lymphs Abs: 1.8 10*3/uL (ref 0.7–4.0)
MCH: 29.4 pg (ref 26.0–34.0)
MCHC: 32.9 g/dL (ref 30.0–36.0)
MCV: 89.5 fL (ref 80.0–100.0)
Monocytes Absolute: 0.5 10*3/uL (ref 0.1–1.0)
Monocytes Relative: 11 %
Neutro Abs: 1.7 10*3/uL (ref 1.7–7.7)
Neutrophils Relative %: 42 %
Platelets: 159 10*3/uL (ref 150–400)
RBC: 4.28 MIL/uL (ref 3.87–5.11)
RDW: 14.4 % (ref 11.5–15.5)
WBC: 4.1 10*3/uL (ref 4.0–10.5)
nRBC: 0 % (ref 0.0–0.2)

## 2022-10-29 LAB — COMPREHENSIVE METABOLIC PANEL
ALT: 22 U/L (ref 0–44)
AST: 40 U/L (ref 15–41)
Albumin: 2.8 g/dL — ABNORMAL LOW (ref 3.5–5.0)
Alkaline Phosphatase: 67 U/L (ref 38–126)
Anion gap: 8 (ref 5–15)
BUN: 19 mg/dL (ref 8–23)
CO2: 33 mmol/L — ABNORMAL HIGH (ref 22–32)
Calcium: 8.8 mg/dL — ABNORMAL LOW (ref 8.9–10.3)
Chloride: 94 mmol/L — ABNORMAL LOW (ref 98–111)
Creatinine, Ser: 1 mg/dL (ref 0.44–1.00)
GFR, Estimated: 57 mL/min — ABNORMAL LOW (ref >60)
Glucose, Bld: 101 mg/dL — ABNORMAL HIGH (ref 70–99)
Potassium: 4 mmol/L (ref 3.5–5.1)
Sodium: 135 mmol/L (ref 135–145)
Total Bilirubin: 0.5 mg/dL (ref 0.3–1.2)
Total Protein: 7.3 g/dL (ref 6.5–8.1)

## 2022-10-29 LAB — SARS CORONAVIRUS 2 (TAT 6-24 HRS): SARS Coronavirus 2: POSITIVE — AB

## 2022-10-29 MED ORDER — METOPROLOL SUCCINATE ER 50 MG PO TB24
50.0000 mg | ORAL_TABLET | Freq: Every day | ORAL | Status: DC
  Filled 2022-10-29: qty 1

## 2022-10-29 MED ORDER — GUAIFENESIN-DM 100-10 MG/5ML PO SYRP
5.0000 mL | ORAL_SOLUTION | ORAL | Status: DC | PRN
  Administered 2022-11-02: 5 mL via ORAL
  Filled 2022-10-29: qty 10

## 2022-10-29 NOTE — Progress Notes (Signed)
PROGRESS NOTE  Kathleen [name] ONG:[id number] DOB: June 28, 1941 DOA: 10/24/2022 PCP: Garwin Brothers, MD   LOS: 0 days   Brief Narrative / Interim history: This 82 year old female with history of hypertension, osteoarthritis, spinal stenosis and ambulatory dysfunction, chronic pain syndrome on oxycodone at home who was brought to the emergency room with weakness and confusion, low-grade fever and profound debility. She was in the emergency room on 1/25 with leg weakness and fall, skeletal survey was negative and she was discharged home. When she went home she was found to be more weak and debilitated, temperature 101 so brought back. In the emergency room hemodynamically stable. COVID-19 was positive. She was also found to have a small area of cellulitis on the right medial thigh. On room air. Admitted due to profound debility from COVID-19.  Medically clear for discharge to SNF pending 10 day quarantine for SNF.   Subjective / 24h Interval events: She has no complaints this morning, doing well.  Denies any shortness of breath, no cough or chest congestion.  Assesement and Plan: Principal Problem:   Acute encephalopathy in setting of covid 19 Active Problems:   Weakness   Pancytopenia (HCC)   HTN (hypertension)   COVID-19 virus infection   Neuropathic pain   S/P lumbar fusion   Acute metabolic encephalopathy  Principal problem Acute Infective encephalopathy secondary to COVID-19 infection -improved with supportive treatment, mentation appears back to baseline.  Family declined Paxlovid, she has no pulmonary symptoms, no shortness of breath or chest congestion.  She remains on room air.  Continue supportive care.  PT recommends SNF, she will need to finish isolation before she could go.  She tested positive on 10/24/2022, will come off isolation 11/03/2022.  Active problems Profound debility, chronic pain syndrome, spinal stenosis - Patient on chronic pain management.  Continue long-acting  oxycodone and short acting oxycodone. Mobilize with PT OT.  Continue on gabapentin and Cymbalta.  PT recommends SNF, placement pending   Right medial thigh cellulitis -There is no evidence of obvious abscess or collections.  Status post 5 days of Keflex   Essential hypertension -continue regimen with enalapril, HCTZ, metoprolol.  Blood pressure stable   Pancytopenia -Improving, likely in the setting of viral illness.    Scheduled Meds:  vitamin C  500 mg Oral Daily   atorvastatin  10 mg Oral Daily   clopidogrel  75 mg Oral Daily   docusate sodium  100 mg Oral QHS   DULoxetine  60 mg Oral Daily   enalapril  20 mg Oral BID   enoxaparin (LOVENOX) injection  40 mg Subcutaneous Q24H   gabapentin  400 mg Oral TID   hydrochlorothiazide  50 mg Oral Daily   metoprolol succinate  100 mg Oral Daily   oxyCODONE  30 mg Oral Q12H   polyethylene glycol  17 g Oral Daily   zinc sulfate  220 mg Oral Daily   Continuous Infusions: PRN Meds:.acetaminophen **OR** acetaminophen, albuterol, oxyCODONE  Current Outpatient Medications  Medication Instructions   acetaminophen (TYLENOL) 650 mg, Oral, Every 6 hours PRN   ammonium lactate (LAC-HYDRIN) 12 % cream 1 Application, Topical, As needed   ascorbic acid (VITAMIN C) 500 mg, Oral, 2 times daily   atorvastatin (LIPITOR) 10 mg, Oral, Daily   cephALEXin (KEFLEX) 500 mg, Oral, 4 times daily   Cholecalciferol 1,000 Units, Oral, Daily   clopidogrel (PLAVIX) 75 mg, Oral, Daily   docusate sodium (COLACE) 100 mg, Oral, Nightly   DULoxetine (CYMBALTA) 60 mg, Oral, Daily  enalapril (VASOTEC) 20 mg, Oral, 2 times daily   gabapentin (NEURONTIN) 400 mg, Oral, 3 times daily   hydrochlorothiazide (HYDRODIURIL) 50 mg, Oral, Daily   loratadine (CLARITIN) 10 mg, Oral, Daily   metoprolol succinate (TOPROL-XL) 100 mg, Oral, Every evening, Take with or immediately following a meal.   Multiple Vitamins-Minerals (MULTIVITAMIN WITH MINERALS) tablet 1 tablet, Oral, Daily    oxyCODONE (OXY IR/ROXICODONE) 5 mg, Oral, Every 6 hours PRN   OxyCONTIN 30 mg, Oral, 2 times daily    Diet Orders (From admission, onward)     Start     Ordered   10/25/22 0119  Diet Heart Room service appropriate? Yes; Fluid consistency: Thin  Diet effective now       Question Answer Comment  Room service appropriate? Yes   Fluid consistency: Thin      10/25/22 0120            DVT prophylaxis: enoxaparin (LOVENOX) injection 40 mg Start: 10/25/22 1000   Lab Results  Component Value Date   PLT 159 10/29/2022      Code Status: Full Code  Family Communication: no family at bedside   Status is: Inpatient  Remains inpatient appropriate because: waiting SNF  Level of care: Med-Surg  Consultants:  none  Objective: Vitals:   10/28/22 0813 10/28/22 1252 10/28/22 2015 10/29/22 0500  BP: 108/77 111/69 113/79 (!) 104/58  Pulse: 67 64 65 78  Resp: 18 18 17 14   Temp: 98.3 F (36.8 C) (!) 97.4 F (36.3 C) 98.4 F (36.9 C) 97.8 F (36.6 C)  TempSrc:  Oral Oral Oral  SpO2: 100% 90% 100% 97%    Intake/Output Summary (Last 24 hours) at 10/29/2022 0948 Last data filed at 10/29/2022 0700 Gross per 24 hour  Intake 360 ml  Output 825 ml  Net -465 ml   Wt Readings from Last 3 Encounters:  No data found for Wt    Examination:  Constitutional: NAD Eyes: no scleral icterus ENMT: Mucous membranes are moist.  Neck: normal, supple Respiratory: clear to auscultation bilaterally, no wheezing, no crackles. Normal respiratory effort. No accessory muscle use.  Cardiovascular: Regular rate and rhythm, no murmurs / rubs / gallops. No LE edema.  Abdomen: non distended, no tenderness. Bowel sounds positive.    Data Reviewed: I have independently reviewed following labs and imaging studies  CBC Recent Labs  Lab 10/25/22 0031 10/26/22 0341 10/27/22 0345 10/28/22 0507 10/29/22 0112  WBC 3.0* 2.9* 3.0* 3.8* 4.1  HGB 11.0* 11.5* 11.6* 11.6* 12.6  HCT 34.1* 35.2* 34.5*  35.2* 38.3  PLT 135* 130* 125* 136* 159  MCV 92.7 90.0 89.8 90.5 89.5  MCH 29.9 29.4 30.2 29.8 29.4  MCHC 32.3 32.7 33.6 33.0 32.9  RDW 15.1 14.7 14.7 14.5 14.4  LYMPHSABS 0.6* 1.1 1.5 1.8 1.8  MONOABS 0.7 0.5 0.4 0.5 0.5  EOSABS 0.0 0.0 0.1 0.1 0.1  BASOSABS 0.0 0.0 0.0 0.0 0.0    Recent Labs  Lab 10/25/22 0031 10/25/22 0154 10/26/22 0341 10/27/22 0345 10/28/22 0507 10/29/22 0112  NA 135  --  135 135 136 135  K 3.8  --  3.5 3.8 3.8 4.0  CL 99  --  98 97* 97* 94*  CO2 29  --  32 28 32 33*  GLUCOSE 113*  --  108* 91 100* 101*  BUN 12  --  10 11 14 19   CREATININE 0.74  --  0.73 0.76 0.75 1.00  CALCIUM 8.5*  --  8.2* 8.3*  8.6* 8.8*  AST 41  --  35 30 33 40  ALT 19  --  19 18 19 22   ALKPHOS 57  --  56 54 61 67  BILITOT 0.6  --  0.5 0.4 0.5 0.5  ALBUMIN 2.9*  --  2.6* 2.6* 2.5* 2.8*  MG  --  1.7  --   --   --   --   CRP  --  4.3*  --   --   --   --   DDIMER  --  1.99*  --   --   --   --   PROCALCITON  --  <0.10  --   --   --   --   TSH 0.225*  --   --   --   --   --   AMMONIA 34  --   --   --   --   --   BNP  --  298.7*  --   --   --   --     ------------------------------------------------------------------------------------------------------------------ No results for input(s): "CHOL", "HDL", "LDLCALC", "TRIG", "CHOLHDL", "LDLDIRECT" in the last 72 hours.  No results found for: "HGBA1C" ------------------------------------------------------------------------------------------------------------------ No results for input(s): "TSH", "T4TOTAL", "T3FREE", "THYROIDAB" in the last 72 hours.  Invalid input(s): "FREET3"  Cardiac Enzymes No results for input(s): "CKMB", "TROPONINI", "MYOGLOBIN" in the last 168 hours.  Invalid input(s): "CK" ------------------------------------------------------------------------------------------------------------------    Component Value Date/Time   BNP 298.7 (H) 10/25/2022 0154    CBG: No results for input(s): "GLUCAP" in the last  168 hours.  Recent Results (from the past 240 hour(s))  Resp panel by RT-PCR (RSV, Flu A&B, Covid) Anterior Nasal Swab     Status: Abnormal   Collection Time: 10/24/22  2:55 PM   Specimen: Anterior Nasal Swab  Result Value Ref Range Status   SARS Coronavirus 2 by RT PCR POSITIVE (A) NEGATIVE Final    Comment: (NOTE) SARS-CoV-2 target nucleic acids are DETECTED.  The SARS-CoV-2 RNA is generally detectable in upper respiratory specimens during the acute phase of infection. Positive results are indicative of the presence of the identified virus, but do not rule out bacterial infection or co-infection with other pathogens not detected by the test. Clinical correlation with patient history and other diagnostic information is necessary to determine patient infection status. The expected result is Negative.  Fact Sheet for Patients: EntrepreneurPulse.com.au  Fact Sheet for Healthcare Providers: IncredibleEmployment.be  This test is not yet approved or cleared by the [place] FDA and  has been authorized for detection and/or diagnosis of SARS-CoV-2 by FDA under an Emergency Use Authorization (EUA).  This EUA will remain in effect (meaning this test can be used) for the duration of  the COVID-19 declaration under Section 564(b)(1) of the A ct, 21 U.S.C. section 360bbb-3(b)(1), unless the authorization is terminated or revoked sooner.     Influenza A by PCR NEGATIVE NEGATIVE Final   Influenza B by PCR NEGATIVE NEGATIVE Final    Comment: (NOTE) The Xpert Xpress SARS-CoV-2/FLU/RSV plus assay is intended as an aid in the diagnosis of influenza from Nasopharyngeal swab specimens and should not be used as a sole basis for treatment. Nasal washings and aspirates are unacceptable for Xpert Xpress SARS-CoV-2/FLU/RSV testing.  Fact Sheet for Patients: EntrepreneurPulse.com.au  Fact Sheet for Healthcare  Providers: IncredibleEmployment.be  This test is not yet approved or cleared by the [place] FDA and has been authorized for detection and/or diagnosis of SARS-CoV-2 by FDA under an Emergency  Use Authorization (EUA). This EUA will remain in effect (meaning this test can be used) for the duration of the COVID-19 declaration under Section 564(b)(1) of the Act, 21 U.S.C. section 360bbb-3(b)(1), unless the authorization is terminated or revoked.     Resp Syncytial Virus by PCR NEGATIVE NEGATIVE Final    Comment: (NOTE) Fact Sheet for Patients: BloggerCourse.com  Fact Sheet for Healthcare Providers: SeriousBroker.it  This test is not yet approved or cleared by the [place] FDA and has been authorized for detection and/or diagnosis of SARS-CoV-2 by FDA under an Emergency Use Authorization (EUA). This EUA will remain in effect (meaning this test can be used) for the duration of the COVID-19 declaration under Section 564(b)(1) of the Act, 21 U.S.C. section 360bbb-3(b)(1), unless the authorization is terminated or revoked.  Performed at [facility], [address], [place], [place] [address]   Culture, blood (Routine X 2) w Reflex to ID Panel     Status: None (Preliminary result)   Collection Time: 10/24/22 11:50 PM   Specimen: BLOOD  Result Value Ref Range Status   Specimen Description BLOOD SITE NOT SPECIFIED  Final   Special Requests   Final    BOTTLES DRAWN AEROBIC AND ANAEROBIC Blood Culture results may not be optimal due to an excessive volume of blood received in culture bottles   Culture   Final    NO GROWTH 4 DAYS Performed at [facility], [address], [place], [place] [address]    Report Status PENDING  Incomplete  Culture, blood (Routine X 2) w Reflex to ID Panel     Status: None (Preliminary result)   Collection Time: 10/24/22 11:55 PM   Specimen: BLOOD  Result Value Ref  Range Status   Specimen Description BLOOD SITE NOT SPECIFIED  Final   Special Requests   Final    BOTTLES DRAWN AEROBIC AND ANAEROBIC Blood Culture results may not be optimal due to an excessive volume of blood received in culture bottles   Culture   Final    NO GROWTH 4 DAYS Performed at [facility], [address] [place], [place] [address]    Report Status PENDING  Incomplete     Radiology Studies: No results found.   Pamella Pert, MD, PhD [facility]  Between 7 am - 7 pm I am available, please contact me via Amion (for emergencies) or Securechat (non urgent messages)  Between 7 pm - 7 am I am not available, please contact night coverage MD/APP via Amion

## 2022-10-29 NOTE — TOC Progression Note (Signed)
Transition of Care [facility]) - Progression Note    Patient Details  Name: Kathleen [name] MRN: [id number] Date of Birth: 1941-06-21  Transition [facility] CM/SW Contact  Joanne Chars, LCSW Phone Number: 10/29/2022, 10:31 AM  Clinical Narrative:   CSW spoke to pt by phone and presented bed offers.  Medicare choice document will be brought into room by RN.  She would like to speak with her son and daughter before choosing and asked CSW to call them as well.  CSW spoke with both son Beverely Low and daugher Odette Fraction on conference call, discussed bed offers and potential DC date after covid quarantine.  They will discuss with pt and call back.   1500: TC [facility].  She called, update provided.  Expected Discharge Plan: [place] Barriers to Discharge: Continued Medical Work up  Expected Discharge Plan and Services In-house Referral: Clinical Social Work   Post Acute Care Choice: [place] Living arrangements for the past 2 months: Single Family Home                                       Social Determinants of Health (SDOH) Interventions    Readmission Risk Interventions     No data to display

## 2022-10-29 NOTE — Progress Notes (Signed)
Occupational Therapy Treatment Patient Details Name: Kathleen [name] MRN: [id number] DOB: 02-19-1941 Today's Date: 10/29/2022   History of present illness Pt is a 82 y.o. F who presents 10/24/2022 with complaints of weakness and confusion. Pt with recent fall 1/25 in which she was in ED. Found to have Covid-19. Significant PMH: HTN, OA, spinal stenosis, s/p thoracic laminectomy and fusion.   OT comments  Pt eager to get OOB to chair. Able to perform supine to sit with supervision (HOB up), stand from elevated surface with +2 max assist. Stedy does not accommodate pt's body habitus. Pt can self feed and groom with set up. Nursing to use maximove to transfer pt to and from chair. SNF remains the optimal discharge disposition.    Recommendations for follow up therapy are one component of a multi-disciplinary discharge planning process, led by the attending physician.  Recommendations may be updated based on patient status, additional functional criteria and insurance authorization.    Follow Up Recommendations  Skilled nursing-short term rehab (<3 hours/day)     Assistance Recommended at Discharge Frequent or constant Supervision/Assistance  Patient can return home with the following  Two people to help with walking and/or transfers;A lot of help with bathing/dressing/bathroom;Assistance with cooking/housework;Direct supervision/assist for medications management;Direct supervision/assist for financial management;Assist for transportation;Help with stairs or ramp for entrance   Equipment Recommendations  Other (comment) (defer to SNF)    Recommendations for Other Services      Precautions / Restrictions Precautions Precautions: Fall Precaution Comments: Airborne/Contact precs Restrictions Weight Bearing Restrictions: No       Mobility Bed Mobility Overal bed mobility: Needs Assistance Bed Mobility: Supine to Sit     Supine to sit: Supervision     General bed mobility comments:  HOB up, no assist, increased time    Transfers Overall transfer level: Needs assistance   Transfers: Sit to/from Stand Sit to Stand: From elevated surface, +2 physical assistance, Max assist           General transfer comment: assist to rise and steady, pt unable to use bari stedy due to large body habitus     Balance Overall balance assessment: Needs assistance   Sitting balance-Leahy Scale: Fair       Standing balance-Leahy Scale: Poor                             ADL either performed or assessed with clinical judgement   ADL   Eating/Feeding: Independent;Sitting   Grooming: Set up;Sitting                                      Extremity/Trunk Assessment              Vision       Perception     Praxis      Cognition Arousal/Alertness: Awake/alert Behavior During Therapy: WFL for tasks assessed/performed, Anxious Overall Cognitive Status: Impaired/Different from baseline Area of Impairment: Memory                               General Comments: may be baseline        Exercises      Shoulder Instructions       General Comments      Pertinent Vitals/ Pain       Pain  Assessment Pain Assessment: Faces Faces Pain Scale: Hurts little more Pain Location: LEs with touch Pain Descriptors / Indicators: Discomfort Pain Intervention(s): Monitored during session, Repositioned  Home Living Family/patient expects to be discharged to:: Private residence                                        Prior Functioning/Environment              Frequency  Min 2X/week        Progress Toward Goals  OT Goals(current goals can now be found in the care plan section)  Progress towards OT goals: Progressing toward goals  Acute Rehab OT Goals OT Goal Formulation: With patient Time For Goal Achievement: 11/09/22 Potential to Achieve Goals: Good  Plan Discharge plan remains appropriate     Co-evaluation                 AM-PAC OT "6 Clicks" Daily Activity     Outcome Measure   Help from another person eating meals?: None Help from another person taking care of personal grooming?: A Little Help from another person toileting, which includes using toliet, bedpan, or urinal?: Total Help from another person bathing (including washing, rinsing, drying)?: A Lot Help from another person to put on and taking off regular upper body clothing?: A Little Help from another person to put on and taking off regular lower body clothing?: Total 6 Click Score: 14    End of Session Equipment Utilized During Treatment: Gait belt  OT Visit Diagnosis: Unsteadiness on feet (R26.81);Other abnormalities of gait and mobility (R26.89);Muscle weakness (generalized) (M62.81);Pain   Activity Tolerance Patient tolerated treatment well   Patient Left     Nurse Communication Need for lift equipment;Mobility status (recommend use of maximove)        Time: 5366-4403 OT Time Calculation (min): 29 min  Charges: OT General Charges $OT Visit: 1 Visit OT Treatments $Therapeutic Activity: 23-37 mins  Kathleen [name], Kathleen [name] Acute Rehabilitation Services Office: [phone]   Kathleen [name] 10/29/2022, 3:51 PM

## 2022-10-30 ENCOUNTER — Inpatient Hospital Stay (HOSPITAL_COMMUNITY): Payer: Medicare (Managed Care)

## 2022-10-30 DIAGNOSIS — G9340 Encephalopathy, unspecified: Secondary | ICD-10-CM | POA: Diagnosis not present

## 2022-10-30 LAB — CULTURE, BLOOD (ROUTINE X 2)
Culture: NO GROWTH
Culture: NO GROWTH

## 2022-10-30 NOTE — Progress Notes (Addendum)
Physical Therapy Treatment Patient Details Name: Kathleen [name] MRN: [id number] DOB: June 08, 1941 Today's Date: 10/30/2022   History of Present Illness Pt is a 82 y.o. F who presents 10/24/2022 with complaints of weakness and confusion. Pt with recent fall 1/25 in which she was in ED. Found to have Covid-19. Significant PMH: HTN, OA, spinal stenosis, s/p thoracic laminectomy and fusion.    PT Comments    Pt received in supine, agreeable to therapy session and with good participation and tolerance for bed mobility, transfers and pre-gait tasks at bedside, pt able to take a few shuffled forward and sidesteps at bedside but limited due to tachycardia and BLE pain (seems worst at R posterior knee where she is hyperextending). Pt may benefit from consult from orthopedic specialist to see if any bracing options to protect her R knee (would need MD order for R knee cage orthosis vs bracing options to consult [facility] to assess her). Pt with good effort for standing tasks and needing up to +2 mod/maxA for transfers and initial gait tasks. Pt continues to benefit from PT services to progress toward functional mobility goals.   Recommendations for follow up therapy are one component of a multi-disciplinary discharge planning process, led by the attending physician.  Recommendations may be updated based on patient status, additional functional criteria and insurance authorization.  Follow Up Recommendations  Skilled nursing-short term rehab (<3 hours/day) Can patient physically be transported by private vehicle: No   Assistance Recommended at Discharge Frequent or constant Supervision/Assistance  Patient can return home with the following Two people to help with walking and/or transfers;Two people to help with bathing/dressing/bathroom;Help with stairs or ramp for entrance;Assist for transportation;Assistance with cooking/housework   Equipment Recommendations  Other (comment) (TBD post-acute)     Recommendations for Other Services       Precautions / Restrictions Precautions Precautions: Fall Precaution Comments: Airborne/Contact precs, R knee hyperext with WB Restrictions Weight Bearing Restrictions: No     Mobility  Bed Mobility Overal bed mobility: Needs Assistance Bed Mobility: Supine to Sit     Supine to sit: HOB elevated, Min assist     General bed mobility comments: HOB up, heavy use of bed rail, increased time, needed minA for trunk support due to posterior lean/difficulty sequencing and not reaching cross-body to opposite rail despite cues. Pt needing bed pad assist to advance hips to foot flat.    Transfers Overall transfer level: Needs assistance Equipment used: Rolling walker (2 wheels) (bariatric size) Transfers: Sit to/from Stand, Bed to chair/wheelchair/BSC Sit to Stand: From elevated surface, +2 physical assistance, Max assist           General transfer comment: elevated bed>bari RW, increased time to rise and steady, cues for UE placement and multimodal cues needed for body mechanics; use of momentum to achieve upright, pt needing modA on L side but closer to maxA on R due to posterior bias/trunk flexion initially upon standing.    Ambulation/Gait Ambulation/Gait assistance: Mod assist, +2 physical assistance, +2 safety/equipment Gait Distance (Feet): 6 Feet Assistive device: Rolling walker (2 wheels) (bariatric) Gait Pattern/deviations: Step-to pattern, Shuffle, Knee hyperextension - right, Trunk flexed, Wide base of support, Decreased step length - right, Decreased step length - left, Decreased dorsiflexion - right, Decreased dorsiflexion - left       General Gait Details: Pt c/o BLE discomfort and noted RLE hyperextension, dense cues for upright posture and forward head as pt tends to significantly flex her trunk. HR max 140 bpm with  exertion, SpO2 WFL on RA. Chair pulled up behind her to sit as pt fatigued.   Stairs              Wheelchair Mobility    Modified Rankin (Stroke Patients Only)       Balance Overall balance assessment: Needs assistance Sitting-balance support: Feet supported Sitting balance-Leahy Scale: Fair     Standing balance support: Bilateral upper extremity supported, Reliant on assistive device for balance Standing balance-Leahy Scale: Poor Standing balance comment: reliant on RW and +2 external assist for safety                            Cognition Arousal/Alertness: Awake/alert Behavior During Therapy: WFL for tasks assessed/performed, Anxious Overall Cognitive Status: Impaired/Different from baseline Area of Impairment: Memory                               General Comments: may be baseline; able to make her needs known and to recall her birthday celebration from last year. Pt not sure who she has talked to so far on the phone for her bday and needed help to answer the phone (went to answer on call bell till therapist handed her the landline phone and pressed button for her to answer).        Exercises Other Exercises Other Exercises: supine BLE AROM: ankle pumps x10 reps ea Other Exercises: seated BLE AROM: LAQ, hip flexion x5 reps ea    General Comments General comments (skin integrity, edema, etc.): HR tachy with exertion to 140 bpm, SpO2 WFL on RA. No dizziness reported however pt quick to fatigue while standing. Lift pad under her in chair for comfort and RN notified pt c/o discomfort in peri area and bed linens had been wet, she will need barrier ointment applied prior to return to bed for skin protection (pt was too tired to stand up again for PTA to apply and unable to reach the area while she sat in recliner).      Pertinent Vitals/Pain Pain Assessment Pain Assessment: Faces Faces Pain Scale: Hurts little more Pain Location: BLE (pt hyperextending R knee and c/o posterior knee discomfort), peri area discomfort Pain Descriptors /  Indicators: Discomfort, Sore, Guarding Pain Intervention(s): Monitored during session, Repositioned, Limited activity within patient's tolerance     PT Goals (current goals can now be found in the care plan section) Acute Rehab PT Goals Patient Stated Goal: pt daughter agreeable to rehab PT Goal Formulation: With patient/family Time For Goal Achievement: 11/08/22 Progress towards PT goals: Progressing toward goals    Frequency    Min 2X/week      PT Plan Current plan remains appropriate       AM-PAC PT "6 Clicks" Mobility   Outcome Measure  Help needed turning from your back to your side while in a flat bed without using bedrails?: A Little Help needed moving from lying on your back to sitting on the side of a flat bed without using bedrails?: A Lot Help needed moving to and from a bed to a chair (including a wheelchair)?: A Lot Help needed standing up from a chair using your arms (e.g., wheelchair or bedside chair)?: Total Help needed to walk in hospital room?: Total Help needed climbing 3-5 steps with a railing? : Total 6 Click Score: 10    End of Session Equipment Utilized During Treatment: Gait belt Activity  Tolerance: Patient tolerated treatment well;Patient limited by pain;Other (comment);Treatment limited secondary to medical complications (Comment) (tachycardia, BLE pain) Patient left: in chair;with call bell/phone within reach;with chair alarm set;Other (comment) (lift pad under her for return transfer) Nurse Communication: Mobility status;Need for lift equipment;Precautions (lift pad under her for return transfer vs lateral scoot transfer from drop arm chair) PT Visit Diagnosis: Other abnormalities of gait and mobility (R26.89);Muscle weakness (generalized) (M62.81);History of falling (Z91.81)     Time: 0867-6195 PT Time Calculation (min) (ACUTE ONLY): 30 min  Charges:  $Therapeutic Activity: 23-37 mins                     Briar Witherspoon P., PTA Acute Rehabilitation  Services Secure Chat Preferred 9a-5:30pm Office: [place] 10/30/2022, 2:33 PM

## 2022-10-30 NOTE — TOC Progression Note (Signed)
Transition of Care [facility]) - Progression Note    Patient Details  Name: Kathleen [name] MRN: [id number] Date of Birth: 06-May-1941  [facility]) CM/SW Contact  Joanne Chars, LCSW Phone Number: 10/30/2022, 11:13 AM  Clinical Narrative:   CSW spoke with pt by phone, she has not spoken to her children about which SNF they want to choose.  CSW spoke to son Beverely Low, he is planning to visit the options today, will make a decision after that.    Expected Discharge Plan: [place] Barriers to Discharge: Continued Medical Work up  Expected Discharge Plan and Services In-house Referral: Clinical Social Work   Post Acute Care Choice: [place] Living arrangements for the past 2 months: Single Family Home                                       Social Determinants of Health (SDOH) Interventions    Readmission Risk Interventions     No data to display

## 2022-10-30 NOTE — Progress Notes (Signed)
PROGRESS NOTE  Kathleen [name] NIO:[id number] DOB: 05/19/1941 DOA: 10/24/2022 PCP: Garwin Brothers, MD   LOS: 1 day   Brief Narrative / Interim history: This 82 year old female with history of hypertension, osteoarthritis, spinal stenosis and ambulatory dysfunction, chronic pain syndrome on oxycodone at home who was brought to the emergency room with weakness and confusion, low-grade fever and profound debility. She was in the emergency room on 1/25 with leg weakness and fall, skeletal survey was negative and she was discharged home. When she went home she was found to be more weak and debilitated, temperature 101 so brought back. In the emergency room hemodynamically stable. COVID-19 was positive. She was also found to have a small area of cellulitis on the right medial thigh. On room air. Admitted due to profound debility from COVID-19.  Medically clear for discharge to SNF pending 10 day quarantine for SNF.   Subjective / 24h Interval events: She is eating breakfast today.  She denies any shortness of breath, however does state that she has been having increased coughing.  Assesement and Plan: Principal Problem:   Acute encephalopathy in setting of covid 19 Active Problems:   Weakness   Pancytopenia (HCC)   HTN (hypertension)   COVID-19 virus infection   Neuropathic pain   S/P lumbar fusion   Acute metabolic encephalopathy  Principal problem Acute Infective encephalopathy secondary to COVID-19 infection -improved with supportive treatment, mentation appears back to baseline.  Family declined Paxlovid, she has no pulmonary symptoms, no shortness of breath or chest congestion.  She remains on room air.  Continue supportive care.  PT recommends SNF, she will need to finish isolation before she could go.  She tested positive on 10/24/2022, will come off isolation 11/03/2022. -Due to increased coughing repeat the chest x-ray today  Active problems Profound debility, chronic pain syndrome, spinal  stenosis - Patient on chronic pain management.  Continue long-acting oxycodone and short acting oxycodone. Mobilize with PT OT.  Continue on gabapentin and Cymbalta.  PT recommends SNF, placement pending coming off isolation   Right medial thigh cellulitis -There is no evidence of obvious abscess or collections.  Status post 5 days of Keflex   Essential hypertension -continue regimen with enalapril, HCTZ, metoprolol.  Blood pressure is stable this morning   Pancytopenia -Improving, likely in the setting of viral illness.    Scheduled Meds:  vitamin C  500 mg Oral Daily   atorvastatin  10 mg Oral Daily   clopidogrel  75 mg Oral Daily   docusate sodium  100 mg Oral QHS   DULoxetine  60 mg Oral Daily   enalapril  20 mg Oral BID   enoxaparin (LOVENOX) injection  40 mg Subcutaneous Q24H   gabapentin  400 mg Oral TID   metoprolol succinate  50 mg Oral Daily   oxyCODONE  30 mg Oral Q12H   polyethylene glycol  17 g Oral Daily   zinc sulfate  220 mg Oral Daily   Continuous Infusions: PRN Meds:.acetaminophen **OR** acetaminophen, albuterol, guaiFENesin-dextromethorphan, oxyCODONE  Current Outpatient Medications  Medication Instructions   acetaminophen (TYLENOL) 650 mg, Oral, Every 6 hours PRN   ammonium lactate (LAC-HYDRIN) 12 % cream 1 Application, Topical, As needed   ascorbic acid (VITAMIN C) 500 mg, Oral, 2 times daily   atorvastatin (LIPITOR) 10 mg, Oral, Daily   cephALEXin (KEFLEX) 500 mg, Oral, 4 times daily   Cholecalciferol 1,000 Units, Oral, Daily   clopidogrel (PLAVIX) 75 mg, Oral, Daily   docusate sodium (COLACE) 100  mg, Oral, Nightly   DULoxetine (CYMBALTA) 60 mg, Oral, Daily   enalapril (VASOTEC) 20 mg, Oral, 2 times daily   gabapentin (NEURONTIN) 400 mg, Oral, 3 times daily   hydrochlorothiazide (HYDRODIURIL) 50 mg, Oral, Daily   loratadine (CLARITIN) 10 mg, Oral, Daily   metoprolol succinate (TOPROL-XL) 100 mg, Oral, Every evening, Take with or immediately following a  meal.   Multiple Vitamins-Minerals (MULTIVITAMIN WITH MINERALS) tablet 1 tablet, Oral, Daily   oxyCODONE (OXY IR/ROXICODONE) 5 mg, Oral, Every 6 hours PRN   OxyCONTIN 30 mg, Oral, 2 times daily    Diet Orders (From admission, onward)     Start     Ordered   10/25/22 0119  Diet Heart Room service appropriate? Yes; Fluid consistency: Thin  Diet effective now       Question Answer Comment  Room service appropriate? Yes   Fluid consistency: Thin      10/25/22 0120            DVT prophylaxis: enoxaparin (LOVENOX) injection 40 mg Start: 10/25/22 1000   Lab Results  Component Value Date   PLT 159 10/29/2022      Code Status: Full Code  Family Communication: no family at bedside   Status is: Inpatient  Remains inpatient appropriate because: waiting SNF  Level of care: Med-Surg  Consultants:  none  Objective: Vitals:   10/29/22 1100 10/29/22 2207 10/30/22 0637 10/30/22 0830  BP: 104/62 117/68 128/62 (!) 102/55  Pulse: 75 72 76 80  Resp:  16 12 15   Temp:  98 F (36.7 C) 98.1 F (36.7 C) 98.1 F (36.7 C)  TempSrc:  Oral Oral   SpO2:  100% 99% 94%    Intake/Output Summary (Last 24 hours) at 10/30/2022 12/29/2022 Last data filed at 10/30/2022 12/29/2022 Gross per 24 hour  Intake 960 ml  Output 950 ml  Net 10 ml    Wt Readings from Last 3 Encounters:  No data found for Wt    Examination:  Constitutional: NAD Eyes: lids and conjunctivae normal, no scleral icterus ENMT: mmm Neck: normal, supple Respiratory: clear to auscultation bilaterally, no wheezing, no crackles. Normal respiratory effort.  Cardiovascular: Regular rate and rhythm, no murmurs / rubs / gallops. No LE edema. Abdomen: soft, no distention, no tenderness. Bowel sounds positive.    Data Reviewed: I have independently reviewed following labs and imaging studies  CBC Recent Labs  Lab 10/25/22 0031 10/26/22 0341 10/27/22 0345 10/28/22 0507 10/29/22 0112  WBC 3.0* 2.9* 3.0* 3.8* 4.1  HGB 11.0*  11.5* 11.6* 11.6* 12.6  HCT 34.1* 35.2* 34.5* 35.2* 38.3  PLT 135* 130* 125* 136* 159  MCV 92.7 90.0 89.8 90.5 89.5  MCH 29.9 29.4 30.2 29.8 29.4  MCHC 32.3 32.7 33.6 33.0 32.9  RDW 15.1 14.7 14.7 14.5 14.4  LYMPHSABS 0.6* 1.1 1.5 1.8 1.8  MONOABS 0.7 0.5 0.4 0.5 0.5  EOSABS 0.0 0.0 0.1 0.1 0.1  BASOSABS 0.0 0.0 0.0 0.0 0.0     Recent Labs  Lab 10/25/22 0031 10/25/22 0154 10/26/22 0341 10/27/22 0345 10/28/22 0507 10/29/22 0112  NA 135  --  135 135 136 135  K 3.8  --  3.5 3.8 3.8 4.0  CL 99  --  98 97* 97* 94*  CO2 29  --  32 28 32 33*  GLUCOSE 113*  --  108* 91 100* 101*  BUN 12  --  10 11 14 19   CREATININE 0.74  --  0.73 0.76 0.75 1.00  CALCIUM 8.5*  --  8.2* 8.3* 8.6* 8.8*  AST 41  --  35 30 33 40  ALT 19  --  19 18 19 22   ALKPHOS 57  --  56 54 61 67  BILITOT 0.6  --  0.5 0.4 0.5 0.5  ALBUMIN 2.9*  --  2.6* 2.6* 2.5* 2.8*  MG  --  1.7  --   --   --   --   CRP  --  4.3*  --   --   --   --   DDIMER  --  1.99*  --   --   --   --   PROCALCITON  --  <0.10  --   --   --   --   TSH 0.225*  --   --   --   --   --   AMMONIA 34  --   --   --   --   --   BNP  --  298.7*  --   --   --   --      ------------------------------------------------------------------------------------------------------------------ No results for input(s): "CHOL", "HDL", "LDLCALC", "TRIG", "CHOLHDL", "LDLDIRECT" in the last 72 hours.  No results found for: "HGBA1C" ------------------------------------------------------------------------------------------------------------------ No results for input(s): "TSH", "T4TOTAL", "T3FREE", "THYROIDAB" in the last 72 hours.  Invalid input(s): "FREET3"  Cardiac Enzymes No results for input(s): "CKMB", "TROPONINI", "MYOGLOBIN" in the last 168 hours.  Invalid input(s): "CK" ------------------------------------------------------------------------------------------------------------------    Component Value Date/Time   BNP 298.7 (H) 10/25/2022 0154     CBG: No results for input(s): "GLUCAP" in the last 168 hours.  Recent Results (from the past 240 hour(s))  Resp panel by RT-PCR (RSV, Flu A&B, Covid) Anterior Nasal Swab     Status: Abnormal   Collection Time: 10/24/22  2:55 PM   Specimen: Anterior Nasal Swab  Result Value Ref Range Status   SARS Coronavirus 2 by RT PCR POSITIVE (A) NEGATIVE Final    Comment: (NOTE) SARS-CoV-2 target nucleic acids are DETECTED.  The SARS-CoV-2 RNA is generally detectable in upper respiratory specimens during the acute phase of infection. Positive results are indicative of the presence of the identified virus, but do not rule out bacterial infection or co-infection with other pathogens not detected by the test. Clinical correlation with patient history and other diagnostic information is necessary to determine patient infection status. The expected result is Negative.  Fact Sheet for Patients: EntrepreneurPulse.com.au  Fact Sheet for Healthcare Providers: IncredibleEmployment.be  This test is not yet approved or cleared by the [place] FDA and  has been authorized for detection and/or diagnosis of SARS-CoV-2 by FDA under an Emergency Use Authorization (EUA).  This EUA will remain in effect (meaning this test can be used) for the duration of  the COVID-19 declaration under Section 564(b)(1) of the A ct, 21 U.S.C. section 360bbb-3(b)(1), unless the authorization is terminated or revoked sooner.     Influenza A by PCR NEGATIVE NEGATIVE Final   Influenza B by PCR NEGATIVE NEGATIVE Final    Comment: (NOTE) The Xpert Xpress SARS-CoV-2/FLU/RSV plus assay is intended as an aid in the diagnosis of influenza from Nasopharyngeal swab specimens and should not be used as a sole basis for treatment. Nasal washings and aspirates are unacceptable for Xpert Xpress SARS-CoV-2/FLU/RSV testing.  Fact Sheet for  Patients: EntrepreneurPulse.com.au  Fact Sheet for Healthcare Providers: IncredibleEmployment.be  This test is not yet approved or cleared by the [place] FDA and has been authorized for detection and/or  diagnosis of SARS-CoV-2 by FDA under an Emergency Use Authorization (EUA). This EUA will remain in effect (meaning this test can be used) for the duration of the COVID-19 declaration under Section 564(b)(1) of the Act, 21 U.S.C. section 360bbb-3(b)(1), unless the authorization is terminated or revoked.     Resp Syncytial Virus by PCR NEGATIVE NEGATIVE Final    Comment: (NOTE) Fact Sheet for Patients: EntrepreneurPulse.com.au  Fact Sheet for Healthcare Providers: IncredibleEmployment.be  This test is not yet approved or cleared by the [place] FDA and has been authorized for detection and/or diagnosis of SARS-CoV-2 by FDA under an Emergency Use Authorization (EUA). This EUA will remain in effect (meaning this test can be used) for the duration of the COVID-19 declaration under Section 564(b)(1) of the Act, 21 U.S.C. section 360bbb-3(b)(1), unless the authorization is terminated or revoked.  Performed at [facility], [place] [address], [place], [place] [address]   Culture, blood (Routine X 2) w Reflex to ID Panel     Status: None   Collection Time: 10/24/22 11:50 PM   Specimen: BLOOD  Result Value Ref Range Status   Specimen Description BLOOD SITE NOT SPECIFIED  Final   Special Requests   Final    BOTTLES DRAWN AEROBIC AND ANAEROBIC Blood Culture results may not be optimal due to an excessive volume of blood received in culture bottles   Culture   Final    NO GROWTH 5 DAYS Performed at [facility], [place] [address] [place], [place] [address]    Report Status 10/30/2022 FINAL  Final  Culture, blood (Routine X 2) w Reflex to ID Panel     Status: None   Collection Time: 10/24/22 11:55  PM   Specimen: BLOOD  Result Value Ref Range Status   Specimen Description BLOOD SITE NOT SPECIFIED  Final   Special Requests   Final    BOTTLES DRAWN AEROBIC AND ANAEROBIC Blood Culture results may not be optimal due to an excessive volume of blood received in culture bottles   Culture   Final    NO GROWTH 5 DAYS Performed at [facility], [place] [address], [place], [place] [address]    Report Status 10/30/2022 FINAL  Final  SARS CORONAVIRUS 2 (TAT 6-24 HRS) Anterior Nasal Swab     Status: Abnormal   Collection Time: 10/29/22 12:00 PM   Specimen: Anterior Nasal Swab  Result Value Ref Range Status   SARS Coronavirus 2 POSITIVE (A) NEGATIVE Final    Comment: (NOTE) SARS-CoV-2 target nucleic acids are DETECTED.  The SARS-CoV-2 RNA is generally detectable in upper and lower respiratory specimens during the acute phase of infection. Positive results are indicative of the presence of SARS-CoV-2 RNA. Clinical correlation with patient history and other diagnostic information is  necessary to determine patient infection status. Positive results do not rule out bacterial infection or co-infection with other viruses.  The expected result is Negative.  Fact Sheet for Patients: SugarRoll.be  Fact Sheet for Healthcare Providers: https://www.woods-mathews.com/  This test is not yet approved or cleared by the [place] FDA and  has been authorized for detection and/or diagnosis of SARS-CoV-2 by FDA under an Emergency Use Authorization (EUA). This EUA will remain  in effect (meaning this test can be used) for the duration of the COVID-19 declaration under Section 564(b)(1) of the Act, 21 U. S.C. section 360bbb-3(b)(1), unless the authorization is terminated or revoked sooner.   Performed at [facility], [place]  [address] [place], [place] [address]      Radiology Studies: No results found.   Marzetta Board, MD, PhD [facility]  Between 7 am - 7 pm I am available, please contact me via Amion (for emergencies) or Securechat (non urgent messages)  Between 7 pm - 7 am I am not available, please contact night coverage MD/APP via Amion

## 2022-10-30 NOTE — Plan of Care (Signed)
  Problem: Nutrition: Goal: Adequate nutrition will be maintained Outcome: Progressing   Problem: Coping: Goal: Level of anxiety will decrease Outcome: Progressing   Problem: Pain Managment: Goal: General experience of comfort will improve Outcome: Progressing   

## 2022-10-31 DIAGNOSIS — G9340 Encephalopathy, unspecified: Secondary | ICD-10-CM | POA: Diagnosis not present

## 2022-10-31 MED ORDER — METOPROLOL SUCCINATE ER 25 MG PO TB24
25.0000 mg | ORAL_TABLET | Freq: Every day | ORAL | Status: DC
  Administered 2022-10-31 – 2022-11-03 (×3): 25 mg via ORAL
  Filled 2022-10-31 (×4): qty 1

## 2022-10-31 NOTE — Progress Notes (Addendum)
Spoke to pt's son re current SNF offers and he has accepted offer from [facility]. Voicemail left for Emmie Niemann with [facility] requesting auth for SNF. Confirmed with Kitty at [facility] they are prepared to admit pt on Monday. SW will follow.    UPDATE: spoke to Ford with [facility] who confirmed they will authorize SNF and she will reach out to [facility]. [facility] plans to transport via wheelchair van.   Wandra Feinstein, MSW, LCSW [phone] (coverage)

## 2022-10-31 NOTE — Progress Notes (Signed)
Physical Therapy Treatment Patient Details Name: Kathleen [name] MRN: [id number] DOB: 08-22-1941 Today's Date: 10/31/2022   History of Present Illness Pt is a 82 y.o. F who presents 10/24/2022 with complaints of weakness and confusion. Pt with recent fall 1/25 in which she was in ED. Found to have Covid-19. Significant PMH: HTN, OA, spinal stenosis, s/p thoracic laminectomy and fusion.    PT Comments    Pt is progressing towards goals. Pt continues to be limited by significant hyperextension at the R knee with standing/stepping ~10 degrees hyperextension intermittently during short step pivot transfer from EOB to recliner at Mod A with RW. Pt has improved bed mobility to supervision but continues requiring significant assistance for transfers and gait. Due to pt current functional status, home set up and available assistance at home recommending skilled physical therapy services to continue in SNF setting on discharge from acute care hospital in order to decrease risk for immobility, injury, falls and re-hospitalization. Pt HR up to 110 bpm during session. Spoke with MD about ordering hyperextension knee brace for pt and notified ortho tech.     Recommendations for follow up therapy are one component of a multi-disciplinary discharge planning process, led by the attending physician.  Recommendations may be updated based on patient status, additional functional criteria and insurance authorization.  Follow Up Recommendations  Skilled nursing-short term rehab (<3 hours/day) Can patient physically be transported by private vehicle: No   Assistance Recommended at Discharge Frequent or constant Supervision/Assistance  Patient can return home with the following Two people to help with walking and/or transfers;Help with stairs or ramp for entrance;Assist for transportation;Assistance with cooking/housework   Equipment Recommendations  Other (comment) (defer to post acute)    Recommendations for Other  Services       Precautions / Restrictions Precautions Precautions: Fall Precaution Comments: Airborne/Contact precs, R knee hyperext with WB intermittently Restrictions Weight Bearing Restrictions: No     Mobility  Bed Mobility Overal bed mobility: Needs Assistance Bed Mobility: Supine to Sit     Supine to sit: Supervision     General bed mobility comments: Pt moderately uses bed rail, HOB at ~15 degrees pt able to get to EOB and scoot at Supervision level. Patient Response: Cooperative  Transfers Overall transfer level: Needs assistance Equipment used: Rolling walker (2 wheels) (bariatric) Transfers: Sit to/from Stand, Bed to chair/wheelchair/BSC Sit to Stand: From elevated surface, Mod assist   Step pivot transfers: Mod assist       General transfer comment: Elevated bed to drop arm recliner. Pt requires increased time, Mod A for balance and AD navigation for transfer pt had hyperextension at the R knee 2x throughout transfer wtihout LOB but pt does report pain. Pt practiced at EOB marching, wgt shifting prior to transfer. Pt requires increased time to get to full standing from sitting EOB due to weakness in the hip/trunk extensors.    Ambulation/Gait             Pre-gait activities: Wgt shifting at EOB and marching in place. General Gait Details: Due to hyperextension due to progress gait today besides a few steps toward recliner from EOB with short step length and 2x significant ~10 degrees hyperextension at the R knee with WB.        Balance Overall balance assessment: Needs assistance Sitting-balance support: Feet supported Sitting balance-Leahy Scale: Good     Standing balance support: Bilateral upper extremity supported, Reliant on assistive device for balance Standing balance-Leahy Scale: Poor Standing balance comment:  reliant on RW and +2 external assist for safety        Cognition Arousal/Alertness: Awake/alert Behavior During Therapy: WFL  for tasks assessed/performed, Anxious Overall Cognitive Status: Within Functional Limits for tasks assessed               General Comments General comments (skin integrity, edema, etc.): Pt HR up to 110 bpm with functional activities. Improved energy this session.      Pertinent Vitals/Pain Pain Assessment Pain Assessment: No/denies pain Pain Location: Pt reports pain in the R knee with movement. No grimacing or avoidance. Pain Intervention(s): Limited activity within patient's tolerance, Monitored during session     PT Goals (current goals can now be found in the care plan section) Acute Rehab PT Goals Patient Stated Goal: pt daughter agreeable to rehab PT Goal Formulation: With patient/family Time For Goal Achievement: 11/08/22 Potential to Achieve Goals: Fair Progress towards PT goals: Progressing toward goals    Frequency    Min 2X/week      PT Plan Current plan remains appropriate       AM-PAC PT "6 Clicks" Mobility   Outcome Measure  Help needed turning from your back to your side while in a flat bed without using bedrails?: A Little Help needed moving from lying on your back to sitting on the side of a flat bed without using bedrails?: A Little Help needed moving to and from a bed to a chair (including a wheelchair)?: A Lot Help needed standing up from a chair using your arms (e.g., wheelchair or bedside chair)?: A Lot Help needed to walk in hospital room?: Total Help needed climbing 3-5 steps with a railing? : Total 6 Click Score: 12    End of Session Equipment Utilized During Treatment: Gait belt Activity Tolerance: Patient tolerated treatment well Patient left: in chair;with call bell/phone within reach;with chair alarm set Nurse Communication: Mobility status;Precautions PT Visit Diagnosis: Other abnormalities of gait and mobility (R26.89);Muscle weakness (generalized) (M62.81);History of falling (Z91.81)     Time: 0814-4818 PT Time Calculation  (min) (ACUTE ONLY): 27 min  Charges:  $Therapeutic Activity: 23-37 mins                     Tomma Rakers, DPT, CLT  Acute Rehabilitation Services Office: [phone] (Secure chat preferred)    Ander Purpura 10/31/2022, 2:53 PM

## 2022-10-31 NOTE — Progress Notes (Signed)
PROGRESS NOTE  Kathleen [name] FAO:[id number] DOB: 18-Jan-1941 DOA: 10/24/2022 PCP: Garwin Brothers, MD   LOS: 2 days   Brief Narrative / Interim history: This 82 year old female with history of hypertension, osteoarthritis, spinal stenosis and ambulatory dysfunction, chronic pain syndrome on oxycodone at home who was brought to the emergency room with weakness and confusion, low-grade fever and profound debility. She was in the emergency room on 1/25 with leg weakness and fall, skeletal survey was negative and she was discharged home. When she went home she was found to be more weak and debilitated, temperature 101 so brought back. In the emergency room hemodynamically stable. COVID-19 was positive. She was also found to have a small area of cellulitis on the right medial thigh. On room air. Admitted due to profound debility from COVID-19.  Medically clear for discharge to SNF pending 10 day quarantine for SNF.   Subjective / 24h Interval events: Still with a mild cough, overall improving.  Assesement and Plan: Principal Problem:   Acute encephalopathy in setting of covid 19 Active Problems:   Weakness   Pancytopenia (HCC)   HTN (hypertension)   COVID-19 virus infection   Neuropathic pain   S/P lumbar fusion   Acute metabolic encephalopathy  Principal problem Acute Infective encephalopathy secondary to COVID-19 infection -improved with supportive treatment, mentation appears back to baseline.  Family declined Paxlovid, she has no pulmonary symptoms, no shortness of breath or chest congestion.  She remains on room air.  Continue supportive care.  PT recommends SNF, she will need to finish isolation before she could go.  She tested positive on 10/24/2022, will come off isolation 11/03/2022. -Overall stable, on room air.  Chest x-ray yesterday with possible bibasilar atelectasis.  Incentive spirometry  Active problems Profound debility, chronic pain syndrome, spinal stenosis - Patient on chronic  pain management.  Continue long-acting oxycodone and short acting oxycodone. Mobilize with PT OT.  Continue on gabapentin and Cymbalta.  PT recommends SNF, placement pending coming off isolation   Right medial thigh cellulitis -There is no evidence of obvious abscess or collections.  Status post 5 days of Keflex   Essential hypertension -continue regimen with enalapril, HCTZ, metoprolol.  Blood pressure is stable   Pancytopenia -recheck labs tomorrow morning  Scheduled Meds:  vitamin C  500 mg Oral Daily   atorvastatin  10 mg Oral Daily   clopidogrel  75 mg Oral Daily   docusate sodium  100 mg Oral QHS   DULoxetine  60 mg Oral Daily   enoxaparin (LOVENOX) injection  40 mg Subcutaneous Q24H   gabapentin  400 mg Oral TID   metoprolol succinate  25 mg Oral Daily   oxyCODONE  30 mg Oral Q12H   polyethylene glycol  17 g Oral Daily   zinc sulfate  220 mg Oral Daily   Continuous Infusions: PRN Meds:.acetaminophen **OR** acetaminophen, albuterol, guaiFENesin-dextromethorphan, oxyCODONE  Current Outpatient Medications  Medication Instructions   acetaminophen (TYLENOL) 650 mg, Oral, Every 6 hours PRN   ammonium lactate (LAC-HYDRIN) 12 % cream 1 Application, Topical, As needed   ascorbic acid (VITAMIN C) 500 mg, Oral, 2 times daily   atorvastatin (LIPITOR) 10 mg, Oral, Daily   cephALEXin (KEFLEX) 500 mg, Oral, 4 times daily   Cholecalciferol 1,000 Units, Oral, Daily   clopidogrel (PLAVIX) 75 mg, Oral, Daily   docusate sodium (COLACE) 100 mg, Oral, Nightly   DULoxetine (CYMBALTA) 60 mg, Oral, Daily   enalapril (VASOTEC) 20 mg, Oral, 2 times daily   gabapentin (  NEURONTIN) 400 mg, Oral, 3 times daily   hydrochlorothiazide (HYDRODIURIL) 50 mg, Oral, Daily   loratadine (CLARITIN) 10 mg, Oral, Daily   metoprolol succinate (TOPROL-XL) 100 mg, Oral, Every evening, Take with or immediately following a meal.   Multiple Vitamins-Minerals (MULTIVITAMIN WITH MINERALS) tablet 1 tablet, Oral, Daily    oxyCODONE (OXY IR/ROXICODONE) 5 mg, Oral, Every 6 hours PRN   OxyCONTIN 30 mg, Oral, 2 times daily    Diet Orders (From admission, onward)     Start     Ordered   10/25/22 0119  Diet Heart Room service appropriate? Yes; Fluid consistency: Thin  Diet effective now       Question Answer Comment  Room service appropriate? Yes   Fluid consistency: Thin      10/25/22 0120            DVT prophylaxis: enoxaparin (LOVENOX) injection 40 mg Start: 10/25/22 1000   Lab Results  Component Value Date   PLT 159 10/29/2022      Code Status: Full Code  Family Communication: no family at bedside   Status is: Inpatient  Remains inpatient appropriate because: waiting SNF  Level of care: Med-Surg  Consultants:  none  Objective: Vitals:   10/29/22 2207 10/30/22 0637 10/30/22 0830 10/30/22 2225  BP: 117/68 128/62 (!) 102/55 (!) 113/58  Pulse: 72 76 80 87  Resp: 16 12 15 12   Temp: 98 F (36.7 C) 98.1 F (36.7 C) 98.1 F (36.7 C) 98.1 F (36.7 C)  TempSrc: Oral Oral  Oral  SpO2: 100% 99% 94% 94%   No intake or output data in the 24 hours ending 10/31/22 1100  Wt Readings from Last 3 Encounters:  No data found for Wt    Examination:  Constitutional: NAD Eyes: lids and conjunctivae normal, no scleral icterus ENMT: mmm Neck: normal, supple Respiratory: clear to auscultation bilaterally, no wheezing, no crackles. Normal respiratory effort.  Cardiovascular: Regular rate and rhythm, no murmurs / rubs / gallops. No LE edema. Abdomen: soft, no distention, no tenderness. Bowel sounds positive.    Data Reviewed: I have independently reviewed following labs and imaging studies  CBC Recent Labs  Lab 10/25/22 0031 10/26/22 0341 10/27/22 0345 10/28/22 0507 10/29/22 0112  WBC 3.0* 2.9* 3.0* 3.8* 4.1  HGB 11.0* 11.5* 11.6* 11.6* 12.6  HCT 34.1* 35.2* 34.5* 35.2* 38.3  PLT 135* 130* 125* 136* 159  MCV 92.7 90.0 89.8 90.5 89.5  MCH 29.9 29.4 30.2 29.8 29.4  MCHC 32.3 32.7  33.6 33.0 32.9  RDW 15.1 14.7 14.7 14.5 14.4  LYMPHSABS 0.6* 1.1 1.5 1.8 1.8  MONOABS 0.7 0.5 0.4 0.5 0.5  EOSABS 0.0 0.0 0.1 0.1 0.1  BASOSABS 0.0 0.0 0.0 0.0 0.0     Recent Labs  Lab 10/25/22 0031 10/25/22 0154 10/26/22 0341 10/27/22 0345 10/28/22 0507 10/29/22 0112  NA 135  --  135 135 136 135  K 3.8  --  3.5 3.8 3.8 4.0  CL 99  --  98 97* 97* 94*  CO2 29  --  32 28 32 33*  GLUCOSE 113*  --  108* 91 100* 101*  BUN 12  --  10 11 14 19   CREATININE 0.74  --  0.73 0.76 0.75 1.00  CALCIUM 8.5*  --  8.2* 8.3* 8.6* 8.8*  AST 41  --  35 30 33 40  ALT 19  --  19 18 19 22   ALKPHOS 57  --  56 54 61 67  BILITOT  0.6  --  0.5 0.4 0.5 0.5  ALBUMIN 2.9*  --  2.6* 2.6* 2.5* 2.8*  MG  --  1.7  --   --   --   --   CRP  --  4.3*  --   --   --   --   DDIMER  --  1.99*  --   --   --   --   PROCALCITON  --  <0.10  --   --   --   --   TSH 0.225*  --   --   --   --   --   AMMONIA 34  --   --   --   --   --   BNP  --  298.7*  --   --   --   --      ------------------------------------------------------------------------------------------------------------------ No results for input(s): "CHOL", "HDL", "LDLCALC", "TRIG", "CHOLHDL", "LDLDIRECT" in the last 72 hours.  No results found for: "HGBA1C" ------------------------------------------------------------------------------------------------------------------ No results for input(s): "TSH", "T4TOTAL", "T3FREE", "THYROIDAB" in the last 72 hours.  Invalid input(s): "FREET3"  Cardiac Enzymes No results for input(s): "CKMB", "TROPONINI", "MYOGLOBIN" in the last 168 hours.  Invalid input(s): "CK" ------------------------------------------------------------------------------------------------------------------    Component Value Date/Time   BNP 298.7 (H) 10/25/2022 0154    CBG: No results for input(s): "GLUCAP" in the last 168 hours.  Recent Results (from the past 240 hour(s))  Resp panel by RT-PCR (RSV, Flu A&B, Covid) Anterior Nasal  Swab     Status: Abnormal   Collection Time: 10/24/22  2:55 PM   Specimen: Anterior Nasal Swab  Result Value Ref Range Status   SARS Coronavirus 2 by RT PCR POSITIVE (A) NEGATIVE Final    Comment: (NOTE) SARS-CoV-2 target nucleic acids are DETECTED.  The SARS-CoV-2 RNA is generally detectable in upper respiratory specimens during the acute phase of infection. Positive results are indicative of the presence of the identified virus, but do not rule out bacterial infection or co-infection with other pathogens not detected by the test. Clinical correlation with patient history and other diagnostic information is necessary to determine patient infection status. The expected result is Negative.  Fact Sheet for Patients: BloggerCourse.com  Fact Sheet for Healthcare Providers: SeriousBroker.it  This test is not yet approved or cleared by the [place] FDA and  has been authorized for detection and/or diagnosis of SARS-CoV-2 by FDA under an Emergency Use Authorization (EUA).  This EUA will remain in effect (meaning this test can be used) for the duration of  the COVID-19 declaration under Section 564(b)(1) of the A ct, 21 U.S.C. section 360bbb-3(b)(1), unless the authorization is terminated or revoked sooner.     Influenza A by PCR NEGATIVE NEGATIVE Final   Influenza B by PCR NEGATIVE NEGATIVE Final    Comment: (NOTE) The Xpert Xpress SARS-CoV-2/FLU/RSV plus assay is intended as an aid in the diagnosis of influenza from Nasopharyngeal swab specimens and should not be used as a sole basis for treatment. Nasal washings and aspirates are unacceptable for Xpert Xpress SARS-CoV-2/FLU/RSV testing.  Fact Sheet for Patients: BloggerCourse.com  Fact Sheet for Healthcare Providers: SeriousBroker.it  This test is not yet approved or cleared by the [place] FDA and has been authorized  for detection and/or diagnosis of SARS-CoV-2 by FDA under an Emergency Use Authorization (EUA). This EUA will remain in effect (meaning this test can be used) for the duration of the COVID-19 declaration under Section 564(b)(1) of the Act, 21 U.S.C. section 360bbb-3(b)(1), unless  the authorization is terminated or revoked.     Resp Syncytial Virus by PCR NEGATIVE NEGATIVE Final    Comment: (NOTE) Fact Sheet for Patients: EntrepreneurPulse.com.au  Fact Sheet for Healthcare Providers: IncredibleEmployment.be  This test is not yet approved or cleared by the [place] FDA and has been authorized for detection and/or diagnosis of SARS-CoV-2 by FDA under an Emergency Use Authorization (EUA). This EUA will remain in effect (meaning this test can be used) for the duration of the COVID-19 declaration under Section 564(b)(1) of the Act, 21 U.S.C. section 360bbb-3(b)(1), unless the authorization is terminated or revoked.  Performed at [facility], [place] [address] [place], [place] [address]   Culture, blood (Routine X 2) w Reflex to ID Panel     Status: None   Collection Time: 10/24/22 11:50 PM   Specimen: BLOOD  Result Value Ref Range Status   Specimen Description BLOOD SITE NOT SPECIFIED  Final   Special Requests   Final    BOTTLES DRAWN AEROBIC AND ANAEROBIC Blood Culture results may not be optimal due to an excessive volume of blood received in culture bottles   Culture   Final    NO GROWTH 5 DAYS Performed at [facility], [place] [address] [place], [place] [address]    Report Status 10/30/2022 FINAL  Final  Culture, blood (Routine X 2) w Reflex to ID Panel     Status: None   Collection Time: 10/24/22 11:55 PM   Specimen: BLOOD  Result Value Ref Range Status   Specimen Description BLOOD SITE NOT SPECIFIED  Final   Special Requests   Final    BOTTLES DRAWN AEROBIC AND ANAEROBIC Blood Culture results may not be optimal due to an  excessive volume of blood received in culture bottles   Culture   Final    NO GROWTH 5 DAYS Performed at [facility], [place] [address] [place], [place] [address]    Report Status 10/30/2022 FINAL  Final  SARS CORONAVIRUS 2 (TAT 6-24 HRS) Anterior Nasal Swab     Status: Abnormal   Collection Time: 10/29/22 12:00 PM   Specimen: Anterior Nasal Swab  Result Value Ref Range Status   SARS Coronavirus 2 POSITIVE (A) NEGATIVE Final    Comment: (NOTE) SARS-CoV-2 target nucleic acids are DETECTED.  The SARS-CoV-2 RNA is generally detectable in upper and lower respiratory specimens during the acute phase of infection. Positive results are indicative of the presence of SARS-CoV-2 RNA. Clinical correlation with patient history and other diagnostic information is  necessary to determine patient infection status. Positive results do not rule out bacterial infection or co-infection with other viruses.  The expected result is Negative.  Fact Sheet for Patients: SugarRoll.be  Fact Sheet for Healthcare Providers: https://www.woods-mathews.com/  This test is not yet approved or cleared by the [place] FDA and  has been authorized for detection and/or diagnosis of SARS-CoV-2 by FDA under an Emergency Use Authorization (EUA). This EUA will remain  in effect (meaning this test can be used) for the duration of the COVID-19 declaration under Section 564(b)(1) of the Act, 21 U. S.C. section 360bbb-3(b)(1), unless the authorization is terminated or revoked sooner.   Performed at [facility], [place] [address], [place], [place] [address]      Radiology Studies: No results found.   Marzetta Board, MD, PhD [facility]  Between 7 am - 7 pm I am available, please contact me via [place] (for emergencies) or [place] (  non urgent messages)  Between 7 pm - 7 am I am not available, please contact night coverage MD/APP via Amion

## 2022-10-31 NOTE — Plan of Care (Signed)
  Problem: Health Behavior/Discharge Planning: Goal: Ability to manage health-related needs will improve Outcome: Progressing   Problem: Activity: Goal: Risk for activity intolerance will decrease Outcome: Progressing   Problem: Nutrition: Goal: Adequate nutrition will be maintained Outcome: Progressing   

## 2022-11-01 DIAGNOSIS — G9340 Encephalopathy, unspecified: Secondary | ICD-10-CM | POA: Diagnosis not present

## 2022-11-01 LAB — COMPREHENSIVE METABOLIC PANEL
ALT: 25 U/L (ref 0–44)
AST: 33 U/L (ref 15–41)
Albumin: 2.7 g/dL — ABNORMAL LOW (ref 3.5–5.0)
Alkaline Phosphatase: 65 U/L (ref 38–126)
Anion gap: 8 (ref 5–15)
BUN: 21 mg/dL (ref 8–23)
CO2: 30 mmol/L (ref 22–32)
Calcium: 8.6 mg/dL — ABNORMAL LOW (ref 8.9–10.3)
Chloride: 99 mmol/L (ref 98–111)
Creatinine, Ser: 0.78 mg/dL (ref 0.44–1.00)
GFR, Estimated: >60 mL/min (ref >60)
Glucose, Bld: 108 mg/dL — ABNORMAL HIGH (ref 70–99)
Potassium: 3.8 mmol/L (ref 3.5–5.1)
Sodium: 137 mmol/L (ref 135–145)
Total Bilirubin: 0.5 mg/dL (ref 0.3–1.2)
Total Protein: 6.9 g/dL (ref 6.5–8.1)

## 2022-11-01 LAB — CBC
HCT: 36.4 % (ref 36.0–46.0)
Hemoglobin: 11.4 g/dL — ABNORMAL LOW (ref 12.0–15.0)
MCH: 28.9 pg (ref 26.0–34.0)
MCHC: 31.3 g/dL (ref 30.0–36.0)
MCV: 92.4 fL (ref 80.0–100.0)
Platelets: 188 10*3/uL (ref 150–400)
RBC: 3.94 MIL/uL (ref 3.87–5.11)
RDW: 14.5 % (ref 11.5–15.5)
WBC: 5.7 10*3/uL (ref 4.0–10.5)
nRBC: 0 % (ref 0.0–0.2)

## 2022-11-01 LAB — MAGNESIUM: Magnesium: 2.2 mg/dL (ref 1.7–2.4)

## 2022-11-01 NOTE — Progress Notes (Signed)
PROGRESS NOTE  Kathleen [name]:[id number] DOB: [birth year] DOA: 10/24/2022 PCP: Garwin Brothers, MD   LOS: 3 days   Brief Narrative / Interim history: This 82 year old female with history of hypertension, osteoarthritis, spinal stenosis and ambulatory dysfunction, chronic pain syndrome on oxycodone at home who was brought to the emergency room with weakness and confusion, low-grade fever and profound debility. She was in the emergency room on 1/25 with leg weakness and fall, skeletal survey was negative and she was discharged home. When she went home she was found to be more weak and debilitated, temperature 101 so brought back. In the emergency room hemodynamically stable. COVID-19 was positive. She was also found to have a small area of cellulitis on the right medial thigh. On room air. Admitted due to profound debility from COVID-19.  Medically clear for discharge to SNF pending 10 day quarantine for SNF.   Subjective / 24h Interval events: She has no complaints today, is doing overall very well.  Plan for SNF on Monday  Assesement and Plan: Principal Problem:   Acute encephalopathy in setting of covid 19 Active Problems:   Weakness   Pancytopenia ([place])   HTN (hypertension)   COVID-19 virus infection   Neuropathic pain   S/P lumbar fusion   Acute metabolic encephalopathy  Principal problem Acute Infective encephalopathy secondary to COVID-19 infection -improved with supportive treatment, mentation appears back to baseline.  Family declined Paxlovid, she has no pulmonary symptoms, no shortness of breath or chest congestion.  She remains on room air.  Continue supportive care.  PT recommends SNF, she will need to finish isolation before she could go.  She tested positive on 10/24/2022, will come off isolation 11/03/2022. -Overall stable, on room air.  Chest x-ray yesterday with possible bibasilar atelectasis.  Incentive spirometry  Active problems Profound debility, chronic pain syndrome,  spinal stenosis - Patient on chronic pain management.  Continue long-acting oxycodone and short acting oxycodone. Mobilize with PT OT.  Continue on gabapentin and Cymbalta.  PT recommends SNF, placement pending coming off isolation   Right medial thigh cellulitis -There is no evidence of obvious abscess or collections.  Status post 5 days of Keflex   Essential hypertension -continue regimen with enalapril, HCTZ, metoprolol.  Blood pressure is stable   Pancytopenia -recheck labs tomorrow morning  Scheduled Meds:  vitamin C  500 mg Oral Daily   atorvastatin  10 mg Oral Daily   clopidogrel  75 mg Oral Daily   docusate sodium  100 mg Oral QHS   DULoxetine  60 mg Oral Daily   enoxaparin (LOVENOX) injection  40 mg Subcutaneous Q24H   gabapentin  400 mg Oral TID   metoprolol succinate  25 mg Oral Daily   oxyCODONE  30 mg Oral Q12H   polyethylene glycol  17 g Oral Daily   zinc sulfate  220 mg Oral Daily   Continuous Infusions: PRN Meds:.acetaminophen **OR** acetaminophen, albuterol, guaiFENesin-dextromethorphan, oxyCODONE  Current Outpatient Medications  Medication Instructions   acetaminophen (TYLENOL) 650 mg, Oral, Every 6 hours PRN   ammonium lactate (LAC-HYDRIN) 12 % cream 1 Application, Topical, As needed   ascorbic acid (VITAMIN C) 500 mg, Oral, 2 times daily   atorvastatin (LIPITOR) 10 mg, Oral, Daily   cephALEXin (KEFLEX) 500 mg, Oral, 4 times daily   Cholecalciferol 1,000 Units, Oral, Daily   clopidogrel (PLAVIX) 75 mg, Oral, Daily   docusate sodium (COLACE) 100 mg, Oral, Nightly   DULoxetine (CYMBALTA) 60 mg, Oral, Daily   enalapril (VASOTEC)  20 mg, Oral, 2 times daily   gabapentin (NEURONTIN) 400 mg, Oral, 3 times daily   hydrochlorothiazide (HYDRODIURIL) 50 mg, Oral, Daily   loratadine (CLARITIN) 10 mg, Oral, Daily   metoprolol succinate (TOPROL-XL) 100 mg, Oral, Every evening, Take with or immediately following a meal.   Multiple Vitamins-Minerals (MULTIVITAMIN WITH  MINERALS) tablet 1 tablet, Oral, Daily   oxyCODONE (OXY IR/ROXICODONE) 5 mg, Oral, Every 6 hours PRN   OxyCONTIN 30 mg, Oral, 2 times daily    Diet Orders (From admission, onward)     Start     Ordered   10/25/22 0119  Diet Heart Room service appropriate? Yes; Fluid consistency: Thin  Diet effective now       Question Answer Comment  Room service appropriate? Yes   Fluid consistency: Thin      10/25/22 0120            DVT prophylaxis: enoxaparin (LOVENOX) injection 40 mg Start: 10/25/22 1000   Lab Results  Component Value Date   PLT 188 11/01/2022      Code Status: Full Code  Family Communication: no family at bedside   Status is: Inpatient  Remains inpatient appropriate because: waiting SNF  Level of care: Med-Surg  Consultants:  none  Objective: Vitals:   10/30/22 2225 10/31/22 2233 11/01/22 0336 11/01/22 0926  BP: (!) 113/58 115/70 107/65 116/68  Pulse: 87 86 74   Resp: 12 20 14    Temp: 98.1 F (36.7 C) 97.9 F (36.6 C)  98.2 F (36.8 C)  TempSrc: Oral Oral  Oral  SpO2: 94% 92% 99%    No intake or output data in the 24 hours ending 11/01/22 1111  Wt Readings from Last 3 Encounters:  No data found for Wt    Examination:  Constitutional: NAD Eyes: lids and conjunctivae normal, no scleral icterus ENMT: mmm Neck: normal, supple Respiratory: clear to auscultation bilaterally, no wheezing, no crackles. Normal respiratory effort.  Cardiovascular: Regular rate and rhythm, no murmurs / rubs / gallops. No LE edema. Abdomen: soft, no distention, no tenderness. Bowel sounds positive.  Skin: no rashes  Data Reviewed: I have independently reviewed following labs and imaging studies  CBC Recent Labs  Lab 10/26/22 0341 10/27/22 0345 10/28/22 0507 10/29/22 0112 11/01/22 0251  WBC 2.9* 3.0* 3.8* 4.1 5.7  HGB 11.5* 11.6* 11.6* 12.6 11.4*  HCT 35.2* 34.5* 35.2* 38.3 36.4  PLT 130* 125* 136* 159 188  MCV 90.0 89.8 90.5 89.5 92.4  MCH 29.4 30.2 29.8  29.4 28.9  MCHC 32.7 33.6 33.0 32.9 31.3  RDW 14.7 14.7 14.5 14.4 14.5  LYMPHSABS 1.1 1.5 1.8 1.8  --   MONOABS 0.5 0.4 0.5 0.5  --   EOSABS 0.0 0.1 0.1 0.1  --   BASOSABS 0.0 0.0 0.0 0.0  --      Recent Labs  Lab 10/26/22 0341 10/27/22 0345 10/28/22 0507 10/29/22 0112 11/01/22 0251  NA 135 135 136 135 137  K 3.5 3.8 3.8 4.0 3.8  CL 98 97* 97* 94* 99  CO2 32 28 32 33* 30  GLUCOSE 108* 91 100* 101* 108*  BUN 10 11 14 19 21   CREATININE 0.73 0.76 0.75 1.00 0.78  CALCIUM 8.2* 8.3* 8.6* 8.8* 8.6*  AST 35 30 33 40 33  ALT 19 18 19 22 25   ALKPHOS 56 54 61 67 65  BILITOT 0.5 0.4 0.5 0.5 0.5  ALBUMIN 2.6* 2.6* 2.5* 2.8* 2.7*  MG  --   --   --   --  2.2     ------------------------------------------------------------------------------------------------------------------ No results for input(s): "CHOL", "HDL", "LDLCALC", "TRIG", "CHOLHDL", "LDLDIRECT" in the last 72 hours.  No results found for: "HGBA1C" ------------------------------------------------------------------------------------------------------------------ No results for input(s): "TSH", "T4TOTAL", "T3FREE", "THYROIDAB" in the last 72 hours.  Invalid input(s): "FREET3"  Cardiac Enzymes No results for input(s): "CKMB", "TROPONINI", "MYOGLOBIN" in the last 168 hours.  Invalid input(s): "CK" ------------------------------------------------------------------------------------------------------------------    Component Value Date/Time   BNP 298.7 (H) 10/25/2022 0154    CBG: No results for input(s): "GLUCAP" in the last 168 hours.  Recent Results (from the past 240 hour(s))  Resp panel by RT-PCR (RSV, Flu A&B, Covid) Anterior Nasal Swab     Status: Abnormal   Collection Time: 10/24/22  2:55 PM   Specimen: Anterior Nasal Swab  Result Value Ref Range Status   SARS Coronavirus 2 by RT PCR POSITIVE (A) NEGATIVE Final    Comment: (NOTE) SARS-CoV-2 target nucleic acids are DETECTED.  The SARS-CoV-2 RNA is  generally detectable in upper respiratory specimens during the acute phase of infection. Positive results are indicative of the presence of the identified virus, but do not rule out bacterial infection or co-infection with other pathogens not detected by the test. Clinical correlation with patient history and other diagnostic information is necessary to determine patient infection status. The expected result is Negative.  Fact Sheet for Patients: EntrepreneurPulse.com.au  Fact Sheet for Healthcare Providers: IncredibleEmployment.be  This test is not yet approved or cleared by the [place] FDA and  has been authorized for detection and/or diagnosis of SARS-CoV-2 by FDA under an Emergency Use Authorization (EUA).  This EUA will remain in effect (meaning this test can be used) for the duration of  the COVID-19 declaration under Section 564(b)(1) of the A ct, 21 U.S.C. section 360bbb-3(b)(1), unless the authorization is terminated or revoked sooner.     Influenza A by PCR NEGATIVE NEGATIVE Final   Influenza B by PCR NEGATIVE NEGATIVE Final    Comment: (NOTE) The Xpert Xpress SARS-CoV-2/FLU/RSV plus assay is intended as an aid in the diagnosis of influenza from Nasopharyngeal swab specimens and should not be used as a sole basis for treatment. Nasal washings and aspirates are unacceptable for Xpert Xpress SARS-CoV-2/FLU/RSV testing.  Fact Sheet for Patients: EntrepreneurPulse.com.au  Fact Sheet for Healthcare Providers: IncredibleEmployment.be  This test is not yet approved or cleared by the [place] FDA and has been authorized for detection and/or diagnosis of SARS-CoV-2 by FDA under an Emergency Use Authorization (EUA). This EUA will remain in effect (meaning this test can be used) for the duration of the COVID-19 declaration under Section 564(b)(1) of the Act, 21 U.S.C. section 360bbb-3(b)(1),  unless the authorization is terminated or revoked.     Resp Syncytial Virus by PCR NEGATIVE NEGATIVE Final    Comment: (NOTE) Fact Sheet for Patients: EntrepreneurPulse.com.au  Fact Sheet for Healthcare Providers: IncredibleEmployment.be  This test is not yet approved or cleared by the [place] FDA and has been authorized for detection and/or diagnosis of SARS-CoV-2 by FDA under an Emergency Use Authorization (EUA). This EUA will remain in effect (meaning this test can be used) for the duration of the COVID-19 declaration under Section 564(b)(1) of the Act, 21 U.S.C. section 360bbb-3(b)(1), unless the authorization is terminated or revoked.  Performed at [facility], [place] [address] [place], [place] [address]   Culture, blood (Routine X 2) w Reflex to ID Panel     Status: None   Collection Time: 10/24/22 11:50 PM  Specimen: BLOOD  Result Value Ref Range Status   Specimen Description BLOOD SITE NOT SPECIFIED  Final   Special Requests   Final    BOTTLES DRAWN AEROBIC AND ANAEROBIC Blood Culture results may not be optimal due to an excessive volume of blood received in culture bottles   Culture   Final    NO GROWTH 5 DAYS Performed at [facility], [address], [place], [place] [address]    Report Status 10/30/2022 FINAL  Final  Culture, blood (Routine X 2) w Reflex to ID Panel     Status: None   Collection Time: 10/24/22 11:55 PM   Specimen: BLOOD  Result Value Ref Range Status   Specimen Description BLOOD SITE NOT SPECIFIED  Final   Special Requests   Final    BOTTLES DRAWN AEROBIC AND ANAEROBIC Blood Culture results may not be optimal due to an excessive volume of blood received in culture bottles   Culture   Final    NO GROWTH 5 DAYS Performed at [facility], [address], [place], [place] [address]    Report Status 10/30/2022 FINAL  Final  SARS CORONAVIRUS 2 (TAT 6-24 HRS) Anterior Nasal Swab      Status: Abnormal   Collection Time: 10/29/22 12:00 PM   Specimen: Anterior Nasal Swab  Result Value Ref Range Status   SARS Coronavirus 2 POSITIVE (A) NEGATIVE Final    Comment: (NOTE) SARS-CoV-2 target nucleic acids are DETECTED.  The SARS-CoV-2 RNA is generally detectable in upper and lower respiratory specimens during the acute phase of infection. Positive results are indicative of the presence of SARS-CoV-2 RNA. Clinical correlation with patient history and other diagnostic information is  necessary to determine patient infection status. Positive results do not rule out bacterial infection or co-infection with other viruses.  The expected result is Negative.  Fact Sheet for Patients: HairSlick.no  Fact Sheet for Healthcare Providers: quierodirigir.com  This test is not yet approved or cleared by the [place] FDA and  has been authorized for detection and/or diagnosis of SARS-CoV-2 by FDA under an Emergency Use Authorization (EUA). This EUA will remain  in effect (meaning this test can be used) for the duration of the COVID-19 declaration under Section 564(b)(1) of the Act, 21 U. S.C. section 360bbb-3(b)(1), unless the authorization is terminated or revoked sooner.   Performed at [facility], [address], [place], [place] [address]      Radiology Studies: No results found.   Pamella Pert, MD, PhD [facility]  Between 7 am - 7 pm I am available, please contact me via Amion (for emergencies) or Securechat (non urgent messages)  Between 7 pm - 7 am I am not available, please contact night coverage MD/APP via Amion

## 2022-11-02 DIAGNOSIS — G9340 Encephalopathy, unspecified: Secondary | ICD-10-CM | POA: Diagnosis not present

## 2022-11-02 NOTE — Progress Notes (Signed)
PROGRESS NOTE  Kathleen [name] WUJ:[id number] DOB: 1941/05/24 DOA: 10/24/2022 PCP: Garwin Brothers, MD   LOS: 4 days   Brief Narrative / Interim history: This 82 year old female with history of hypertension, osteoarthritis, spinal stenosis and ambulatory dysfunction, chronic pain syndrome on oxycodone at home who was brought to the emergency room with weakness and confusion, low-grade fever and profound debility. She was in the emergency room on 1/25 with leg weakness and fall, skeletal survey was negative and she was discharged home. When she went home she was found to be more weak and debilitated, temperature 101 so brought back. In the emergency room hemodynamically stable. COVID-19 was positive. She was also found to have a small area of cellulitis on the right medial thigh. On room air. Admitted due to profound debility from COVID-19.  Medically clear for discharge to SNF pending 10 day quarantine for SNF.   Subjective / 24h Interval events: No complaints, doing well.  Awaiting SNF tomorrow Assesement and Plan: Principal Problem:   Acute encephalopathy in setting of covid 19 Active Problems:   Weakness   Pancytopenia (HCC)   HTN (hypertension)   COVID-19 virus infection   Neuropathic pain   S/P lumbar fusion   Acute metabolic encephalopathy  Principal problem Acute Infective encephalopathy secondary to COVID-19 infection -improved with supportive treatment, mentation appears back to baseline.  Family declined Paxlovid, she has no pulmonary symptoms, no shortness of breath or chest congestion.  She remains on room air.  Continue supportive care.  PT recommends SNF, she will need to finish isolation before she could go.  She tested positive on 10/24/2022, will come off isolation 11/03/2022. -Overall stable, on room air.  Chest x-ray with possible bibasilar atelectasis.  Incentive spirometry  Active problems Profound debility, chronic pain syndrome, spinal stenosis - Patient on chronic pain  management.  Continue long-acting oxycodone and short acting oxycodone. Mobilize with PT OT.  Continue on gabapentin and Cymbalta.  PT recommends SNF, placement pending coming off isolation   Right medial thigh cellulitis -There is no evidence of obvious abscess or collections.  Status post 5 days of Keflex   Essential hypertension -continue regimen with enalapril, HCTZ, metoprolol.  Blood pressure is stable   Pancytopenia -recheck labs tomorrow morning  Scheduled Meds:  vitamin C  500 mg Oral Daily   atorvastatin  10 mg Oral Daily   clopidogrel  75 mg Oral Daily   docusate sodium  100 mg Oral QHS   DULoxetine  60 mg Oral Daily   enoxaparin (LOVENOX) injection  40 mg Subcutaneous Q24H   gabapentin  400 mg Oral TID   metoprolol succinate  25 mg Oral Daily   oxyCODONE  30 mg Oral Q12H   polyethylene glycol  17 g Oral Daily   zinc sulfate  220 mg Oral Daily   Continuous Infusions: PRN Meds:.acetaminophen **OR** acetaminophen, albuterol, guaiFENesin-dextromethorphan, oxyCODONE  Current Outpatient Medications  Medication Instructions   acetaminophen (TYLENOL) 650 mg, Oral, Every 6 hours PRN   ammonium lactate (LAC-HYDRIN) 12 % cream 1 Application, Topical, As needed   ascorbic acid (VITAMIN C) 500 mg, Oral, 2 times daily   atorvastatin (LIPITOR) 10 mg, Oral, Daily   cephALEXin (KEFLEX) 500 mg, Oral, 4 times daily   Cholecalciferol 1,000 Units, Oral, Daily   clopidogrel (PLAVIX) 75 mg, Oral, Daily   docusate sodium (COLACE) 100 mg, Oral, Nightly   DULoxetine (CYMBALTA) 60 mg, Oral, Daily   enalapril (VASOTEC) 20 mg, Oral, 2 times daily   gabapentin (NEURONTIN)  400 mg, Oral, 3 times daily   hydrochlorothiazide (HYDRODIURIL) 50 mg, Oral, Daily   loratadine (CLARITIN) 10 mg, Oral, Daily   metoprolol succinate (TOPROL-XL) 100 mg, Oral, Every evening, Take with or immediately following a meal.   Multiple Vitamins-Minerals (MULTIVITAMIN WITH MINERALS) tablet 1 tablet, Oral, Daily    oxyCODONE (OXY IR/ROXICODONE) 5 mg, Oral, Every 6 hours PRN   OxyCONTIN 30 mg, Oral, 2 times daily    Diet Orders (From admission, onward)     Start     Ordered   10/25/22 0119  Diet Heart Room service appropriate? Yes; Fluid consistency: Thin  Diet effective now       Question Answer Comment  Room service appropriate? Yes   Fluid consistency: Thin      10/25/22 0120            DVT prophylaxis: enoxaparin (LOVENOX) injection 40 mg Start: 10/25/22 1000   Lab Results  Component Value Date   PLT 188 11/01/2022      Code Status: Full Code  Family Communication: no family at bedside   Status is: Inpatient  Remains inpatient appropriate because: waiting SNF  Level of care: Med-Surg  Consultants:  none  Objective: Vitals:   11/01/22 1918 11/01/22 2207 11/02/22 0053 11/02/22 0332  BP: (!) 87/59 102/69 (!) 89/55 101/64  Pulse:      Resp: 13 11 17 13   Temp:      TempSrc:  Oral    SpO2:        Intake/Output Summary (Last 24 hours) at 11/02/2022 0933 Last data filed at 11/02/2022 0114 Gross per 24 hour  Intake 240 ml  Output 500 ml  Net -260 ml    Wt Readings from Last 3 Encounters:  No data found for Wt    Examination:  Constitutional: NAD Respiratory: CTA Cardiovascular: RRR  Data Reviewed: I have independently reviewed following labs and imaging studies  CBC Recent Labs  Lab 10/27/22 0345 10/28/22 0507 10/29/22 0112 11/01/22 0251  WBC 3.0* 3.8* 4.1 5.7  HGB 11.6* 11.6* 12.6 11.4*  HCT 34.5* 35.2* 38.3 36.4  PLT 125* 136* 159 188  MCV 89.8 90.5 89.5 92.4  MCH 30.2 29.8 29.4 28.9  MCHC 33.6 33.0 32.9 31.3  RDW 14.7 14.5 14.4 14.5  LYMPHSABS 1.5 1.8 1.8  --   MONOABS 0.4 0.5 0.5  --   EOSABS 0.1 0.1 0.1  --   BASOSABS 0.0 0.0 0.0  --      Recent Labs  Lab 10/27/22 0345 10/28/22 0507 10/29/22 0112 11/01/22 0251  NA 135 136 135 137  K 3.8 3.8 4.0 3.8  CL 97* 97* 94* 99  CO2 28 32 33* 30  GLUCOSE 91 100* 101* 108*  BUN 11 14 19 21    CREATININE 0.76 0.75 1.00 0.78  CALCIUM 8.3* 8.6* 8.8* 8.6*  AST 30 33 40 33  ALT 18 19 22 25   ALKPHOS 54 61 67 65  BILITOT 0.4 0.5 0.5 0.5  ALBUMIN 2.6* 2.5* 2.8* 2.7*  MG  --   --   --  2.2     ------------------------------------------------------------------------------------------------------------------ No results for input(s): "CHOL", "HDL", "LDLCALC", "TRIG", "CHOLHDL", "LDLDIRECT" in the last 72 hours.  No results found for: "HGBA1C" ------------------------------------------------------------------------------------------------------------------ No results for input(s): "TSH", "T4TOTAL", "T3FREE", "THYROIDAB" in the last 72 hours.  Invalid input(s): "FREET3"  Cardiac Enzymes No results for input(s): "CKMB", "TROPONINI", "MYOGLOBIN" in the last 168 hours.  Invalid input(s): "CK" ------------------------------------------------------------------------------------------------------------------    Component Value Date/Time  BNP 298.7 (H) 10/25/2022 0154    CBG: No results for input(s): "GLUCAP" in the last 168 hours.  Recent Results (from the past 240 hour(s))  Resp panel by RT-PCR (RSV, Flu A&B, Covid) Anterior Nasal Swab     Status: Abnormal   Collection Time: 10/24/22  2:55 PM   Specimen: Anterior Nasal Swab  Result Value Ref Range Status   SARS Coronavirus 2 by RT PCR POSITIVE (A) NEGATIVE Final    Comment: (NOTE) SARS-CoV-2 target nucleic acids are DETECTED.  The SARS-CoV-2 RNA is generally detectable in upper respiratory specimens during the acute phase of infection. Positive results are indicative of the presence of the identified virus, but do not rule out bacterial infection or co-infection with other pathogens not detected by the test. Clinical correlation with patient history and other diagnostic information is necessary to determine patient infection status. The expected result is Negative.  Fact Sheet for  Patients: EntrepreneurPulse.com.au  Fact Sheet for Healthcare Providers: IncredibleEmployment.be  This test is not yet approved or cleared by the [place] FDA and  has been authorized for detection and/or diagnosis of SARS-CoV-2 by FDA under an Emergency Use Authorization (EUA).  This EUA will remain in effect (meaning this test can be used) for the duration of  the COVID-19 declaration under Section 564(b)(1) of the A ct, 21 U.S.C. section 360bbb-3(b)(1), unless the authorization is terminated or revoked sooner.     Influenza A by PCR NEGATIVE NEGATIVE Final   Influenza B by PCR NEGATIVE NEGATIVE Final    Comment: (NOTE) The Xpert Xpress SARS-CoV-2/FLU/RSV plus assay is intended as an aid in the diagnosis of influenza from Nasopharyngeal swab specimens and should not be used as a sole basis for treatment. Nasal washings and aspirates are unacceptable for Xpert Xpress SARS-CoV-2/FLU/RSV testing.  Fact Sheet for Patients: EntrepreneurPulse.com.au  Fact Sheet for Healthcare Providers: IncredibleEmployment.be  This test is not yet approved or cleared by the [place] FDA and has been authorized for detection and/or diagnosis of SARS-CoV-2 by FDA under an Emergency Use Authorization (EUA). This EUA will remain in effect (meaning this test can be used) for the duration of the COVID-19 declaration under Section 564(b)(1) of the Act, 21 U.S.C. section 360bbb-3(b)(1), unless the authorization is terminated or revoked.     Resp Syncytial Virus by PCR NEGATIVE NEGATIVE Final    Comment: (NOTE) Fact Sheet for Patients: EntrepreneurPulse.com.au  Fact Sheet for Healthcare Providers: IncredibleEmployment.be  This test is not yet approved or cleared by the [place] FDA and has been authorized for detection and/or diagnosis of SARS-CoV-2 by FDA under an Emergency Use  Authorization (EUA). This EUA will remain in effect (meaning this test can be used) for the duration of the COVID-19 declaration under Section 564(b)(1) of the Act, 21 U.S.C. section 360bbb-3(b)(1), unless the authorization is terminated or revoked.  Performed at [facility], [place] [address], [place], [place] [address]   Culture, blood (Routine X 2) w Reflex to ID Panel     Status: None   Collection Time: 10/24/22 11:50 PM   Specimen: BLOOD  Result Value Ref Range Status   Specimen Description BLOOD SITE NOT SPECIFIED  Final   Special Requests   Final    BOTTLES DRAWN AEROBIC AND ANAEROBIC Blood Culture results may not be optimal due to an excessive volume of blood received in culture bottles   Culture   Final    NO GROWTH 5 DAYS Performed at [facility], [place]  [address] [place], [place] [address]    Report Status 10/30/2022 FINAL  Final  Culture, blood (Routine X 2) w Reflex to ID Panel     Status: None   Collection Time: 10/24/22 11:55 PM   Specimen: BLOOD  Result Value Ref Range Status   Specimen Description BLOOD SITE NOT SPECIFIED  Final   Special Requests   Final    BOTTLES DRAWN AEROBIC AND ANAEROBIC Blood Culture results may not be optimal due to an excessive volume of blood received in culture bottles   Culture   Final    NO GROWTH 5 DAYS Performed at [facility], [address], [place], [place] [address]    Report Status 10/30/2022 FINAL  Final  SARS CORONAVIRUS 2 (TAT 6-24 HRS) Anterior Nasal Swab     Status: Abnormal   Collection Time: 10/29/22 12:00 PM   Specimen: Anterior Nasal Swab  Result Value Ref Range Status   SARS Coronavirus 2 POSITIVE (A) NEGATIVE Final    Comment: (NOTE) SARS-CoV-2 target nucleic acids are DETECTED.  The SARS-CoV-2 RNA is generally detectable in upper and lower respiratory specimens during the acute phase of infection. Positive results are indicative of the presence of SARS-CoV-2 RNA. Clinical correlation with  patient history and other diagnostic information is  necessary to determine patient infection status. Positive results do not rule out bacterial infection or co-infection with other viruses.  The expected result is Negative.  Fact Sheet for Patients: HairSlick.no  Fact Sheet for Healthcare Providers: quierodirigir.com  This test is not yet approved or cleared by the [place] FDA and  has been authorized for detection and/or diagnosis of SARS-CoV-2 by FDA under an Emergency Use Authorization (EUA). This EUA will remain  in effect (meaning this test can be used) for the duration of the COVID-19 declaration under Section 564(b)(1) of the Act, 21 U. S.C. section 360bbb-3(b)(1), unless the authorization is terminated or revoked sooner.   Performed at [facility], [address] [place], [place] [address]      Radiology Studies: No results found.   Pamella Pert, MD, PhD [facility]  Between 7 am - 7 pm I am available, please contact me via Amion (for emergencies) or Securechat (non urgent messages)  Between 7 pm - 7 am I am not available, please contact night coverage MD/APP via Amion

## 2022-11-02 NOTE — Plan of Care (Signed)

## 2022-11-03 DIAGNOSIS — G9340 Encephalopathy, unspecified: Secondary | ICD-10-CM | POA: Diagnosis not present

## 2022-11-03 MED ORDER — METOPROLOL SUCCINATE ER 25 MG PO TB24: 25.0000 mg | ORAL_TABLET | Freq: Every day | ORAL | Status: AC

## 2022-11-03 MED ORDER — OXYCONTIN 30 MG PO T12A: 30.0000 mg | EXTENDED_RELEASE_TABLET | Freq: Two times a day (BID) | ORAL | 0 refills | Status: AC

## 2022-11-03 MED ORDER — OXYCODONE HCL 5 MG PO TABS: 5.0000 mg | ORAL_TABLET | Freq: Four times a day (QID) | ORAL | 0 refills | Status: AC | PRN

## 2022-11-03 NOTE — TOC Transition Note (Signed)
Transition of Care [facility]) - CM/SW Discharge Note   Patient Details  Name: Kathleen [name] MRN: [id number] Date of Birth: [birth year]  Transition of [facility]) CM/SW Contact:  Bethann Berkshire, [place] Phone Number: 11/03/2022, 12:42 PM   Clinical Narrative:     Confirmed with [facility] pt can admit today. CSW spoke with [facility] social worker, Levada Dy, and confirmed they would transport pt by wheelchair transport. Pt's family  will bring pt's wheelchair prior to [facility] arriving. [facility] transport scheduled for 3pm. CSWconfirmed with pt's daughter that pts son will be bringing wheelchair prior to 3pm.   Patient will DC to: [facility] SNF Anticipated DC date: 11/03/22 Family notified: daughter Kathleen [name] Transport by: [facility] wheelchair transport   Per MD patient ready for DC to [facility] SNF. RN, patient, patient's family, and facility notified of DC. Discharge Summary and FL2 sent to facility. RN to call report prior to discharge ([phone] room 124). [facility] wheelchair transport scheduled for 3pm.   CSW will sign off for now as social work intervention is no longer needed. Please consult [place] again if new needs arise.    Final next level of care: Skilled Nursing Facility Barriers to Discharge: No Barriers Identified   Patient Goals and CMS Choice CMS Medicare.gov Compare Post Acute Care list provided to:: Patient Represenative (must comment) Choice offered to / list presented to : Adult Children  Discharge Placement                Patient [facility] bed at: [facility] Patient to be transferred to facility by: [facility] wheelchair transport Name of family member notified: Daughter Kathleen [name] Patient and family notified of of transfer: 11/03/22  Discharge Plan and Services Additional resources added to the After Visit Summary for   In-house Referral: Clinical Social Work   Post Acute Care Choice: [place]                               Social Determinants of Health  (SDOH) Interventions     Readmission Risk Interventions     No data to display

## 2022-11-03 NOTE — Progress Notes (Signed)
Attempted to call report to [facility]. Was transferred several times and secretary took this RN's name and number when the RN to receive report was unavailable.

## 2022-11-03 NOTE — Discharge Summary (Signed)
Physician Discharge Summary  Kathleen [name] [place] BWL:[id number] DOB: [birth year] DOA: 10/24/2022  PCP: Kathleen Northern, MD  Admit date: 10/24/2022 Discharge date: 11/03/2022  Admitted From: home Disposition:  SNF  Recommendations for Outpatient Follow-up:  Follow up with PCP in 1-2 weeks  Home Health: none Equipment/Devices: none  Discharge Condition: stable CODE STATUS: Full code Diet Orders (From admission, onward)     Start     Ordered   10/25/22 0119  Diet Heart Room service appropriate? Yes; Fluid consistency: Thin  Diet effective now       Question Answer Comment  Room service appropriate? Yes   Fluid consistency: Thin      10/25/22 0120            HPI: Per admitting MD, Kathleen [name] is a 82 y.o. 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 She states she fell on 1/25 due to weakness in her legs and just went down. Came to ED after this fall and was discharged. She went back home and they had some problems getting her in and out of car and trouble with her ADLs. On Friday she had a fever and was a little confused. Fever around 101. She was still weak and this prompted them to call 911. She has had fever,  she has some body ache and a dry throat, vision changes, +headaches, + chest pain (not now, but has had some intermittent chest pressure and palpitation),  shortness of breath or cough, abdominal pain, N/V/D, dysuria or leg swelling. She does not smoke or drink alcohol.   Hospital Course / Discharge diagnoses: Principal Problem:   Acute encephalopathy in setting of covid 19 Active Problems:   Weakness   Pancytopenia (HCC)   HTN (hypertension)   COVID-19 virus infection   Neuropathic pain   S/P lumbar fusion   Acute metabolic encephalopathy   Principal problem Acute Infective encephalopathy secondary to COVID-19  infection -improved with supportive treatment, mentation appears back to baseline.  Family declined Paxlovid, she had a mild cough but no shortness of breath or chest congestion.  She remains on room air.  Continue supportive care.  PT recommends SNF, completed 10 days of isolation.   Active problems Profound debility, chronic pain syndrome, spinal stenosis - Patient on chronic pain management.  Continue long-acting oxycodone and short acting oxycodone.   Right medial thigh cellulitis -There is no evidence of obvious abscess or collections.  Status post 5 days of Keflex Essential hypertension -patient's blood pressures have been relatively soft while here, did not need her enalapril, HCTZ, and her metoprolol dose had to be decreased.  Continue regimen as below, closely monitor blood pressure and if indicated resume prior home meds. Pancytopenia -in the setting of viral illness.  White count and platelets have now normalized  Sepsis ruled out   Discharge Instructions   Allergies as of 11/03/2022       Reactions   Aspirin Nausea And Vomiting        Medication List     STOP taking these medications    enalapril 20 MG tablet Commonly known as: VASOTEC   hydrochlorothiazide 50 MG tablet Commonly known as: HYDRODIURIL       TAKE these medications    acetaminophen 325 MG tablet Commonly known as: TYLENOL Take 650 mg by mouth every 6 (six) hours as needed for moderate pain.  ammonium lactate 12 % cream Commonly known as: Lac-Hydrin Apply 1 Application topically as needed for dry skin. What changed: when to take this   ascorbic acid 500 MG tablet Commonly known as: VITAMIN C Take 500 mg by mouth 2 (two) times daily.   atorvastatin 10 MG tablet Commonly known as: LIPITOR Take 10 mg by mouth daily.   Cholecalciferol 25 MCG (1000 UT) tablet Take 1,000 Units by mouth daily.   clopidogrel 75 MG tablet Commonly known as: PLAVIX Take 75 mg by mouth daily.   docusate  sodium 100 MG capsule Commonly known as: COLACE Take 100 mg by mouth at bedtime.   DULoxetine 60 MG capsule Commonly known as: CYMBALTA Take 60 mg by mouth daily.   gabapentin 400 MG capsule Commonly known as: NEURONTIN Take 400 mg by mouth 3 (three) times daily.   loratadine 10 MG tablet Commonly known as: CLARITIN Take 10 mg by mouth daily.   metoprolol succinate 25 MG 24 hr tablet Commonly known as: TOPROL-XL Take 1 tablet (25 mg total) by mouth daily. What changed:  medication strength how much to take when to take this additional instructions   multivitamin with minerals tablet Take 1 tablet by mouth daily.   OxyCONTIN 30 MG 12 hr tablet Generic drug: oxyCODONE Take 1 tablet (30 mg total) by mouth 2 (two) times daily.   oxyCODONE 5 MG immediate release tablet Commonly known as: Oxy IR/ROXICODONE Take 1 tablet (5 mg total) by mouth every 6 (six) hours as needed for breakthrough pain.        Contact information for after-discharge care     Destination     [facility] .   Service: Skilled Nursing Contact information: [address] [place] [phone]                     Consultations: None  Procedures/Studies:  DG CHEST PORT 1 VIEW  Result Date: 10/30/2022 CLINICAL DATA:  10031 Cough 10031 EXAM: PORTABLE CHEST 1 VIEW COMPARISON:  October 04, 2022 FINDINGS: The cardiomediastinal silhouette is unchanged in contour.There is enlargement of bilateral pulmonary arteries. No pleural effusion. No pneumothorax. Mild reticular nodularity at the bases. Visualized abdomen is unremarkable. Advanced degenerative changes of bilateral shoulders. Status post posterior fixation at the thoracolumbar junction. IMPRESSION: Mild reticular nodularity at the lung bases, nonspecific but could reflect early pulmonary edema, aspiration or infection. Enlargement of the pulmonary arteries could reflect a  degree of underlying pulmonary arterial hypertension. Electronically Signed   By: Valentino Saxon M.D.   On: 10/30/2022 09:59   DG CHEST PORT 1 VIEW  Result Date: 10/25/2022 CLINICAL DATA:  Increased weakness and COVID-19 positivity, initial encounter EXAM: PORTABLE CHEST 1 VIEW COMPARISON:  None Available. FINDINGS: Cardiac shadow is enlarged. Postsurgical changes are noted in the lower thoracic spine. Lungs are clear. No focal abnormality is noted. IMPRESSION: No acute abnormality noted. Electronically Signed   By: Inez Catalina M.D.   On: 10/25/2022 00:12   CT Head Wo Contrast  Result Date: 10/24/2022 CLINICAL DATA:  Mental status change, unknown cause EXAM: CT HEAD WITHOUT CONTRAST TECHNIQUE: Contiguous axial images were obtained from the base of the skull through the vertex without intravenous contrast. RADIATION DOSE REDUCTION: This exam was performed according to the departmental dose-optimization program which includes automated exposure control, adjustment of the mA and/or kV according to patient size and/or use of iterative reconstruction technique. COMPARISON:  None Available. FINDINGS: Brain: No  intracranial hemorrhage, mass effect, or midline shift. Normal for age atrophy with mild periventricular and deep white matter hypodensity typical of chronic small vessel ischemia. No hydrocephalus. The basilar cisterns are patent. No evidence of territorial infarct or acute ischemia. Enlarged partially empty sella. No extra-axial or intracranial fluid collection. Vascular: Atherosclerosis of skullbase vasculature without hyperdense vessel or abnormal calcification. Skull: No acute findings. Frontal hyperostosis. Sinuses/Orbits: No acute findings. Bilateral lens resection. Other: None. IMPRESSION: 1. No acute intracranial abnormality. 2. Mild chronic small vessel ischemia. 3. Enlarged partially empty sella, typically incidental but can be seen in the setting of intracranial hypertension. Electronically  Signed   By: Narda Rutherford M.D.   On: 10/24/2022 19:47   DG Knee Complete 4 Views Right  Result Date: 10/23/2022 CLINICAL DATA:  Fall, right knee pain EXAM: RIGHT KNEE - COMPLETE 4+ VIEW COMPARISON:  None Available. 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 No acute fracture or dislocation. No effusion. Mild diffuse subcutaneous edema. IMPRESSION: 1. Severe tricompartmental degenerative arthritis. Electronically Signed   By: Helyn Numbers M.D.   On: 10/23/2022 22:22   DG Ankle 2 Views Left  Result Date: 10/23/2022 CLINICAL DATA:  Fall, left ankle pain EXAM: LEFT ANKLE - 2 VIEW COMPARISON:  None FINDINGS: Normal alignment. No acute fracture or dislocation. Mild tibiotalar degenerative arthritis. No ankle effusion. Diffuse subcutaneous edema with bimalleolar soft tissue swelling IMPRESSION: 1. Soft tissue swelling. No acute fracture or dislocation. Electronically Signed   By: Helyn Numbers M.D.   On: 10/23/2022 22:22   DG Ankle 2 Views Right  Result Date: 10/23/2022 CLINICAL DATA:  Fall, right ankle pain EXAM: RIGHT ANKLE - 2 VIEW COMPARISON:  None Available. FINDINGS: Normal alignment. No acute fracture or dislocation. Moderate tibiotalar degenerative arthritis. No effusion. Diffuse subcutaneous edema with bimalleolar soft tissue swelling. IMPRESSION: 1. Soft tissue swelling. No acute fracture or dislocation. Electronically Signed   By: Helyn Numbers M.D.   On: 10/23/2022 22:21   DG Foot 2 Views Right  Result Date: 10/23/2022 CLINICAL DATA:  Trauma EXAM: RIGHT FOOT - 2 VIEW COMPARISON:  None Available. FINDINGS: The bones are osteopenic. There is no evidence of fracture or dislocation. There is no evidence of arthropathy or other focal bone abnormality. Soft tissues are unremarkable. IMPRESSION: 1. No acute fracture or dislocation. 2. Osteopenia. Electronically Signed   By: Darliss Cheney M.D.   On: 10/23/2022 22:20   DG Foot 2 Views Left  Result Date:  10/23/2022 CLINICAL DATA:  Fall, left foot pain EXAM: LEFT FOOT - 2 VIEW COMPARISON:  None Available. 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 Otherwise normal alignment. No acute fracture or dislocation. Joint spaces are preserved. Soft tissues are unremarkable. IMPRESSION: 1. 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 Electronically Signed   By: Helyn Numbers M.D.   On: 10/23/2022 22:20    Subjective: - no chest pain, shortness of breath, no abdominal pain, nausea or vomiting.   Discharge Exam: BP (!) 108/50 (BP Location: Right Arm)   Pulse 83   Temp 98.5 F (36.9 C) (Oral)   Resp 18   SpO2 93%   General: Pt is alert, awake, not in acute distress Cardiovascular: RRR, S1/S2 +, no rubs, no gallops Respiratory: CTA bilaterally, no wheezing, no rhonchi Abdominal: Soft, NT, ND, bowel sounds + Extremities: no edema, no cyanosis   The results of significant diagnostics from this hospitalization (including imaging, microbiology, ancillary and laboratory) are listed below for reference.  Microbiology: Recent Results (from the past 240 hour(s))  Resp panel by RT-PCR (RSV, Flu A&B, Covid) Anterior Nasal Swab     Status: Abnormal   Collection Time: 10/24/22  2:55 PM   Specimen: Anterior Nasal Swab  Result Value Ref Range Status   SARS Coronavirus 2 by RT PCR POSITIVE (A) NEGATIVE Final    Comment: (NOTE) SARS-CoV-2 target nucleic acids are DETECTED.  The SARS-CoV-2 RNA is generally detectable in upper respiratory specimens during the acute phase of infection. Positive results are indicative of the presence of the identified virus, but do not rule out bacterial infection or co-infection with other pathogens not detected by the test. Clinical correlation with patient history and other diagnostic information is necessary to determine patient infection status. The expected result is Negative.  Fact Sheet for  Patients: EntrepreneurPulse.com.au  Fact Sheet for Healthcare Providers: IncredibleEmployment.be  This test is not yet approved or cleared by the [place] FDA and  has been authorized for detection and/or diagnosis of SARS-CoV-2 by FDA under an Emergency Use Authorization (EUA).  This EUA will remain in effect (meaning this test can be used) for the duration of  the COVID-19 declaration under Section 564(b)(1) of the A ct, 21 U.S.C. section 360bbb-3(b)(1), unless the authorization is terminated or revoked sooner.     Influenza A by PCR NEGATIVE NEGATIVE Final   Influenza B by PCR NEGATIVE NEGATIVE Final    Comment: (NOTE) The Xpert Xpress SARS-CoV-2/FLU/RSV plus assay is intended as an aid in the diagnosis of influenza from Nasopharyngeal swab specimens and should not be used as a sole basis for treatment. Nasal washings and aspirates are unacceptable for Xpert Xpress SARS-CoV-2/FLU/RSV testing.  Fact Sheet for Patients: EntrepreneurPulse.com.au  Fact Sheet for Healthcare Providers: IncredibleEmployment.be  This test is not yet approved or cleared by the [place] FDA and has been authorized for detection and/or diagnosis of SARS-CoV-2 by FDA under an Emergency Use Authorization (EUA). This EUA will remain in effect (meaning this test can be used) for the duration of the COVID-19 declaration under Section 564(b)(1) of the Act, 21 U.S.C. section 360bbb-3(b)(1), unless the authorization is terminated or revoked.     Resp Syncytial Virus by PCR NEGATIVE NEGATIVE Final    Comment: (NOTE) Fact Sheet for Patients: EntrepreneurPulse.com.au  Fact Sheet for Healthcare Providers: IncredibleEmployment.be  This test is not yet approved or cleared by the [place] FDA and has been authorized for detection and/or diagnosis of SARS-CoV-2 by FDA under an Emergency Use  Authorization (EUA). This EUA will remain in effect (meaning this test can be used) for the duration of the COVID-19 declaration under Section 564(b)(1) of the Act, 21 U.S.C. section 360bbb-3(b)(1), unless the authorization is terminated or revoked.  Performed at [facility], [place] [address], [place], [place] [address]   Culture, blood (Routine X 2) w Reflex to ID Panel     Status: None   Collection Time: 10/24/22 11:50 PM   Specimen: BLOOD  Result Value Ref Range Status   Specimen Description BLOOD SITE NOT SPECIFIED  Final   Special Requests   Final    BOTTLES DRAWN AEROBIC AND ANAEROBIC Blood Culture results may not be optimal due to an excessive volume of blood received in culture bottles   Culture   Final    NO GROWTH 5 DAYS Performed at [facility], [place] [address] [place], [place] [address]    Report Status 10/30/2022 FINAL  Final  Culture, blood (Routine X  2) w Reflex to ID Panel     Status: None   Collection Time: 10/24/22 11:55 PM   Specimen: BLOOD  Result Value Ref Range Status   Specimen Description BLOOD SITE NOT SPECIFIED  Final   Special Requests   Final    BOTTLES DRAWN AEROBIC AND ANAEROBIC Blood Culture results may not be optimal due to an excessive volume of blood received in culture bottles   Culture   Final    NO GROWTH 5 DAYS Performed at [facility], [address], [place], [place] [address]    Report Status 10/30/2022 FINAL  Final  SARS CORONAVIRUS 2 (TAT 6-24 HRS) Anterior Nasal Swab     Status: Abnormal   Collection Time: 10/29/22 12:00 PM   Specimen: Anterior Nasal Swab  Result Value Ref Range Status   SARS Coronavirus 2 POSITIVE (A) NEGATIVE Final    Comment: (NOTE) SARS-CoV-2 target nucleic acids are DETECTED.  The SARS-CoV-2 RNA is generally detectable in upper and lower respiratory specimens during the acute phase of infection. Positive results are indicative of the presence of SARS-CoV-2 RNA. Clinical correlation with  patient history and other diagnostic information is  necessary to determine patient infection status. Positive results do not rule out bacterial infection or co-infection with other viruses.  The expected result is Negative.  Fact Sheet for Patients: HairSlick.no  Fact Sheet for Healthcare Providers: quierodirigir.com  This test is not yet approved or cleared by the [place] FDA and  has been authorized for detection and/or diagnosis of SARS-CoV-2 by FDA under an Emergency Use Authorization (EUA). This EUA will remain  in effect (meaning this test can be used) for the duration of the COVID-19 declaration under Section 564(b)(1) of the Act, 21 U. S.C. section 360bbb-3(b)(1), unless the authorization is terminated or revoked sooner.   Performed at [facility], [address] [place], [place] [address]      Labs: Basic Metabolic Panel: Recent Labs  Lab 10/28/22 0507 10/29/22 0112 11/01/22 0251  NA 136 135 137  K 3.8 4.0 3.8  CL 97* 94* 99  CO2 32 33* 30  GLUCOSE 100* 101* 108*  BUN 14 19 21   CREATININE 0.75 1.00 0.78  CALCIUM 8.6* 8.8* 8.6*  MG  --   --  2.2   Liver Function Tests: Recent Labs  Lab 10/28/22 0507 10/29/22 0112 11/01/22 0251  AST 33 40 33  ALT 19 22 25   ALKPHOS 61 67 65  BILITOT 0.5 0.5 0.5  PROT 6.8 7.3 6.9  ALBUMIN 2.5* 2.8* 2.7*   CBC: Recent Labs  Lab 10/28/22 0507 10/29/22 0112 11/01/22 0251  WBC 3.8* 4.1 5.7  NEUTROABS 1.4* 1.7  --   HGB 11.6* 12.6 11.4*  HCT 35.2* 38.3 36.4  MCV 90.5 89.5 92.4  PLT 136* 159 188   CBG: No results for input(s): "GLUCAP" in the last 168 hours. Hgb A1c No results for input(s): "HGBA1C" in the last 72 hours. Lipid Profile No results for input(s): "CHOL", "HDL", "LDLCALC", "TRIG", "CHOLHDL", "LDLDIRECT" in the last 72 hours. Thyroid function studies No results for input(s): "TSH", "T4TOTAL", "T3FREE", "THYROIDAB" in the last 72  hours.  Invalid input(s): "FREET3" Urinalysis    Component Value Date/Time   COLORURINE YELLOW 10/24/2022 1416   APPEARANCEUR CLEAR 10/24/2022 1416   LABSPEC 1.010 10/24/2022 1416   PHURINE 6.0 10/24/2022 1416   GLUCOSEU NEGATIVE 10/24/2022 1416   HGBUR NEGATIVE 10/24/2022 1416   BILIRUBINUR NEGATIVE 10/24/2022 1416  KETONESUR NEGATIVE 10/24/2022 1416   PROTEINUR NEGATIVE 10/24/2022 1416   NITRITE NEGATIVE 10/24/2022 [place] 10/24/2022 1416    FURTHER DISCHARGE INSTRUCTIONS:   Get Medicines reviewed and adjusted: Please take all your medications with you for your next visit with your Primary MD   Laboratory/radiological data: Please request your Primary MD to go over all hospital tests and procedure/radiological results at the follow up, please ask your Primary MD to get all Hospital records sent to his/her office.   In some cases, they will be blood work, cultures and biopsy results pending at the time of your discharge. Please request that your primary care M.D. goes through all the records of your hospital data and follows up on these results.   Also Note the following: If you experience worsening of your admission symptoms, develop shortness of breath, life threatening emergency, suicidal or homicidal thoughts you must seek medical attention immediately by calling 911 or calling your MD immediately  if symptoms less severe.   You must read complete instructions/literature along with all the possible adverse reactions/side effects for all the Medicines you take and that have been prescribed to you. Take any new Medicines after you have completely understood and accpet all the possible adverse reactions/side effects.    Do not drive when taking Pain medications or sleeping medications (Benzodaizepines)   Do not take more than prescribed Pain, Sleep and Anxiety Medications. It is not advisable to combine anxiety,sleep and pain medications without talking  with your primary care practitioner   Special Instructions: If you have smoked or chewed Tobacco  in the last 2 yrs please stop smoking, stop any regular Alcohol  and or any Recreational drug use.   Wear Seat belts while driving.   Please note: You were cared for by a hospitalist during your hospital stay. Once you are discharged, your primary care physician will handle any further medical issues. Please note that NO REFILLS for any discharge medications will be authorized once you are discharged, as it is imperative that you return to your primary care physician (or establish a relationship with a primary care physician if you do not have one) for your post hospital discharge needs so that they can reassess your need for medications and monitor your lab values.  Time coordinating discharge: 35 minutes  SIGNED:  Marzetta Board, MD, PhD 11/03/2022, 9:02 AM

## 2022-11-03 NOTE — Progress Notes (Signed)
Physical Therapy Treatment Patient Details Name: Kathleen [name] MRN: [id number] DOB: [birth year] Today's Date: 11/03/2022   History of Present Illness Pt is a 82 y.o. F who presents 10/24/2022 with complaints of weakness and confusion. Pt with recent fall 1/25 in which she was in ED. Found to have Covid-19. Significant PMH: HTN, OA, spinal stenosis, s/p thoracic laminectomy and fusion.    PT Comments    Pt received in supine, agreeable to therapy session with emphasis on supine LE exercises for strengthening and transfer training. Pt limited due to RLE pain (and LLE but RLE most severe) with weight bearing, awaiting R knee hyperextension brace so PTA assisted with blocking posterior RLE while pt pivoting to [facility] and pt needing +2 modA for safety with transfers/pivoting. Pt up in recliner with lunch tray set up at end of session, RN notified. Ortho tech called and will be sending rep over from [facility] to assist pt with R knee hyperextension brace. Pt continues to benefit from PT services to progress toward functional mobility goals.    Recommendations for follow up therapy are one component of a multi-disciplinary discharge planning process, led by the attending physician.  Recommendations may be updated based on patient status, additional functional criteria and insurance authorization.  Follow Up Recommendations  Skilled nursing-short term rehab (<3 hours/day) Can patient physically be transported by private vehicle: No   Assistance Recommended at Discharge Frequent or constant Supervision/Assistance  Patient can return home with the following Two people to help with walking and/or transfers;Help with stairs or ramp for entrance;Assist for transportation;Assistance with cooking/housework   Equipment Recommendations  Other (comment) (TBD post-acute, she needs R knee hyperextension brace, order in on Saturday, ortho tech called to see if it can be delivered prior to her DC to SNF)     Recommendations for Other Services       Precautions / Restrictions Precautions Precautions: Fall Precaution Comments: R knee hyperextension needing brace for safety/comfort Required Braces or Orthoses:  (see above) Restrictions Weight Bearing Restrictions: No     Mobility  Bed Mobility Overal bed mobility: Needs Assistance Bed Mobility: Supine to Sit     Supine to sit: Min guard, HOB elevated     General bed mobility comments: increased time/effort to perform, min guard due to intermittent posterior lean, pt able to use bed rail to prevent posterior LOB    Transfers Overall transfer level: Needs assistance Equipment used: Rolling walker (2 wheels) (bariatric) Transfers: Sit to/from Stand Sit to Stand: From elevated surface, +2 physical assistance, Mod assist   Step pivot transfers: Mod assist, +2 safety/equipment       General transfer comment: assist to rise and steady, pt using bari RW to pivot toward [facility] on her L side, then after toileting needing BSC pulled away from behind her and recliner chair pushed behind her due to RLE pain from hyperextension (brace pending, arriving via [facility] had not arrived at time of session).    Ambulation/Gait Ambulation/Gait assistance: Mod assist, +2 physical assistance, +2 safety/equipment   Assistive device: Rolling walker (2 wheels) (bariatric) Gait Pattern/deviations: Step-to pattern, Shuffle, Knee hyperextension - right, Trunk flexed, Wide base of support, Decreased step length - right, Decreased step length - left, Decreased dorsiflexion - right, Decreased dorsiflexion - left     Pre-gait activities: sidesteps and pivotal steps ~52ft toward her L side with PTA providing manual assist behind her R knee and also assist with RW management, +2 physical assist needed intermittently due to discomfort and RLE  pain; tachy and RLE pain so defer functional gait in room after pivot to [facility]     Balance Overall balance assessment: Needs  assistance Sitting-balance support: Feet supported Sitting balance-Leahy Scale: Good     Standing balance support: Bilateral upper extremity supported, Reliant on assistive device for balance Standing balance-Leahy Scale: Poor Standing balance comment: reliant on RW and +1-2 external assist for safety with dynamic standing tasks                            Cognition Arousal/Alertness: Awake/alert Behavior During Therapy: WFL for tasks assessed/performed, Anxious Overall Cognitive Status: Impaired/Different from baseline Area of Impairment: Memory                               General Comments: may be baseline; multimodal cues at times helpful; increased time to initiate all tasks        Exercises Other Exercises Other Exercises: supine BLE AROM: ankle pumps, heel slides, hip abduction, SAQ, glute sets and quad sets x10 reps ea    General Comments General comments (skin integrity, edema, etc.): HR to 152 bpm with exertional tasks (pivot transfer), HR decrease to ~107-115 bpm resting; SpO2 92% and greater when good waveform achieved (pt hands cold and better signal after PTA placed warm blanket over hand with sensor attached).      Pertinent Vitals/Pain Pain Assessment Pain Assessment: Faces Faces Pain Scale: Hurts even more Pain Location: RLE (posterior knees R>L with standing/pivotal transfer) Pain Descriptors / Indicators: Discomfort, Grimacing, Guarding, Sharp Pain Intervention(s): Monitored during session, Limited activity within patient's tolerance, Repositioned     PT Goals (current goals can now be found in the care plan section) Acute Rehab PT Goals Patient Stated Goal: pt daughter agreeable to rehab PT Goal Formulation: With patient/family Time For Goal Achievement: 11/08/22 Progress towards PT goals: Progressing toward goals    Frequency    Min 2X/week      PT Plan Current plan remains appropriate       AM-PAC PT "6 Clicks"  Mobility   Outcome Measure  Help needed turning from your back to your side while in a flat bed without using bedrails?: A Little Help needed moving from lying on your back to sitting on the side of a flat bed without using bedrails?: A Little (pt has hospital bed at home per her report so will be able to use rails at home) Help needed moving to and from a bed to a chair (including a wheelchair)?: A Lot Help needed standing up from a chair using your arms (e.g., wheelchair or bedside chair)?: A Lot Help needed to walk in hospital room?: Total (<42ft) Help needed climbing 3-5 steps with a railing? : Total 6 Click Score: 12    End of Session Equipment Utilized During Treatment: Gait belt Activity Tolerance: Patient tolerated treatment well Patient left: in chair;with call bell/phone within reach;with chair alarm set;Other (comment) (heels floated, lunch tray set up in front of her) Nurse Communication: Mobility status;Need for lift equipment;Precautions;Other (comment) (pt awaiting R knee hyperextension brace (ortho tech called by this PTA), lift pad under her in chair for safety, she needs purewick placed as she has baseline urinary incontinence) PT Visit Diagnosis: Other abnormalities of gait and mobility (R26.89);Muscle weakness (generalized) (M62.81);History of falling (Z91.81)     Time: 4098-1191 PT Time Calculation (min) (ACUTE ONLY): 54 min  Charges:  $  Gait Training: 8-22 mins $Therapeutic Exercise: 8-22 mins $Therapeutic Activity: 23-37 mins                     Maisie Hauser P., PTA Acute Rehabilitation Services Secure Chat Preferred 9a-5:30pm Office: [place] 11/03/2022, 4:47 PM

## 2022-11-03 NOTE — Progress Notes (Signed)
Orthopedic Tech Progress Note Patient Details:  Kathleen [name] 1941/05/25 [id number]  PT called requesting a [place], and the order was in NURSING orders so we was not able to see that until today. Will call in order to [facility]    Patient ID: Kathleen [name], female   DOB: Feb 24, 1941, 82 y.o.   MRN: [id number]  Kathleen [name] 11/03/2022, 12:51 PM

## 2022-11-03 NOTE — Progress Notes (Signed)
Attempted to call report to [facility]. Was transferred several times and placed on hold for more than 5 minutes. Will attempt report again at a later time.

## 2022-11-05 ENCOUNTER — Ambulatory Visit: Payer: Medicare (Managed Care) | Admitting: Podiatry

## 2022-11-21 ENCOUNTER — Ambulatory Visit: Payer: Medicare (Managed Care)

## 2022-12-17 ENCOUNTER — Ambulatory Visit: Payer: Medicare (Managed Care) | Admitting: Podiatry

## 2023-01-20 ENCOUNTER — Ambulatory Visit
Admission: RE | Admit: 2023-01-20 | Discharge: 2023-01-20 | Disposition: A | Discharge status: Home / Self Care | Payer: Medicare (Managed Care) | Source: Ambulatory Visit | Attending: Internal Medicine | Admitting: Internal Medicine

## 2023-01-20 ENCOUNTER — Ambulatory Visit: Payer: Medicare (Managed Care)

## 2023-01-20 DIAGNOSIS — Z1231 Encounter for screening mammogram for malignant neoplasm of breast: Secondary | ICD-10-CM

## 2023-02-28 ENCOUNTER — Other Ambulatory Visit: Payer: Self-pay

## 2023-02-28 ENCOUNTER — Emergency Department (HOSPITAL_COMMUNITY): Payer: Medicare (Managed Care)

## 2023-02-28 ENCOUNTER — Emergency Department (HOSPITAL_COMMUNITY)
Admission: EM | Admit: 2023-02-28 | Discharge: 2023-03-02 | Disposition: A | Discharge status: Home / Self Care | Payer: Medicare (Managed Care) | Attending: Emergency Medicine | Admitting: Emergency Medicine

## 2023-02-28 DIAGNOSIS — Z7982 Long term (current) use of aspirin: Secondary | ICD-10-CM | POA: Insufficient documentation

## 2023-02-28 DIAGNOSIS — Z9181 History of falling: Secondary | ICD-10-CM | POA: Insufficient documentation

## 2023-02-28 DIAGNOSIS — Z79899 Other long term (current) drug therapy: Secondary | ICD-10-CM | POA: Diagnosis not present

## 2023-02-28 DIAGNOSIS — Z1152 Encounter for screening for COVID-19: Secondary | ICD-10-CM | POA: Insufficient documentation

## 2023-02-28 DIAGNOSIS — R410 Disorientation, unspecified: Secondary | ICD-10-CM | POA: Diagnosis not present

## 2023-02-28 DIAGNOSIS — R531 Weakness: Secondary | ICD-10-CM | POA: Diagnosis present

## 2023-02-28 DIAGNOSIS — R2681 Unsteadiness on feet: Secondary | ICD-10-CM | POA: Insufficient documentation

## 2023-02-28 DIAGNOSIS — Z7902 Long term (current) use of antithrombotics/antiplatelets: Secondary | ICD-10-CM | POA: Insufficient documentation

## 2023-02-28 DIAGNOSIS — Z8616 Personal history of COVID-19: Secondary | ICD-10-CM | POA: Insufficient documentation

## 2023-02-28 DIAGNOSIS — R509 Fever, unspecified: Secondary | ICD-10-CM | POA: Insufficient documentation

## 2023-02-28 LAB — URINALYSIS, ROUTINE W REFLEX MICROSCOPIC
Bilirubin Urine: NEGATIVE
Glucose, UA: NEGATIVE mg/dL
Hgb urine dipstick: NEGATIVE
Ketones, ur: NEGATIVE mg/dL
Leukocytes,Ua: NEGATIVE
Nitrite: NEGATIVE
Protein, ur: NEGATIVE mg/dL
Specific Gravity, Urine: 1.011 (ref 1.005–1.030)
pH: 7 (ref 5.0–8.0)

## 2023-02-28 LAB — RESP PANEL BY RT-PCR (RSV, FLU A&B, COVID)  RVPGX2
Influenza A by PCR: NEGATIVE
Influenza B by PCR: NEGATIVE
Resp Syncytial Virus by PCR: NEGATIVE
SARS Coronavirus 2 by RT PCR: NEGATIVE

## 2023-02-28 LAB — COMPREHENSIVE METABOLIC PANEL
ALT: 32 U/L (ref 0–44)
AST: 22 U/L (ref 15–41)
Albumin: 2.9 g/dL — ABNORMAL LOW (ref 3.5–5.0)
Alkaline Phosphatase: 68 U/L (ref 38–126)
Anion gap: 8 (ref 5–15)
BUN: 12 mg/dL (ref 8–23)
CO2: 29 mmol/L (ref 22–32)
Calcium: 8.6 mg/dL — ABNORMAL LOW (ref 8.9–10.3)
Chloride: 100 mmol/L (ref 98–111)
Creatinine, Ser: 0.76 mg/dL (ref 0.44–1.00)
GFR, Estimated: >60 mL/min (ref >60)
Glucose, Bld: 103 mg/dL — ABNORMAL HIGH (ref 70–99)
Potassium: 4 mmol/L (ref 3.5–5.1)
Sodium: 137 mmol/L (ref 135–145)
Total Bilirubin: 0.7 mg/dL (ref 0.3–1.2)
Total Protein: 7.5 g/dL (ref 6.5–8.1)

## 2023-02-28 LAB — CBC WITH DIFFERENTIAL/PLATELET
Abs Immature Granulocytes: 0.01 10*3/uL (ref 0.00–0.07)
Basophils Absolute: 0 10*3/uL (ref 0.0–0.1)
Basophils Relative: 1 %
Eosinophils Absolute: 0.1 10*3/uL (ref 0.0–0.5)
Eosinophils Relative: 2 %
HCT: 35.1 % — ABNORMAL LOW (ref 36.0–46.0)
Hemoglobin: 11 g/dL — ABNORMAL LOW (ref 12.0–15.0)
Immature Granulocytes: 0 %
Lymphocytes Relative: 28 %
Lymphs Abs: 1.2 10*3/uL (ref 0.7–4.0)
MCH: 29.3 pg (ref 26.0–34.0)
MCHC: 31.3 g/dL (ref 30.0–36.0)
MCV: 93.6 fL (ref 80.0–100.0)
Monocytes Absolute: 0.5 10*3/uL (ref 0.1–1.0)
Monocytes Relative: 11 %
Neutro Abs: 2.4 10*3/uL (ref 1.7–7.7)
Neutrophils Relative %: 58 %
Platelets: 173 10*3/uL (ref 150–400)
RBC: 3.75 MIL/uL — ABNORMAL LOW (ref 3.87–5.11)
RDW: 15.2 % (ref 11.5–15.5)
WBC: 4.2 10*3/uL (ref 4.0–10.5)
nRBC: 0 % (ref 0.0–0.2)

## 2023-02-28 LAB — MAGNESIUM: Magnesium: 1.9 mg/dL (ref 1.7–2.4)

## 2023-02-28 MED ORDER — OXYCODONE HCL ER 10 MG PO T12A
30.0000 mg | EXTENDED_RELEASE_TABLET | Freq: Two times a day (BID) | ORAL | Status: DC
  Administered 2023-03-01 – 2023-03-02 (×3): 30 mg via ORAL
  Filled 2023-02-28 (×2): qty 2
  Filled 2023-02-28: qty 3
  Filled 2023-02-28: qty 2

## 2023-02-28 MED ORDER — ATORVASTATIN CALCIUM 10 MG PO TABS
10.0000 mg | ORAL_TABLET | Freq: Every day | ORAL | Status: DC
  Administered 2023-03-01 – 2023-03-02 (×2): 10 mg via ORAL
  Filled 2023-02-28 (×2): qty 1

## 2023-02-28 MED ORDER — CLOPIDOGREL BISULFATE 75 MG PO TABS
75.0000 mg | ORAL_TABLET | Freq: Every day | ORAL | Status: DC
  Administered 2023-03-01 – 2023-03-02 (×2): 75 mg via ORAL
  Filled 2023-02-28 (×2): qty 1

## 2023-02-28 MED ORDER — DULOXETINE HCL 60 MG PO CPEP
60.0000 mg | ORAL_CAPSULE | Freq: Every day | ORAL | Status: DC
  Administered 2023-03-01 – 2023-03-02 (×2): 60 mg via ORAL
  Filled 2023-02-28: qty 2
  Filled 2023-02-28: qty 1

## 2023-02-28 MED ORDER — ENALAPRIL MALEATE 10 MG PO TABS
20.0000 mg | ORAL_TABLET | Freq: Two times a day (BID) | ORAL | Status: DC
  Administered 2023-03-01 – 2023-03-02 (×3): 20 mg via ORAL
  Filled 2023-02-28 (×3): qty 2

## 2023-02-28 MED ORDER — OXYCODONE HCL 5 MG PO TABS: 10.0000 mg | ORAL_TABLET | Freq: Four times a day (QID) | ORAL | Status: DC | PRN

## 2023-02-28 MED ORDER — GABAPENTIN 400 MG PO CAPS
400.0000 mg | ORAL_CAPSULE | Freq: Three times a day (TID) | ORAL | Status: DC
  Administered 2023-03-01 – 2023-03-02 (×4): 400 mg via ORAL
  Filled 2023-02-28 (×4): qty 1

## 2023-02-28 MED ORDER — POLYETHYLENE GLYCOL 3350 17 G PO PACK
17.0000 g | PACK | Freq: Every day | ORAL | Status: DC | PRN
  Administered 2023-03-02: 17 g via ORAL
  Filled 2023-02-28: qty 1

## 2023-02-28 NOTE — ED Notes (Signed)
Pt is a&ox4, warm and dry to touch. Pt complains of decreased mobility. Recent falls and confusion per family members. Pt denies any head hits or any other pain or discomfort. Pt changed into a gown, attached to monitor/vitals. Side rails up x 2, call light within reach.

## 2023-02-28 NOTE — ED Notes (Signed)
Pt attempted to ambulate with walker. Pt was able to stand and ambulate with walker only in the room to foot of bed before she tried to sit in a wheel chair.

## 2023-02-28 NOTE — ED Notes (Signed)
Pt took her Oxycontin ER from home at 22:00

## 2023-02-28 NOTE — ED Provider Notes (Signed)
[place] EMERGENCY DEPARTMENT AT [facility] Provider Note   CSN: [id number] Arrival date & time: 02/28/23  1531     History  Chief Complaint  Patient presents with   Weakness    Pt coming from home with decreased mobility, confusion per family. Recent falls with no head hits.     DAJANAI BREER is a 82 y.o. female.  82 year old female with past medical history of lumbar stenosis, OSA presents today for concern of confusion and fever.  Symptoms started today.  Patient primarily states that her daughter conveyed the symptoms to her as opposed to her presenting for these complaints.  She is unsure of how high the temperature was.  She is currently alert and oriented x 4.  Denies any URI symptoms, abdominal pain, nausea and vomiting, dysuria, rash, or other complaints.   Weakness Associated symptoms: fever   Associated symptoms: no abdominal pain, no chest pain, no dysuria, no headaches, no nausea, no shortness of breath and no vomiting        Home Medications Prior to Admission medications   Medication Sig Start Date End Date Taking? Authorizing Provider  acetaminophen (TYLENOL) 325 MG tablet Take 650 mg by mouth every 6 (six) hours as needed for moderate pain.    [provider]  ammonium lactate (LAC-HYDRIN) 12 % cream Apply 1 Application topically as needed for dry skin. Patient taking differently: Apply 1 Application topically daily as needed for dry skin. 09/11/22   Helane Gunther, DPM  atorvastatin (LIPITOR) 10 MG tablet Take 10 mg by mouth daily.    [provider]  Cholecalciferol 1000 units tablet Take 1,000 Units by mouth daily.    [provider]  clopidogrel (PLAVIX) 75 MG tablet Take 75 mg by mouth daily.    [provider]  docusate sodium (COLACE) 100 MG capsule Take 100 mg by mouth at bedtime.    [provider]  DULoxetine (CYMBALTA) 60 MG capsule Take 60 mg by mouth daily.    [provider]   gabapentin (NEURONTIN) 400 MG capsule Take 400 mg by mouth 3 (three) times daily.    [provider]  loratadine (CLARITIN) 10 MG tablet Take 10 mg by mouth daily.    [provider]  metoprolol succinate (TOPROL-XL) 25 MG 24 hr tablet Take 1 tablet (25 mg total) by mouth daily. 11/03/22   Leatha Gilding, MD  Multiple Vitamins-Minerals (MULTIVITAMIN WITH MINERALS) tablet Take 1 tablet by mouth daily.    [provider]  oxyCODONE (OXY IR/ROXICODONE) 5 MG immediate release tablet Take 1 tablet (5 mg total) by mouth every 6 (six) hours as needed for breakthrough pain. 11/03/22   Leatha Gilding, MD  oxyCODONE (OXYCONTIN) 30 MG 12 hr tablet Take 1 tablet (30 mg total) by mouth 2 (two) times daily. 11/03/22   Leatha Gilding, MD  vitamin C (ASCORBIC ACID) 500 MG tablet Take 500 mg by mouth 2 (two) times daily.    [provider]      Allergies    Aspirin    Review of Systems   Review of Systems  Constitutional:  Positive for fever.  Respiratory:  Negative for shortness of breath.   Cardiovascular:  Negative for chest pain.  Gastrointestinal:  Negative for abdominal pain, nausea and vomiting.  Genitourinary:  Negative for dysuria.  Neurological:  Positive for weakness. Negative for light-headedness and headaches.  All other systems reviewed and are negative.   Physical Exam Updated Vital  Signs BP (!) 141/107 (BP Location: Right Arm)   Temp 98.8 F (37.1 C) (Oral)   Resp 13   Ht 5\' 3"  (1.6 m)   Wt 131.5 kg   SpO2 100%   BMI 51.37 kg/m  Physical Exam Vitals and nursing note reviewed.  Constitutional:      General: She is not in acute distress.    Appearance: Normal appearance. She is not ill-appearing.  HENT:     Head: Normocephalic and atraumatic.     Nose: Nose normal.  Eyes:     General: No scleral icterus.    Extraocular Movements: Extraocular movements intact.     Conjunctiva/sclera: Conjunctivae normal.  Cardiovascular:     Rate and  Rhythm: Normal rate and regular rhythm.     Pulses: Normal pulses.     Heart sounds: Normal heart sounds.  Pulmonary:     Effort: Pulmonary effort is normal. No respiratory distress.     Breath sounds: Normal breath sounds. No wheezing or rales.  Abdominal:     General: There is no distension.     Palpations: Abdomen is soft.     Tenderness: There is no abdominal tenderness. There is no guarding.  Musculoskeletal:        General: Normal range of motion.     Cervical back: Normal range of motion.     Right lower leg: No edema.     Left lower leg: No edema.  Skin:    General: Skin is warm and dry.  Neurological:     General: No focal deficit present.     Mental Status: She is alert. Mental status is at baseline.     ED Results / Procedures / Treatments   Labs (all labs ordered are listed, but only abnormal results are displayed) Labs Reviewed  RESP PANEL BY RT-PCR (RSV, FLU A&B, COVID)  RVPGX2  CBC WITH DIFFERENTIAL/PLATELET  COMPREHENSIVE METABOLIC PANEL  MAGNESIUM    EKG None  Radiology DG Chest Portable 1 View  Result Date: 02/28/2023 CLINICAL DATA:  Weakness EXAM: PORTABLE CHEST 1 VIEW COMPARISON:  Chest x-ray dated October 30, 2022 FINDINGS: Cardiac and mediastinal contours are within normal limits. Prior posterior fusion of the lower thoracic spine. Lungs are clear. No evidence of pleural effusion or pneumothorax. Advanced degenerative changes of the right-greater-than-left shoulders. IMPRESSION: No active disease. Electronically Signed   By: Allegra Lai M.D.   On: 02/28/2023 16:29    Procedures Procedures    Medications Ordered in ED Medications - No data to display  ED Course/ Medical Decision Making/ A&P Clinical Course as of 02/28/23 2211  Sat Feb 28, 2023  1738 This is a very pleasant 82 year old female presented to ED with concern for confusion at home.  Her daughter and her son, whom she lives with, reports that the patient has had waxing waning  confusion for several days, confusing events in the house, which is unusual for her.  They have also noted an on and off tremor in her hands for the past month.  The patient was hospitalized for encephalopathy and COVID-19 in February, but the family said typically her her mental status is sharp, and she is not confused or demented.  On my exam the patient is comfortable, has no complaints.  She did have a reported fall at home and is pending CT imaging of her head and cervical spine for trauma eval.  Her chest x-ray does not show any focal infiltrate.  Her blood work is unremarkable, no  acute anemia, white blood cell count within normal limits, no evidence of acute dehydration, AKI, electrolyte derangement.  She is pending a UA.  Low suspicion for PE, meningitis, sepsis. [MT]  1919 Workup is unremarkable.  I would anticipate discharge home with family, advising close PCP follow up for reassessment of mental status, and return precautions to ED [MT]    Clinical Course User Index [MT] Trifan, Kermit Balo, MD                             Medical Decision Making Amount and/or Complexity of Data Reviewed Labs: ordered. Radiology: ordered.   Medical Decision Making / ED Course   This patient presents to the ED for concern of fever, confusion, this involves an extensive number of treatment options, and is a complaint that carries with it a high risk of complications and morbidity.  The differential diagnosis includes metabolic encephalopathy, pneumonia, UTI, viral URI, other infectious pathology  MDM: 82 year old female presents with above-mentioned complaints.  She is alert and oriented on exam.  Well-appearing.  Without acute distress.  She does report a fall yesterday that occurred as she was transitioning into the wheelchair.  She states she ambulated down a ramp and was turning and that she went to sit on a chair she somehow missed the chair.  She states she fell directly on her buttocks.  Denies  head trauma.  She is adamant about no head trauma or loss of consciousness.  Will obtain labs, chest x-ray, UA, and respiratory panel.  Children not at bedside yet.  According the patient they are on their way.  Will obtain additional history from them when they arrive.  CBC without leukocytosis.  Mild anemia around patient's baseline.  CMP without renal insufficiency.  Normal electrolytes.  Magnesium 1.9.  UA without evidence of UTI.  COVID, flu, and RSV negative.  CT head, cervical spine without acute intracranial or cervical spine finding.  Chest x-ray without acute cardiopulmonary process.  Findings discussed with patient's daughter and patient.  Patient appears to be alert and oriented x 4.  Attempted to discharge patient.  However prior to discharge we did ambulate patient and patient did not do well.  Family is concerned that patient is not able to ambulate.  They states 2 days ago patient was able to ambulate.  They are concerned and feel that it is unsafe for patient to return home and that she needs to be placed.  No indication for admission at this time.  Discussed with attending.  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  Lab Tests: -I ordered, reviewed, and interpreted labs.   The pertinent results include:   Labs Reviewed  CBC WITH DIFFERENTIAL/PLATELET - Abnormal; Notable for the following components:      Result Value   RBC 3.75 (*)    Hemoglobin 11.0 (*)    HCT 35.1 (*)    All other components within normal limits  RESP PANEL BY RT-PCR (RSV, FLU A&B, COVID)  RVPGX2  COMPREHENSIVE METABOLIC PANEL  MAGNESIUM      EKG  EKG Interpretation  Date/Time:    Ventricular Rate:    PR Interval:    QRS Duration:   QT Interval:    QTC Calculation:   R Axis:     Text Interpretation:           Imaging Studies ordered: I ordered imaging studies including CT head,  CT cervical spine, chest x-ray I independently visualized and interpreted  imaging. I agree with the radiologist interpretation   Medicines ordered and prescription drug management: No orders of the defined types were placed in this encounter.   -I have reviewed the patients home medicines and have made adjustments as needed   Reevaluation: After the interventions noted above, I reevaluated the patient and found that they have :improved  Co morbidities that complicate the patient evaluation  Past Medical History:  Diagnosis Date   Ambulatory dysfunction    Arthritis    Hypertension    Spinal stenosis       Dispostion: Patient will board in the emergency department for [facility] consult and potential placement.  Final Clinical Impression(s) / ED Diagnoses Final diagnoses:  Weakness  Confusion    Rx / DC Orders ED Discharge Orders     None         Marita Kansas, PA-C 02/28/23 2213    Terald Sleeper, MD 02/28/23 2312

## 2023-03-01 ENCOUNTER — Encounter (HOSPITAL_COMMUNITY): Payer: Self-pay | Admitting: *Deleted

## 2023-03-01 NOTE — ED Notes (Signed)
Breakfast tray given, patient eating breakfast watching TV

## 2023-03-01 NOTE — Progress Notes (Signed)
TOC CSW spoke with Olegario Messier at [facility].  [facility] is not contracted with [facility].  Elieser Tetrick Tarpley-Carter, MSW, LCSW-A Pronouns:  She/Her/Hers [facility] HealthTransitions of Care Clinical Social Worker Direct Number:  [phone] [email or web address]

## 2023-03-01 NOTE — Evaluation (Signed)
Physical Therapy Evaluation Patient Details Name: Kathleen [name] MRN: [id number] DOB: Jun 12, 1941 Today's Date: 03/01/2023  History of Present Illness  Pt is a 82 yo female who came to ED due to several days of confusion and recent fall. Pt typically able to amb with RW however now unable. PMH: lumbar stenosis, OSA   Clinical Impression  Pt brought in from home due to inability to ambulate. Pt was ambulating and take herself to the bathroom with use of RW up until fall 2 days ago. Pt now takes 2 people to stand her safely and can only complete a few side steps prior to R LE giving out. Pt to benefit from rehab program < 3 hours a day to achieve safe level of mobility to return home with family. Acute PT to cont to follow.       Recommendations for follow up therapy are one component of a multi-disciplinary discharge planning process, led by the attending physician.  Recommendations may be updated based on patient status, additional functional criteria and insurance authorization.  Follow Up Recommendations Can patient physically be transported by private vehicle: No     Assistance Recommended at Discharge Frequent or constant Supervision/Assistance  Patient can return home with the following  Two people to help with walking and/or transfers;Two people to help with bathing/dressing/bathroom;Assist for transportation;Help with stairs or ramp for entrance    Equipment Recommendations None recommended by PT  Recommendations for Other Services       Functional Status Assessment Patient has had a recent decline in their functional status and demonstrates the ability to make significant improvements in function in a reasonable and predictable amount of time.     Precautions / Restrictions Precautions Precautions: Fall Precaution Comments: morbidly obese Restrictions Weight Bearing Restrictions: No      Mobility  Bed Mobility Overal bed mobility: Needs Assistance Bed Mobility: Supine  to Sit, Sit to Supine     Supine to sit: Mod assist, HOB elevated Sit to supine: Max assist, +2 for physical assistance   General bed mobility comments: due to small gurney in ED pt with difficulty maneuvering to EOB due to bed habitus, pt gave good effort but ultimately required modA for trunk elevation and to scoot to EOB, maxAx2 return to gurney safely due to high height, pt unable to bring legs up on the bed    Transfers Overall transfer level: Needs assistance Equipment used: Rolling walker (2 wheels) Transfers: Sit to/from Stand Sit to Stand: Min assist, +2 physical assistance           General transfer comment: gurney elevated, patients feet slipping requiring PT and dtr to block them from slipping, used UEs to pull self up more than using legs to push self up    Ambulation/Gait               General Gait Details: side stepped along gurney with modA, increased time, minimal foot clearance and pt leaning over RW.  Stairs            Wheelchair Mobility    Modified Rankin (Stroke Patients Only)       Balance Overall balance assessment: Needs assistance Sitting-balance support: Feet supported, No upper extremity supported Sitting balance-Leahy Scale: Fair     Standing balance support: Bilateral upper extremity supported, Reliant on assistive device for balance, During functional activity Standing balance-Leahy Scale: Poor Standing balance comment: dependent on RW  Pertinent Vitals/Pain Pain Assessment Pain Assessment: No/denies pain    Home Living Family/patient expects to be discharged to:: Skilled nursing facility                   Additional Comments: pt was living at home in 1 story house with ramp to enter however pt family unable to care for her in current state    Prior Function Prior Level of Function : Needs assist             Mobility Comments: pt amb household distances with RW,  up/down ramp with RW, took self to the bathroom ADLs Comments: family assist with bathing of back side and lower body dressing     Hand Dominance   Dominant Hand: Right    Extremity/Trunk Assessment   Upper Extremity Assessment Upper Extremity Assessment: Overall WFL for tasks assessed    Lower Extremity Assessment Lower Extremity Assessment: Generalized weakness    Cervical / Trunk Assessment Cervical / Trunk Assessment: Other exceptions Cervical / Trunk Exceptions: morbidly obese  Communication   Communication: No difficulties  Cognition Arousal/Alertness: Awake/alert Behavior During Therapy: WFL for tasks assessed/performed Overall Cognitive Status: Impaired/Different from baseline                                 General Comments: per daughter she is oriented by still confused which isn't her baseline        General Comments General comments (skin integrity, edema, etc.): VSS    Exercises     Assessment/Plan    PT Assessment Patient needs continued PT services  PT Problem List Decreased strength;Decreased activity tolerance;Decreased balance;Decreased mobility;Decreased knowledge of use of DME;Decreased cognition       PT Treatment Interventions DME instruction;Gait training;Functional mobility training;Therapeutic activities;Therapeutic exercise;Balance training    PT Goals (Current goals can be found in the Care Plan section)  Acute Rehab PT Goals PT Goal Formulation: With patient/family Time For Goal Achievement: 03/15/23 Potential to Achieve Goals: Good    Frequency Min 2X/week     Co-evaluation               AM-PAC PT "6 Clicks" Mobility  Outcome Measure Help needed turning from your back to your side while in a flat bed without using bedrails?: A Lot Help needed moving from lying on your back to sitting on the side of a flat bed without using bedrails?: A Lot Help needed moving to and from a bed to a chair (including a  wheelchair)?: A Lot Help needed standing up from a chair using your arms (e.g., wheelchair or bedside chair)?: A Lot Help needed to walk in hospital room?: Total Help needed climbing 3-5 steps with a railing? : Total 6 Click Score: 10    End of Session Equipment Utilized During Treatment: Gait belt Activity Tolerance: Patient tolerated treatment well Patient left: in bed;with call bell/phone within reach;with family/visitor present Nurse Communication: Mobility status PT Visit Diagnosis: Unsteadiness on feet (R26.81);History of falling (Z91.81);Difficulty in walking, not elsewhere classified (R26.2)    Time: 1610-9604 PT Time Calculation (min) (ACUTE ONLY): 29 min   Charges:   PT Evaluation $PT Eval Moderate Complexity: 1 Mod PT Treatments $Therapeutic Activity: 8-22 mins        Lewis Shock, PT, DPT Acute Rehabilitation Services Secure chat preferred Office #: [phone]   Iona Hansen 03/01/2023, 2:11 PM

## 2023-03-01 NOTE — NC FL2 (Signed)
[place] MEDICAID FL2 LEVEL OF CARE FORM     IDENTIFICATION  Patient Name: Kathleen [name] [place] Birthdate: [birth year] Sex: female Admission Date (Current Location): 02/28/2023  [facility] [place] and [place] Number:  Producer, television/film/video and Address:  [facility] [place], [address] [place], [place] [address]      Provider Number: [id number]  Attending Physician Name and Address:  Default, Provider, MD  Relative Name and Phone Number:  Daejanae Dobey [phone] and [place] [phone]    Current Level of Care: Hospital Recommended Level of Care: Skilled Nursing Facility Prior Approval Number:    Date Approved/Denied:   PASRR Number: [id number]  Discharge Plan: SNF    Current Diagnoses: Patient Active Problem List   Diagnosis Date Noted   Acute metabolic encephalopathy 10/29/2022   Weakness 10/25/2022   HTN (hypertension) 10/24/2022   Acute encephalopathy in setting of covid 19 10/24/2022   COVID-19 virus infection 10/24/2022   Pancytopenia (HCC) 10/24/2022   Pain due to onychomycosis of toenails of both feet 09/10/2022   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   Neuropathic pain 09/23/2017   S/P lumbar fusion 09/23/2017    Orientation RESPIRATION BLADDER Height & Weight     Self, Time, Situation, Place  Normal Continent Weight: 290 lb (131.5 kg) Height:  5\' 3"  (160 cm)  BEHAVIORAL SYMPTOMS/MOOD NEUROLOGICAL BOWEL NUTRITION STATUS      Continent Diet (Heart Healthy)  AMBULATORY STATUS COMMUNICATION OF NEEDS Skin   Limited Assist Verbally Normal                       Personal Care Assistance Level of Assistance  Bathing, Feeding, Dressing Bathing Assistance: Limited assistance Feeding assistance: Independent Dressing Assistance: Limited assistance     Functional Limitations Info  Sight, Hearing, Speech Sight Info: Adequate Hearing Info: Adequate Speech Info: Adequate    SPECIAL CARE FACTORS FREQUENCY  PT (By licensed  PT), OT (By licensed OT)     PT Frequency: 5 times a week OT Frequency: 5 times a week            Contractures Contractures Info: Not present    Additional Factors Info  Code Status, Allergies Code Status Info: Full Code Allergies Info: Aspirin           Current Medications (03/01/2023):  This is the current hospital active medication list Current Facility-Administered Medications  Medication Dose Route Frequency Provider Last Rate Last Admin   atorvastatin (LIPITOR) tablet 10 mg  10 mg Oral Daily Karie Mainland, Amjad, PA-C   10 mg at 03/01/23 1003   clopidogrel (PLAVIX) tablet 75 mg  75 mg Oral Daily Karie Mainland, Amjad, PA-C   75 mg at 03/01/23 1003   DULoxetine (CYMBALTA) DR capsule 60 mg  60 mg Oral Daily Karie Mainland, Amjad, PA-C   60 mg at 03/01/23 1003   enalapril (VASOTEC) tablet 20 mg  20 mg Oral BID Marita Kansas, PA-C   20 mg at 03/01/23 1003   gabapentin (NEURONTIN) capsule 400 mg  400 mg Oral TID Marita Kansas, PA-C   400 mg at 03/01/23 1003   oxyCODONE (Oxy IR/ROXICODONE) immediate release tablet 10 mg  10 mg Oral Q6H PRN Marita Kansas, PA-C       oxyCODONE (OXYCONTIN) 12 hr tablet 30 mg  30 mg Oral Q12H Ali, Amjad, PA-C   30 mg at 03/01/23 1003   polyethylene glycol (MIRALAX / GLYCOLAX) packet 17 g  17 g Oral Daily PRN Karie Mainland,  Amjad, PA-C       Current Outpatient Medications  Medication Sig Dispense Refill   acetaminophen (TYLENOL) 325 MG tablet Take 650 mg by mouth every 6 (six) hours as needed for moderate pain.     ammonium lactate (LAC-HYDRIN) 12 % cream Apply 1 Application topically as needed for dry skin. (Patient taking differently: Apply 1 Application topically daily as needed for dry skin.) 385 g 0   atorvastatin (LIPITOR) 10 MG tablet Take 10 mg by mouth daily.     Cholecalciferol 1000 units tablet Take 1,000 Units by mouth daily.     clopidogrel (PLAVIX) 75 MG tablet Take 75 mg by mouth daily.     docusate sodium (COLACE) 100 MG capsule Take 100 mg by mouth at bedtime.     DULoxetine  (CYMBALTA) 60 MG capsule Take 60 mg by mouth daily.     gabapentin (NEURONTIN) 400 MG capsule Take 400 mg by mouth 3 (three) times daily.     loratadine (CLARITIN) 10 MG tablet Take 10 mg by mouth daily.     metoprolol succinate (TOPROL-XL) 25 MG 24 hr tablet Take 1 tablet (25 mg total) by mouth daily.     Multiple Vitamins-Minerals (MULTIVITAMIN WITH MINERALS) tablet Take 1 tablet by mouth daily.     oxyCODONE (OXY IR/ROXICODONE) 5 MG immediate release tablet Take 1 tablet (5 mg total) by mouth every 6 (six) hours as needed for breakthrough pain. 8 tablet 0   oxyCODONE (OXYCONTIN) 30 MG 12 hr tablet Take 1 tablet (30 mg total) by mouth 2 (two) times daily. 6 tablet 0   vitamin C (ASCORBIC ACID) 500 MG tablet Take 500 mg by mouth 2 (two) times daily.       Discharge Medications: Please see discharge summary for a list of discharge medications.  Relevant Imaging Results:  Relevant Lab Results:   Additional Information SSN#:  [id number]         Ht:  5'3"          Wt:  290 lbs          BMI:  51.37 kg/m2  Nadeem Romanoski C Tarpley-Carter, LCSWA

## 2023-03-01 NOTE — Progress Notes (Signed)
Transition of Care [facility]) - Emergency Department Mini Assessment   Patient Details  Name: Kathleen [name] MRN: [id number] Date of Birth: 11/19/1940  Transition of [facility] CM/SW Contact:    Anjelika Ausburn C Tarpley-Carter, LCSWA Phone Number: 03/01/2023, 9:29 AM   Clinical Narrative: TOC CSW reached out to [facility] on how to proceed with pts SNF placement.  [facility] usually requires approval from the doctor before proceeding with placement.  CSW spoke with Jessica,RN on the nurseline.  Shanda Bumps was unsure how CSW should proceed as she only answers medical questions and contact the MD for urgent questions.  Shanda Bumps advised CSW since the office is currently closed to reach out to [facility] on Monday (03/02/23) for answers.  CSW thanked [place] and call ended.  Nimo Verastegui Tarpley-Carter, MSW, LCSW-A Pronouns:  She/Her/Hers [facility] Clinical Social Worker Direct Number:  [phone] [email or web address]    ED Mini Assessment:    Barriers to Discharge: No Barriers Identified     Means of departure: Not know  Interventions which prevented an admission or readmission: SNF Placement    Patient Contact and Communications Key Contact 1: [facility] [phone]   Spoke with: Shanda Bumps, RN Contact Date: 03/01/23,   Contact time: 2595 Contact Phone Number: [phone] Call outcome: TOC CSW will need to contact [facility] on Monday for information on placement.    CMS Medicare.gov Compare Post Acute Care list provided to:: Patient Choice offered to / list presented to : Adult Children, Patient  Admission diagnosis:  Fever; confusion Patient Active Problem List   Diagnosis Date Noted   Acute metabolic encephalopathy 10/29/2022   Weakness 10/25/2022   HTN (hypertension) 10/24/2022   Acute encephalopathy in setting of covid 19 10/24/2022   COVID-19 virus infection 10/24/2022   Pancytopenia (HCC) 10/24/2022   Pain due to onychomycosis of  toenails of both feet 09/10/2022   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   Neuropathic pain 09/23/2017   S/P lumbar fusion 09/23/2017   PCP:  [facility]:   [id number] - [place], [place] - [address] AT [facility] [address] [place] [address] Phone: [phone] Fax: [phone]

## 2023-03-01 NOTE — Progress Notes (Signed)
TOC CSW introduced myself to pt.  CSW also disclosed to pt that she had spoken with her children in regards to SNF placement.  Pt is in agreeance.  CSW informed pt of the shift change on Monday and a new SW would come speak with her.  Pt expressed understanding.  CSW also disclosed that if she has any concerns or needs to ask for SW.  CSW exited room.  Vaidehi Braddy Tarpley-Carter, MSW, LCSW-A Pronouns:  She/Her/Hers [facility] Clinical Social Worker Direct Number:  [phone] [email or web address]

## 2023-03-01 NOTE — Progress Notes (Signed)
TOC CSW spoke with pts son, Kathleen [name] [phone] and pts daughter, Kathleen [name] [phone].  They both are interested in SNF placement for pt.  Pt fell on  Thursday (02/26/2023), and her ability to wall has declined dramatically per Delanna Ahmadi.  CSW disclosed to pts family that approval from [facility] will have to be obtained first before proceeding with placement.  Pt has been to SNF before.  Pts family would like to seek [facility] (1st choice) and [facility] (2nd choice).  When the family last spoke with [facility] they weren't contracted with [facility], therefore they may not be an option.  CSW assured pts family that this will be looked into.  [facility] is contracted with [facility] per pts family.    Pt at baseline has been walking with a walker or alone in the home prior to fall.  Pt in the past has also used a wheelchair.  Krithika Tome Tarpley-Carter, MSW, LCSW-A Pronouns:  She/Her/Hers [facility] of Care Clinical Social Worker Direct Number:  [phone] [email or web address]

## 2023-03-02 NOTE — ED Provider Notes (Addendum)
82 year old female presented to the ED 2 days ago with concern for confusion. Here in the emergency department patient has appeared stable on physical evaluation and with workup. She has some mild anemia which appears stable from before. CT of the head and cervical spine are negative for acute abnormality  Physical Exam  BP (!) 143/73 (BP Location: Left Arm)   Pulse 78   Temp 98 F (36.7 C) (Oral)   Resp 20   Ht 1.6 m (5\' 3" )   Wt 131.5 kg   SpO2 92%   BMI 51.37 kg/m   Physical Exam  Procedures  Procedures  ED Course / MDM   Clinical Course as of 03/02/23 0902  Sat Feb 28, 2023  1738 This is a very pleasant 82 year old female presented to ED with concern for confusion at home.  Her daughter and her son, whom she lives with, reports that the patient has had waxing waning confusion for several days, confusing events in the house, which is unusual for her.  They have also noted an on and off tremor in her hands for the past month.  The patient was hospitalized for encephalopathy and COVID-19 in February, but the family said typically her her mental status is sharp, and she is not confused or demented.  On my exam the patient is comfortable, has no complaints.  She did have a reported fall at home and is pending CT imaging of her head and cervical spine for trauma eval.  Her chest x-Zyron Deeley does not show any focal infiltrate.  Her blood work is unremarkable, no acute anemia, white blood cell count within normal limits, no evidence of acute dehydration, AKI, electrolyte derangement.  She is pending a UA.  Low suspicion for PE, meningitis, sepsis. [MT]  1919 Workup is unremarkable.  I would anticipate discharge home with family, advising close PCP follow up for reassessment of mental status, and return precautions to ED [MT]    Clinical Course User Index [MT] Trifan, Kermit Balo, MD   Medical Decision Making Amount and/or Complexity of Data Reviewed Labs: ordered. Radiology:  ordered.  Risk Prescription drug management.   82 year old female sent to ED for possible confusion.  She was worked up here in the emergency department and no acute abnormalities were found.  Reportedly she is being assessed for placement and social work note states that they spoke with family and she had fallen on Thursday and her ability to walk has dramatically declined.  [facility] and will need their approval Patient awaiting placement at this time  Patient being placed at [facility], MD 03/02/23 7829    Margarita Grizzle, MD 03/02/23 1054

## 2023-03-02 NOTE — Evaluation (Addendum)
Occupational Therapy Evaluation Patient Details Name: Kathleen [name] MRN: [id number] DOB: 01-Jan-1941 Today's Date: 03/02/2023   History of Present Illness Pt is a 82 yo female who came to ED due to several days of confusion and recent fall. Pt typically able to amb with RW however now unable. PMH: lumbar stenosis, OSA   Clinical Impression   Pt currently at max assist level for simulated selfcare tasks without use of AE.  She also needed mod to max assist for sit to stand off of an elevated bed and for taking 3-4 small steps with the RW.  Currently at home she has her daughter to assist and a HHaide for approximately 4 hours a day (2 in the am and 2 in the pm).  Feel she will benefit from acute care OT at this time in order to help increase selfcare independence, balance, and functional transfers.  Feel post acute atient will benefit from continued inpatient follow up therapy, <3 hours/day.         Recommendations for follow up therapy are one component of a multi-disciplinary discharge planning process, led by the attending physician.  Recommendations may be updated based on patient status, additional functional criteria and insurance authorization.   Assistance Recommended at Discharge Frequent or constant Supervision/Assistance  Patient can return home with the following A lot of help with walking and/or transfers;A lot of help with bathing/dressing/bathroom;Help with stairs or ramp for entrance;Assistance with cooking/housework    Functional Status Assessment  Patient has had a recent decline in their functional status and demonstrates the ability to make significant improvements in function in a reasonable and predictable amount of time.  Equipment Recommendations  Other (comment) (TBD next venue of care)       Precautions / Restrictions Precautions Precautions: Fall Precaution Comments: morbidly obese Restrictions Weight Bearing Restrictions: No      Mobility Bed  Mobility Overal bed mobility: Needs Assistance Bed Mobility: Supine to Sit, Sit to Supine     Supine to sit: HOB elevated, Min assist Sit to supine: Max assist, +2 for physical assistance   General bed mobility comments: Min assist for supine to sit to assist with sitting upright and scooting to the EOB.  Max assist for lifting LEs into the bed when laying down.    Transfers Overall transfer level: Needs assistance Equipment used: Rolling walker (2 wheels) Transfers: Sit to/from Stand, Bed to chair/wheelchair/BSC Sit to Stand: Min assist, Max assist     Step pivot transfers: Mod assist     General transfer comment: Sit to stand from higher surface needed secondary to gripper socks and decreased bilateral knee flexion to position feet up under her more.      Balance Overall balance assessment: Needs assistance   Sitting balance-Leahy Scale: Fair     Standing balance support: Bilateral upper extremity supported, Reliant on assistive device for balance, During functional activity Standing balance-Leahy Scale: Poor Standing balance comment: dependent on RW for standing and mobility                           ADL either performed or assessed with clinical judgement   ADL Overall ADL's : Needs assistance/impaired Eating/Feeding: Independent;Sitting Eating/Feeding Details (indicate cue type and reason): simulated Grooming: Set up;Sitting Grooming Details (indicate cue type and reason): simulated Upper Body Bathing: Supervision/ safety;Sitting Upper Body Bathing Details (indicate cue type and reason): simulated Lower Body Bathing: Maximal assistance;Sit to/from stand Lower Body Bathing Details (indicate  cue type and reason): simulated Upper Body Dressing : Minimal assistance;Sitting Upper Body Dressing Details (indicate cue type and reason): simulated Lower Body Dressing: Maximal assistance;Sit to/from stand   Toilet Transfer: Maximal  assistance;Stand-pivot;BSC/3in1;Rolling walker (2 wheels) Toilet Transfer Details (indicate cue type and reason): simulated Toileting- Clothing Manipulation and Hygiene: Maximal assistance;Sit to/from stand Toileting - Clothing Manipulation Details (indicate cue type and reason): Pt with inablility to perform hygiene.  Reports having AE at home but a lot of the time, she gets assist from family or her aide.     Functional mobility during ADLs: Maximal assistance;Rolling walker (2 wheels) General ADL Comments: Pt with decreased bilateral knee flexion for sit to stand.  Bed had to be elevated and then pt needed mod to max for sit to stand.  She grabbed onto the walker with attempt to pull, even though therapist and son cued her to push up from the bed.  Flexed posture in standing with decreased ability to shift weight efficiently.  She was able to take 3-4 small forward and backwards steps wtih increased time using the RW.  She reports having toilet aide, and sockaide she used at home for toileting and dressing tasks.     Vision Baseline Vision/History: 0 No visual deficits Ability to See in Adequate Light: 0 Adequate Patient Visual Report: No change from baseline Vision Assessment?: No apparent visual deficits     Perception  WFL   Praxis  [facility]    Pertinent Vitals/Pain Pain Assessment Pain Assessment: Faces Faces Pain Scale: Hurts little more Pain Location: back pain Pain Descriptors / Indicators: Discomfort Pain Intervention(s): Limited activity within patient's tolerance, Repositioned, Monitored during session     Hand Dominance Right   Extremity/Trunk Assessment Upper Extremity Assessment Upper Extremity Assessment: Generalized weakness (bilateral shoulder flexion 0-90 degrees.  Decreased internal rotation bilaterally for reaching behind her.  Can get her hand to the side of her back but not far enough for hygiene.)   Lower Extremity Assessment Lower Extremity Assessment: Defer  to PT evaluation   Cervical / Trunk Assessment Cervical / Trunk Assessment: Kyphotic   Communication Communication Communication: No difficulties   Cognition Arousal/Alertness: Awake/alert Behavior During Therapy: WFL for tasks assessed/performed Overall Cognitive Status: Within Functional Limits for tasks assessed                                 General Comments: Oriented to place, day of the week, and month.  Answered PLOF questions accurately.                Home Living Family/patient expects to be discharged to:: Private residence Living Arrangements: Children (lives with daughter)   Type of Home: House Home Access: Ramped entrance     Home Layout: One level     Bathroom Shower/Tub: Sponge bathes at baseline   Bathroom Toilet: Handicapped height     Home Equipment: Agricultural consultant (2 wheels);Wheelchair - manual;BSC/3in1;Hospital bed   Additional Comments: pt was living at home in 1 story house with ramp to enter however pt family unable to care for her in current state.  [facility] that she has 4 hours a day for 5 days a week.  She also [facility] to [facility] Wed-Fri during the day.      Prior Functioning/Environment Prior Level of Function : Needs assist             Mobility Comments: pt amb  household distances with RW, up/down ramp with RW, took self to the bathroom ADLs Comments: Aide assists with bathing and dressing        OT Problem List: Decreased strength;Decreased activity tolerance;Impaired balance (sitting and/or standing);Pain;Obesity      OT Treatment/Interventions: Self-care/ADL training;Patient/family education;Balance training;Therapeutic activities;DME and/or AE instruction    OT Goals(Current goals can be found in the care plan section) Acute Rehab OT Goals Patient Stated Goal: Get back to doing for herself and get to her normal level of function. OT Goal Formulation: With patient Time For Goal Achievement:  03/16/23 Potential to Achieve Goals: Good ADL Goals Pt Will Perform Grooming: with min assist;standing (two tasks at the sink) Pt Will Perform Lower Body Bathing: with min assist;sit to/from stand;with adaptive equipment Pt Will Perform Lower Body Dressing: with min assist;sit to/from stand;with adaptive equipment Pt Will Transfer to Toilet: with min assist;ambulating;bedside commode Pt Will Perform Toileting - Clothing Manipulation and hygiene: with min assist;sit to/from stand  OT Frequency: Min 2X/week       AM-PAC OT "6 Clicks" Daily Activity     Outcome Measure Help from another person eating meals?: None Help from another person taking care of personal grooming?: A Little Help from another person toileting, which includes using toliet, bedpan, or urinal?: A Lot Help from another person bathing (including washing, rinsing, drying)?: A Lot Help from another person to put on and taking off regular upper body clothing?: A Little Help from another person to put on and taking off regular lower body clothing?: A Lot 6 Click Score: 16   End of Session Equipment Utilized During Treatment: Gait belt;Rolling walker (2 wheels) Nurse Communication: Mobility status  Activity Tolerance: Patient tolerated treatment well Patient left: in bed;with call bell/phone within reach  OT Visit Diagnosis: Unsteadiness on feet (R26.81);Muscle weakness (generalized) (M62.81);Repeated falls (R29.6);Pain Pain - part of body:  (back pain in lower back)                Time: 1610-9604 OT Time Calculation (min): 33 min Charges:  OT General Charges $OT Visit: 1 Visit OT Evaluation $OT Eval Moderate Complexity: 1 Mod OT Treatments $Self Care/Home Management : 8-22 mins  Perrin Maltese, OTR/L Acute Rehabilitation Services  Office [phone] 03/02/2023

## 2023-03-02 NOTE — Discharge Planning (Signed)
Licensed Clinical Social Worker is seeking post-discharge placement for this patient at the following level of care: SNF    

## 2023-03-02 NOTE — Progress Notes (Signed)
Patient is discharging to [facility]. CSW verified bed offer with sheila in admissions. Patients daughter Berkley Harvey agrees with plan. CSW reached out to patients social worker Mamie Levers [phone] with [facility]. Marylene Land stated they will provide transportation to get patient over to [place]. CSW is currently waiting for [facility] to finish arrangements with [facility] so patient can discharge.

## 2023-03-02 NOTE — ED Notes (Signed)
Help get patient up to the bedside toilet patient has family at bedside and call bell in reach

## 2023-03-24 ENCOUNTER — Ambulatory Visit: Payer: Medicare (Managed Care) | Admitting: Orthopaedic Surgery

## 2023-07-17 ENCOUNTER — Encounter: Payer: Self-pay | Admitting: Physical Medicine & Rehabilitation

## 2023-08-20 ENCOUNTER — Encounter: Payer: Medicare (Managed Care) | Admitting: Physical Medicine & Rehabilitation

## 2023-09-04 ENCOUNTER — Encounter: Payer: Medicare (Managed Care) | Admitting: Physical Medicine & Rehabilitation

## 2023-10-27 ENCOUNTER — Encounter
Payer: Medicare (Managed Care) | Attending: Physical Medicine & Rehabilitation | Admitting: Physical Medicine & Rehabilitation

## 2023-10-27 ENCOUNTER — Encounter: Payer: Self-pay | Admitting: Physical Medicine & Rehabilitation

## 2023-10-27 VITALS — BP 156/92 | HR 64 | Ht 63.0 in | Wt 284.0 lb

## 2023-10-27 DIAGNOSIS — M24561 Contracture, right knee: Secondary | ICD-10-CM | POA: Insufficient documentation

## 2023-10-27 DIAGNOSIS — M24511 Contracture, right shoulder: Secondary | ICD-10-CM | POA: Insufficient documentation

## 2023-10-27 DIAGNOSIS — M24559 Contracture, unspecified hip: Secondary | ICD-10-CM | POA: Diagnosis not present

## 2023-10-27 DIAGNOSIS — M961 Postlaminectomy syndrome, not elsewhere classified: Secondary | ICD-10-CM | POA: Insufficient documentation

## 2023-10-27 DIAGNOSIS — M24562 Contracture, left knee: Secondary | ICD-10-CM | POA: Diagnosis present

## 2023-10-27 NOTE — Progress Notes (Signed)
Subjective:    Patient ID: Kathleen [name], female    DOB: [birth year], 83 y.o.   MRN: [id number]  HPI  CC:  Left groin pain 83 year old female referred by primary care physician over at [facility] to evaluate for injections to help with patient's spinal pain. 83 year old female with history of posterior spinal fusion T10-11 as well as L2-L5.  This was done at [facility] greater than 5 years ago The thoracic laminectomy was to February 12 /2019 Reviewed epic system no recent imaging studies performed The patient reportedly still walks a couple steps from her wheelchair to the toilet with physical assistance The patient's primary pain complaint is her left groin.  She states this is worse with standing but also has pain with sitting. She has no new numbness or tingling in the lower extremities or new bowel or bladder incontinence. She is on chronic narcotics including Oxy Contin 20 mg twice daily as well as oxycodone immediate release 5 mg twice daily Some knee pain bilateral   Walks with a walker going to the rest room Has an aide to help her dress Aide helps her bathe, sponge bath No hip or knee surgery in the past Last fall a few months ago  Pain Inventory Average Pain 8 Pain Right Now 5 My pain is constant, sharp, burning, dull, stabbing, tingling, and aching  In the last 24 hours, has pain interfered with the following? General activity 3 Relation with others 4 Enjoyment of life 4 What TIME of day is your pain at its worst? morning , daytime, evening, and night Sleep (in general) Good  Pain is worse with: some activites Pain improves with: medication Relief from Meds: 7  walk with assistance use a walker how many minutes can you walk? 2-3 ability to climb steps?  yes do you drive?  no use a wheelchair needs help with transfers  retired I need assistance with the following:  dressing, bathing, toileting, meal prep, household duties, and  shopping  numbness tingling trouble walking spasms confusion  Any changes since last visit?  no  Any changes since last visit?  no    Family History  Problem Relation Age of Onset   Breast cancer Neg Hx    Social History   Socioeconomic History   Marital status: Divorced    Spouse name: Not on file   Number of children: Not on file   Years of education: Not on file   Highest education level: Not on file  Occupational History   Not on file  Tobacco Use   Smoking status: Unknown   Smokeless tobacco: Not on file  Vaping Use   Vaping status: Unknown  Substance and Sexual Activity   Alcohol use: Never   Drug use: Not on file   Sexual activity: Not on file  Other Topics Concern   Not on file  Social History Narrative   Not on file   Social Drivers of Health   Financial Resource Strain: Not on file  Food Insecurity: Not on file  Transportation Needs: Not on file  Physical Activity: Not on file  Stress: Not on file  Social Connections: Not on file   No past surgical history on file. Past Medical History:  Diagnosis Date   Ambulatory dysfunction    Arthritis    Hypertension    Spinal stenosis    BP (!) 156/92   Pulse 64   Ht 5\' 3"  (1.6 m)   Wt 284 lb (128.8  kg) Comment: reported  SpO2 95%   BMI 50.31 kg/m   Opioid Risk Score:   Fall Risk Score:  `1  Depression screen [facility] 2/9     10/27/2023    2:29 PM  Depression screen PHQ 2/9  Decreased Interest 0  Down, Depressed, Hopeless 0  PHQ - 2 Score 0  Altered sleeping 0  Tired, decreased energy 0  Change in appetite 0  Feeling bad or failure about yourself  0  Trouble concentrating 0  Moving slowly or fidgety/restless 0  Suicidal thoughts 0  PHQ-9 Score 0     Review of Systems  Musculoskeletal:  Positive for back pain and gait problem.  Neurological:  Positive for numbness.       Tingling  All other systems reviewed and are negative.      Objective:   Physical Exam Vitals reviewed.   Constitutional:      Appearance: Normal appearance.  HENT:     Head: Normocephalic and atraumatic.  Eyes:     Extraocular Movements: Extraocular movements intact.     Conjunctiva/sclera: Conjunctivae normal.     Pupils: Pupils are equal, round, and reactive to light.  Neurological:     General: No focal deficit present.     Mental Status: She is alert.     Comments: Patient is a poor historian she minimizes her deficits she states that she can get in the shower but her son corrects that she requires sponge baths Motor strength within the limits of her range of motion is 4/5 in the left deltoid by stress of grip 3 - right deltoid 4 - bicep tricep grip on the right side 3 - hip flexors due to range of motion issues 4 - knee extension 3 - ankle dorsiflexion bilaterally Sensation to light touch is equal in the lower extremities  Psychiatric:        Mood and Affect: Mood normal.   Musculoskeletal Right shoulder has 90 degrees of abduction and forward flexion.  Left hip has 0 internal extra rotation knee is limited to 90 degrees of flexion on the left and to 70 degrees of flexion on the right there is reduced right hip internal extra rotation        Assessment & Plan:   1.  History of lumbar and thoracic laminectomy syndrome this is not her major complaint she does not have any signs or symptoms of cauda equina syndrome at this time 2.  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  Right tricompartmental knee osteoarthritis rated as severe on a film dated 10/23/2022 Lumbar x-ray from 01/02/2005 demonstrated L2-L5 fusion indicating that this procedure was done at least 19 years ago Discussed with the patient's son that given the severe degree of osteoarthritis in multiple joints do not think the patient could get on the fluoroscopy table even with assistance.  Therefore she is not a candidate for either  intra-articular hip injections or lumbar spine injections.  We discussed the possibility of knee injections however given the severity of her arthritis these are likely to be of long-term benefit. She will follow-up with her primary care physician for her pain medication.  No other recommendations at this time

## 2023-10-27 NOTE — Patient Instructions (Signed)
Diclofenac Gel What is this medication? DICLOFENAC (dye KLOE fen ak) treats joint pain caused by arthritis. It works by decreasing inflammation. It belongs to a group of medications called NSAIDs. This medicine may be used for other purposes; ask your health care provider or pharmacist if you have questions. COMMON BRAND NAME(S): Aspercreme Arthritis Pain Reliever, Diclofono, DICLOPREP, DSG Pak, Motrin Arthritis Pain, Omeca, Solaravix, Solaraze, ValcoPrep-100, VennGel One, Voltaren Arthritis, Voltaren Gel What should I tell my care team before I take this medication? They need to know if you have any of these conditions: Bleeding disorder Coronary artery bypass graft (CABG) within the past 2 weeks Frequently drink alcohol Heart attack Heart disease Heart failure High blood pressure Kidney disease Large areas of burned or damaged skin Liver disease Low red blood cell levels Lung or breathing disease, such as asthma Receiving steroids like dexamethasone or prednisone Skin conditions or sensitivity Stomach bleeding Stomach or intestine problems Take medications that treat or prevent blood clots Tobacco use An unusual or allergic reaction to diclofenac, other medications, foods, dyes, or preservatives Pregnant or trying to get pregnant Breastfeeding How should I use this medication? This medication is for external use only. Do not take by mouth. Wash your hands before and after use. If you are treating your hands, only wash your hands before use. Do not get it in your eyes. If you do, rinse your eyes with plenty of cool tap water. Use it as directed on the prescription label at the same time every day. Do not use it more often than directed. Keep taking it unless your care team tells you to stop. Apply a thin film of the medication to the affected area. If you are using Voltaren or Venngel One, they come with INSTRUCTIONS FOR USE. Ask your pharmacist for directions on how to use this  medication. Read the information carefully. Talk to your pharmacist or care team if you have questions. A special MedGuide will be given to you by the pharmacist with each prescription and refill. Be sure to read this information carefully each time. Talk to your care team about the use of this medication in children. Special care may be needed. Overdosage: If you think you have taken too much of this medicine contact a poison control center or emergency room at once. NOTE: This medicine is only for you. Do not share this medicine with others. What if I miss a dose? If you are using Voltaren or Venngel One: If you miss a dose, skip it. Use your next dose at the normal time. Do not use extra or 2 doses at the same time to make up for the missed dose. If you are using Solaraze: If you miss a dose, use it as soon as you can. If it is almost time for your next dose, use only that dose. Do not use double or extra doses. What may interact with this medication? Aspirin NSAIDs, medications for pain and inflammation, like ibuprofen or naproxen This list may not describe all possible interactions. Give your health care provider a list of all the medicines, herbs, non-prescription drugs, or dietary supplements you use. Also tell them if you smoke, drink alcohol, or use illegal drugs. Some items may interact with your medicine. What should I watch for while using this medication? Visit your care team for regular checks on your progress. Tell your care team if your symptoms do not start to get better or if they get worse. Do not take other medications that  contain aspirin, ibuprofen, or naproxen with this medication. Side effects such as stomach upset, nausea, or ulcers may be more likely to occur. Many non-prescription medications contain aspirin, ibuprofen, or naproxen. Always read labels carefully. If you have questions, ask your care team. This medication can cause serious ulcers and bleeding in the stomach.  It can happen with no warning. Tobacco, alcohol, older age, and poor health can also increase risks. Call your care team right away if you have stomach pain or blood in your vomit or stool. This medication does not prevent a heart attack or stroke. This medication may increase the chance of a heart attack or stroke. The chance may increase the longer you use this medication or if you have heart disease. If you take aspirin to prevent a heart attack or stroke, talk to your care team about using this medication. This medication may cause serious skin reactions. They can happen weeks to months after starting the medication. Contact your care team right away if you notice fevers or flu-like symptoms with a rash. The rash may be red or purple and then turn into blisters or peeling of the skin. You may also notice a red rash with swelling of the face, lips, or lymph nodes in your neck or under your arms. Talk to your care team if you wish to become pregnant or think you might be pregnant. This medication can cause serious birth defects if taken during pregnancy. This medication may affect your coordination, reaction time, or judgment. Do not drive or operate machinery until you know how this medication affects you. Sit up or stand slowly to reduce the risk of dizzy or fainting spells. Drinking alcohol with this medication can increase the risk of these side effects. Be careful brushing or flossing your teeth or using a toothpick because you may get an infection or bleed more easily. If you have any dental work done, tell your dentist you are receiving this medication. This medication may make it more difficult to get pregnant. Talk to your care team if you are concerned about your fertility. What side effects may I notice from receiving this medication? Side effects that you should report to your care team as soon as possible: Allergic reactions--skin rash, itching, hives, swelling of the face, lips, tongue, or  throat Bleeding--bloody or black, tar-like stools, vomiting blood or brown material that looks like coffee grounds, red or dark brown urine, small red or purple spots on skin, unusual bruising or bleeding Heart attack--pain or tightness in the chest, shoulders, arms, or jaw, nausea, shortness of breath, cold or clammy skin, feeling faint or lightheaded Heart failure--shortness of breath, swelling of ankles, feet, or hands, sudden weight gain, unusual weakness or fatigue Increase in blood pressure Kidney injury--decrease in the amount of urine, swelling of the ankles, hands, or feet Liver injury--right upper belly pain, loss of appetite, nausea, light-colored stool, dark yellow or brown urine, yellowing skin or eyes, unusual weakness or fatigue Rash, fever, and swollen lymph nodes Redness, blistering, peeling, or loosening of the skin, including inside the mouth Stroke--sudden numbness or weakness of the face, arm, or leg, trouble speaking, confusion, trouble walking, loss of balance or coordination, dizziness, severe headache, change in vision Side effects that usually do not require medical attention (report to your care team if they continue or are bothersome): Headache Irritation at application site Loss of appetite Nausea Upset stomach This list may not describe all possible side effects. Call your doctor for medical advice about  side effects. You may report side effects to FDA at 1-800-FDA-1088. Where should I keep my medication? Keep out of the reach of children and pets. Store at room temperature between 15 and 30 degrees C (59 and 86 degrees F). Do not freeze. Protect from heat. Get rid of any unused medication after the expiration date. To get rid of medications that are no longer needed or have expired: Take the medication to a medication take-back program. Check with your pharmacy or law enforcement to find a location. If you cannot return the medication, check the label or package  insert to see if the medication should be thrown out in the garbage or flushed down the toilet. If you are not sure, ask your care team. If it is safe to put it in the trash, empty the medication out of the container. Mix the medication with cat litter, dirt, coffee grounds, or other unwanted substance. Seal the mixture in a bag or container. Put it in the trash. NOTE: This sheet is a summary. It may not cover all possible information. If you have questions about this medicine, talk to your doctor, pharmacist, or health care provider.  2024 [employer] Standard (2022-06-12 00:00:00)

## 2023-12-31 ENCOUNTER — Other Ambulatory Visit: Payer: Self-pay | Admitting: Internal Medicine

## 2023-12-31 DIAGNOSIS — Z1231 Encounter for screening mammogram for malignant neoplasm of breast: Secondary | ICD-10-CM

## 2024-01-21 ENCOUNTER — Ambulatory Visit
Admission: RE | Admit: 2024-01-21 | Discharge: 2024-01-21 | Disposition: A | Discharge status: Home / Self Care | Payer: Medicare (Managed Care) | Source: Ambulatory Visit | Attending: Internal Medicine | Admitting: Internal Medicine

## 2024-01-21 DIAGNOSIS — Z1231 Encounter for screening mammogram for malignant neoplasm of breast: Secondary | ICD-10-CM

## 2024-03-02 ENCOUNTER — Ambulatory Visit
Admission: RE | Admit: 2024-03-02 | Discharge: 2024-03-02 | Disposition: A | Discharge status: Home / Self Care | Payer: Medicare (Managed Care) | Source: Ambulatory Visit | Attending: Internal Medicine | Admitting: Internal Medicine

## 2024-03-02 ENCOUNTER — Other Ambulatory Visit: Payer: Self-pay | Admitting: Internal Medicine

## 2024-03-02 DIAGNOSIS — M25552 Pain in left hip: Secondary | ICD-10-CM

## 2024-06-30 ENCOUNTER — Other Ambulatory Visit (HOSPITAL_BASED_OUTPATIENT_CLINIC_OR_DEPARTMENT_OTHER): Payer: Self-pay | Admitting: Physician Assistant

## 2024-06-30 DIAGNOSIS — E559 Vitamin D deficiency, unspecified: Secondary | ICD-10-CM

## 2024-08-19 ENCOUNTER — Ambulatory Visit: Payer: Medicare (Managed Care)

## 2024-08-19 DIAGNOSIS — E559 Vitamin D deficiency, unspecified: Secondary | ICD-10-CM | POA: Diagnosis not present

## 2024-11-15 ENCOUNTER — Other Ambulatory Visit (HOSPITAL_BASED_OUTPATIENT_CLINIC_OR_DEPARTMENT_OTHER): Payer: Medicare (Managed Care)
